# Patient Record
Sex: Female | Born: 1935 | Race: White | Hispanic: No | Smoking: Former smoker
Health system: Southern US, Community
[De-identification: ages and names within clinical notes are randomized; demographics above are authoritative.]

## PROBLEM LIST (undated history)

## (undated) DIAGNOSIS — D649 Anemia, unspecified: Secondary | ICD-10-CM

## (undated) DIAGNOSIS — K219 Gastro-esophageal reflux disease without esophagitis: Secondary | ICD-10-CM

## (undated) DIAGNOSIS — I10 Essential (primary) hypertension: Secondary | ICD-10-CM

## (undated) DIAGNOSIS — K529 Noninfective gastroenteritis and colitis, unspecified: Secondary | ICD-10-CM

## (undated) DIAGNOSIS — E785 Hyperlipidemia, unspecified: Secondary | ICD-10-CM

## (undated) DIAGNOSIS — IMO0001 Reserved for inherently not codable concepts without codable children: Secondary | ICD-10-CM

## (undated) DIAGNOSIS — K5792 Diverticulitis of intestine, part unspecified, without perforation or abscess without bleeding: Secondary | ICD-10-CM

## (undated) DIAGNOSIS — J189 Pneumonia, unspecified organism: Secondary | ICD-10-CM

## (undated) DIAGNOSIS — G459 Transient cerebral ischemic attack, unspecified: Secondary | ICD-10-CM

## (undated) DIAGNOSIS — J45909 Unspecified asthma, uncomplicated: Secondary | ICD-10-CM

## (undated) DIAGNOSIS — J302 Other seasonal allergic rhinitis: Secondary | ICD-10-CM

## (undated) DIAGNOSIS — C189 Malignant neoplasm of colon, unspecified: Secondary | ICD-10-CM

## (undated) DIAGNOSIS — Z9889 Other specified postprocedural states: Secondary | ICD-10-CM

## (undated) DIAGNOSIS — K449 Diaphragmatic hernia without obstruction or gangrene: Secondary | ICD-10-CM

## (undated) DIAGNOSIS — J9611 Chronic respiratory failure with hypoxia: Secondary | ICD-10-CM

## (undated) DIAGNOSIS — M1990 Unspecified osteoarthritis, unspecified site: Secondary | ICD-10-CM

## (undated) DIAGNOSIS — H9190 Unspecified hearing loss, unspecified ear: Secondary | ICD-10-CM

## (undated) DIAGNOSIS — R112 Nausea with vomiting, unspecified: Secondary | ICD-10-CM

## (undated) DIAGNOSIS — H269 Unspecified cataract: Secondary | ICD-10-CM

## (undated) DIAGNOSIS — Z8489 Family history of other specified conditions: Secondary | ICD-10-CM

## (undated) DIAGNOSIS — I82409 Acute embolism and thrombosis of unspecified deep veins of unspecified lower extremity: Secondary | ICD-10-CM

## (undated) DIAGNOSIS — Z87442 Personal history of urinary calculi: Secondary | ICD-10-CM

## (undated) DIAGNOSIS — E119 Type 2 diabetes mellitus without complications: Secondary | ICD-10-CM

## (undated) HISTORY — DX: Other seasonal allergic rhinitis: J30.2

## (undated) HISTORY — DX: Hyperlipidemia, unspecified: E78.5

## (undated) HISTORY — PX: TONSILLECTOMY: SUR1361

## (undated) HISTORY — DX: Unspecified osteoarthritis, unspecified site: M19.90

## (undated) HISTORY — PX: EYE SURGERY: SHX253

## (undated) HISTORY — PX: TOTAL ABDOMINAL HYSTERECTOMY: SHX209

## (undated) HISTORY — PX: CHOLECYSTECTOMY: SHX55

## (undated) HISTORY — PX: JOINT REPLACEMENT: SHX530

## (undated) HISTORY — PX: APPENDECTOMY: SHX54

## (undated) HISTORY — DX: Diaphragmatic hernia without obstruction or gangrene: K44.9

## (undated) HISTORY — DX: Transient cerebral ischemic attack, unspecified: G45.9

## (undated) HISTORY — DX: Malignant neoplasm of colon, unspecified: C18.9

## (undated) HISTORY — DX: Unspecified asthma, uncomplicated: J45.909

## (undated) HISTORY — DX: Diverticulitis of intestine, part unspecified, without perforation or abscess without bleeding: K57.92

## (undated) HISTORY — DX: Essential (primary) hypertension: I10

## (undated) HISTORY — DX: Gastro-esophageal reflux disease without esophagitis: K21.9

---

## 1964-08-12 HISTORY — PX: KIDNEY SURGERY: SHX687

## 1965-08-12 HISTORY — PX: COLON RESECTION: SHX5231

## 1998-06-26 ENCOUNTER — Ambulatory Visit (HOSPITAL_COMMUNITY): Admission: RE | Admit: 1998-06-26 | Discharge: 1998-06-26 | Payer: Self-pay | Admitting: Obstetrics & Gynecology

## 2003-09-28 ENCOUNTER — Ambulatory Visit (HOSPITAL_COMMUNITY): Admission: RE | Admit: 2003-09-28 | Discharge: 2003-09-28 | Payer: Self-pay | Admitting: Urology

## 2012-11-23 ENCOUNTER — Other Ambulatory Visit: Payer: Self-pay | Admitting: *Deleted

## 2014-12-24 ENCOUNTER — Emergency Department (HOSPITAL_COMMUNITY): Payer: Medicare Other

## 2014-12-24 ENCOUNTER — Emergency Department (HOSPITAL_COMMUNITY)
Admission: EM | Admit: 2014-12-24 | Discharge: 2014-12-24 | Disposition: A | Discharge status: Home / Self Care | Payer: Medicare Other | Source: Intra-hospital | Attending: Emergency Medicine | Admitting: Emergency Medicine

## 2014-12-24 ENCOUNTER — Encounter (HOSPITAL_COMMUNITY): Payer: Self-pay | Admitting: *Deleted

## 2014-12-24 DIAGNOSIS — F1110 Opioid abuse, uncomplicated: Secondary | ICD-10-CM | POA: Diagnosis not present

## 2014-12-24 DIAGNOSIS — E119 Type 2 diabetes mellitus without complications: Secondary | ICD-10-CM | POA: Insufficient documentation

## 2014-12-24 DIAGNOSIS — I10 Essential (primary) hypertension: Secondary | ICD-10-CM | POA: Insufficient documentation

## 2014-12-24 DIAGNOSIS — R51 Headache: Secondary | ICD-10-CM

## 2014-12-24 DIAGNOSIS — Z8669 Personal history of other diseases of the nervous system and sense organs: Secondary | ICD-10-CM | POA: Insufficient documentation

## 2014-12-24 DIAGNOSIS — R519 Headache, unspecified: Secondary | ICD-10-CM

## 2014-12-24 HISTORY — DX: Type 2 diabetes mellitus without complications: E11.9

## 2014-12-24 HISTORY — DX: Reserved for inherently not codable concepts without codable children: IMO0001

## 2014-12-24 HISTORY — DX: Unspecified cataract: H26.9

## 2014-12-24 HISTORY — DX: Essential (primary) hypertension: I10

## 2014-12-24 HISTORY — DX: Unspecified hearing loss, unspecified ear: H91.90

## 2014-12-24 LAB — COMPREHENSIVE METABOLIC PANEL
ALK PHOS: 71 U/L (ref 38–126)
ALT: 11 U/L — AB (ref 14–54)
ANION GAP: 8 (ref 5–15)
AST: 20 U/L (ref 15–41)
Albumin: 3.8 g/dL (ref 3.5–5.0)
BUN: 13 mg/dL (ref 6–20)
CO2: 32 mmol/L (ref 22–32)
Calcium: 9 mg/dL (ref 8.9–10.3)
Chloride: 94 mmol/L — ABNORMAL LOW (ref 101–111)
Creatinine, Ser: 0.72 mg/dL (ref 0.44–1.00)
GLUCOSE: 104 mg/dL — AB (ref 65–99)
Potassium: 4.4 mmol/L (ref 3.5–5.1)
Sodium: 134 mmol/L — ABNORMAL LOW (ref 135–145)
TOTAL PROTEIN: 6.7 g/dL (ref 6.5–8.1)
Total Bilirubin: 0.7 mg/dL (ref 0.3–1.2)

## 2014-12-24 LAB — URINALYSIS, ROUTINE W REFLEX MICROSCOPIC
Bilirubin Urine: NEGATIVE
Glucose, UA: NEGATIVE mg/dL
HGB URINE DIPSTICK: NEGATIVE
Ketones, ur: NEGATIVE mg/dL
Nitrite: NEGATIVE
PH: 7 (ref 5.0–8.0)
Protein, ur: NEGATIVE mg/dL
SPECIFIC GRAVITY, URINE: 1.014 (ref 1.005–1.030)
Urobilinogen, UA: 0.2 mg/dL (ref 0.0–1.0)

## 2014-12-24 LAB — I-STAT CHEM 8, ED
BUN: 16 mg/dL (ref 6–20)
CHLORIDE: 91 mmol/L — AB (ref 101–111)
Calcium, Ion: 1.15 mmol/L (ref 1.13–1.30)
Creatinine, Ser: 0.8 mg/dL (ref 0.44–1.00)
Glucose, Bld: 106 mg/dL — ABNORMAL HIGH (ref 65–99)
HEMATOCRIT: 42 % (ref 36.0–46.0)
Hemoglobin: 14.3 g/dL (ref 12.0–15.0)
POTASSIUM: 4.4 mmol/L (ref 3.5–5.1)
SODIUM: 134 mmol/L — AB (ref 135–145)
TCO2: 30 mmol/L (ref 0–100)

## 2014-12-24 LAB — URINE MICROSCOPIC-ADD ON

## 2014-12-24 LAB — CBC
HEMATOCRIT: 39 % (ref 36.0–46.0)
Hemoglobin: 13 g/dL (ref 12.0–15.0)
MCH: 30.2 pg (ref 26.0–34.0)
MCHC: 33.3 g/dL (ref 30.0–36.0)
MCV: 90.5 fL (ref 78.0–100.0)
PLATELETS: 171 10*3/uL (ref 150–400)
RBC: 4.31 MIL/uL (ref 3.87–5.11)
RDW: 14.5 % (ref 11.5–15.5)
WBC: 7.6 10*3/uL (ref 4.0–10.5)

## 2014-12-24 LAB — RAPID URINE DRUG SCREEN, HOSP PERFORMED
Amphetamines: NOT DETECTED
BARBITURATES: NOT DETECTED
Benzodiazepines: NOT DETECTED
Cocaine: NOT DETECTED
Opiates: POSITIVE — AB
Tetrahydrocannabinol: NOT DETECTED

## 2014-12-24 LAB — DIFFERENTIAL
BASOS ABS: 0 10*3/uL (ref 0.0–0.1)
Basophils Relative: 0 % (ref 0–1)
Eosinophils Absolute: 0.1 10*3/uL (ref 0.0–0.7)
Eosinophils Relative: 1 % (ref 0–5)
Lymphocytes Relative: 23 % (ref 12–46)
Lymphs Abs: 1.8 10*3/uL (ref 0.7–4.0)
MONO ABS: 0.6 10*3/uL (ref 0.1–1.0)
MONOS PCT: 8 % (ref 3–12)
Neutro Abs: 5.1 10*3/uL (ref 1.7–7.7)
Neutrophils Relative %: 68 % (ref 43–77)

## 2014-12-24 LAB — PROTIME-INR
INR: 0.99 (ref 0.00–1.49)
Prothrombin Time: 13.2 seconds (ref 11.6–15.2)

## 2014-12-24 LAB — APTT: aPTT: 24 seconds (ref 24–37)

## 2014-12-24 LAB — SEDIMENTATION RATE: SED RATE: 22 mm/h (ref 0–22)

## 2014-12-24 LAB — I-STAT TROPONIN, ED: TROPONIN I, POC: 0.01 ng/mL (ref 0.00–0.08)

## 2014-12-24 LAB — ETHANOL

## 2014-12-24 MED ORDER — DIPHENHYDRAMINE HCL 50 MG/ML IJ SOLN
12.5000 mg | Freq: Once | INTRAMUSCULAR | Status: AC
  Administered 2014-12-24: 12.5 mg via INTRAVENOUS
  Filled 2014-12-24: qty 1

## 2014-12-24 MED ORDER — DEXAMETHASONE SODIUM PHOSPHATE 10 MG/ML IJ SOLN
10.0000 mg | Freq: Once | INTRAMUSCULAR | Status: AC
  Administered 2014-12-24: 10 mg via INTRAVENOUS
  Filled 2014-12-24: qty 1

## 2014-12-24 MED ORDER — SODIUM CHLORIDE 0.9 % IV BOLUS (SEPSIS)
500.0000 mL | Freq: Once | INTRAVENOUS | Status: AC
  Administered 2014-12-24: 500 mL via INTRAVENOUS

## 2014-12-24 MED ORDER — METOCLOPRAMIDE HCL 5 MG/ML IJ SOLN
5.0000 mg | Freq: Once | INTRAMUSCULAR | Status: AC
  Administered 2014-12-24: 5 mg via INTRAVENOUS
  Filled 2014-12-24: qty 2

## 2014-12-24 NOTE — ED Notes (Signed)
Consent signed by pt to retrieve [facility] patient information.

## 2014-12-24 NOTE — ED Notes (Signed)
Received pt from [facility] via EMS with change in speech around 0930 and headache. Pt had nausea and vomiting and given 4 mg of Zofran by EMS.

## 2014-12-24 NOTE — ED Provider Notes (Signed)
CSN: [id number]     Arrival date & time 12/24/14  1339 History   First MD Initiated Contact with Patient 12/24/14 1350     Chief Complaint  Patient presents with  . Code Stroke    An emergency department physician performed an initial assessment on this suspected stroke patient at 1339. (Consider location/radiation/quality/duration/timing/severity/associated sxs/prior Treatment) Patient is a 81 y.o. female presenting with neurologic complaint and headaches. The history is provided by the patient.  Neurologic Problem This is a new problem. The current episode started less than 1 hour ago. The problem occurs constantly. The problem has been resolved. Associated symptoms include headaches. Pertinent negatives include no chest pain, no abdominal pain and no shortness of breath. Nothing aggravates the symptoms. Nothing relieves the symptoms. She has tried nothing for the symptoms. The treatment provided no relief.  Headache Pain location:  Frontal Quality:  Dull Radiates to:  Does not radiate Severity currently:  6/10 Severity at highest:  9/10 Onset quality:  Gradual Duration:  1 week Timing:  Intermittent Progression:  Waxing and waning Chronicity:  New Context comment:  At rest Relieved by:  Nothing Worsened by:  Nothing Ineffective treatments:  NSAIDs Associated symptoms: no abdominal pain, no back pain, no congestion, no cough, no diarrhea, no dizziness, no eye pain, no fatigue, no fever, no nausea, no neck pain and no vomiting     Past Medical History  Diagnosis Date  . Diabetes mellitus without complication   . Hypertension   . Cataract   . Hearing impaired    No past surgical history on file. No family history on file. History  Substance Use Topics  . Smoking status: Not on file  . Smokeless tobacco: Not on file  . Alcohol Use: Not on file   OB History    No data available     Review of Systems  Constitutional: Negative for fever and fatigue.  HENT: Negative for  congestion and drooling.   Eyes: Negative for pain.  Respiratory: Negative for cough and shortness of breath.   Cardiovascular: Negative for chest pain.  Gastrointestinal: Negative for nausea, vomiting, abdominal pain and diarrhea.  Genitourinary: Negative for dysuria and hematuria.  Musculoskeletal: Negative for back pain, gait problem and neck pain.  Skin: Negative for color change.  Neurological: Positive for speech difficulty and headaches. Negative for dizziness.  Hematological: Negative for adenopathy.  Psychiatric/Behavioral: Negative for behavioral problems.  All other systems reviewed and are negative.     Allergies  Codeine; Morphine and related; and Other  Home Medications   Prior to Admission medications   Not on File   Wt 138 lb 7.2 oz (62.8 kg)  SpO2 98% Physical Exam  Constitutional: She is oriented to person, place, and time. She appears well-developed and well-nourished.  HENT:  Head: Normocephalic and atraumatic.  Mouth/Throat: Oropharynx is clear and moist. No oropharyngeal exudate.  Eyes: Conjunctivae and EOM are normal. Pupils are equal, round, and reactive to light.  Neck: Normal range of motion. Neck supple.  Cardiovascular: Normal rate, regular rhythm, normal heart sounds and intact distal pulses.  Exam reveals no gallop and no friction rub.   No murmur heard. Pulmonary/Chest: Effort normal and breath sounds normal. No respiratory distress. She has no wheezes.  Abdominal: Soft. Bowel sounds are normal. There is no tenderness. There is no rebound and no guarding.  Musculoskeletal: Normal range of motion. She exhibits no edema or tenderness.  Neurological: She is alert and oriented to person, place, and time.  Equal strength and sensation in the upper extremity.  Normal sensation in bilateral lower extremity. 5 out of 5 strength in the left lower extremity. 3 out of 5 strength in the right lower extremity which the patient attributes to chronic hip  pain.  Skin: Skin is warm and dry.  Psychiatric: She has a normal mood and affect. Her behavior is normal.  Nursing note and vitals reviewed.   ED Course  Procedures (including critical care time) Labs Review Labs Reviewed  COMPREHENSIVE METABOLIC PANEL - Abnormal; Notable for the following:    Sodium 134 (*)    Chloride 94 (*)    Glucose, Bld 104 (*)    ALT 11 (*)    All other components within normal limits  I-STAT CHEM 8, ED - Abnormal; Notable for the following:    Sodium 134 (*)    Chloride 91 (*)    Glucose, Bld 106 (*)    All other components within normal limits  ETHANOL  PROTIME-INR  APTT  CBC  DIFFERENTIAL  URINE RAPID DRUG SCREEN (HOSP PERFORMED)  URINALYSIS, ROUTINE W REFLEX MICROSCOPIC  SEDIMENTATION RATE  I-STAT TROPOININ, ED    Imaging Review Ct Head Wo Contrast  12/24/2014   CLINICAL DATA:  Severe headache, slurred speech.  EXAM: CT HEAD WITHOUT CONTRAST  TECHNIQUE: Contiguous axial images were obtained from the base of the skull through the vertex without intravenous contrast.  COMPARISON:  CT scan of Dec 21, 2014.  FINDINGS: Bony calvarium appears intact. Minimal diffuse cortical atrophy is noted. Mild chronic ischemic white matter disease is noted. No mass effect or midline shift is noted. Ventricular size is within normal limits. There is no evidence of mass lesion, hemorrhage or acute infarction.  IMPRESSION: Minimal diffuse cortical atrophy. Mild chronic ischemic white matter disease. No acute intracranial abnormality seen. These results were called by telephone at the time of interpretation on 12/24/2014 at 2:04 pm to Dr. Leonel Ramsay, who verbally acknowledged these results.   Electronically Signed   By: Marijo Conception, M.D.   On: 12/24/2014 14:04     EKG Interpretation   Date/Time:  Saturday Dec 24 2014 14:08:22 EDT Ventricular Rate:  67 PR Interval:  158 QRS Duration: 90 QT Interval:  412 QTC Calculation: 435 R Axis:   54 Text Interpretation:   Sinus rhythm Anterior infarct, old No significant  change since last tracing Confirmed by Fergie Sherbert  MD, Tong Pieczynski ([id number]) on  12/24/2014 2:34:06 PM      MDM   Final diagnoses:  Headache, unspecified headache type    2:38 PM 81 y.o. female w hx of DM, HTN, hx of HA's, on 2L [place] at baseline, TIA on aggrenox who presents with waxing waning frontal headaches for the last week and expressive aphasia. She was recently admitted to [facility] several days ago due to some expressive aphasia which developed while sitting at dinner. Her symptoms were brief and seemed to resolve by the time she arrived to the hospital. She did not get an MRI due to a metal pessary that she did not want removed. She states that she awoke last night with this recurring frontal headache that continued throughout this morning. EMS was called due to worsening headache but noted the patient had some mild expressive aphasia. There is no evidence of this on my current exam. She has been evaluated by neurology. We'll get screening labs and imaging. She denies fevers or head injuries. Does have a hx of HA's.   ESR neg.  Doubt temporal arteritis. I discussed the case with Dr. Leonel Ramsay. The patient's headache is resolving with a migraine cocktail. He recommended discontinuing the Aggrenox as it may be provoking the headaches. We'll start her on full dose aspirin daily. Will recommend close follow-up with her neurologist for further evaluation of her headaches. Do not think admission needed given recent stroke workup at [facility].  4:16 PM:  I have discussed the diagnosis/risks/treatment options with the patient and family and believe the pt to be eligible for discharge home to follow-up with her neurologist. We also discussed returning to the ED immediately if new or worsening sx occur. We discussed the sx which are most concerning (e.g., weakness, numbness, fever, worsening HA's) that necessitate immediate return.  Medications administered to the patient during their visit and any new prescriptions provided to the patient are listed below.  Medications given during this visit Medications  sodium chloride 0.9 % bolus 500 mL (500 mLs Intravenous New Bag/Given 12/24/14 1444)  metoCLOPramide (REGLAN) injection 5 mg (5 mg Intravenous Given 12/24/14 1446)  diphenhydrAMINE (BENADRYL) injection 12.5 mg (12.5 mg Intravenous Given 12/24/14 1444)  dexamethasone (DECADRON) injection 10 mg (10 mg Intravenous Given 12/24/14 1555)    New Prescriptions   No medications on file     Pamella Pert, MD 12/24/14 1621

## 2014-12-24 NOTE — ED Notes (Signed)
Pt placed on 3L of o2

## 2014-12-24 NOTE — Significant Event (Signed)
Rapid Response Event Note  Overview:  Called to see patient for Code Stroke.     Initial Focused Assessment: Upon arrival patient is alert and oriented in no apparent distress.  Interventions: Assisted with CT scan; NIHSS 3, but right leg with 'hip problems' and neuropathy, and patient unable to sustain elevation off bed. Mild sensory alteration with left face. LSN 0930, out of treatement window. Initial reported symptom of speech/word finding ability appears resolved. Still complains of headache, varying intensity.  Event Summary: Will assist as needed.   at      at          Baron Hamper

## 2014-12-24 NOTE — Consult Note (Addendum)
Neurology Consultation Reason for Consult: Speech difficulty Referring Physician:   CC: Speech  History is obtained from:Patient  HPI: [name] is a 81 y.o. female with transient speech difficulty two days ago treated as TIA at [facility] who presents with headache this morning. She states that there was a possibility that she might of had some difficulty with speech, but she feels it is more that she just had a severe headache and did not want to talk. She complains that she has been having headaches more over the past few months, but of note she was changed to Aggrenox and just started this medication.   LKW: 9:30 am tpa given?: no, outside 3 hours window.     ROS: A 14 point ROS was performed and is negative except as noted in the HPI.   PMH: TIA Diabetes Hypertension Cataracts Essential tremor  Family History: No history of stroke  Social History: Tob: Denies  Exam: Current vital signs: Filed Vitals:   12/24/14 1530  BP: 159/58  Pulse: 63  Resp: 15    Vital signs in last 24 hours:    Physical Exam  Constitutional: Appears well-developed and well-nourished.  Psych: Affect appropriate to situation Eyes: No scleral injection HENT: No OP obstrucion Head: Normocephalic.  Cardiovascular: Normal rate and regular rhythm.  Respiratory: Effort normal and breath sounds normal to anterior ascultation GI: Soft.  No distension. There is no tenderness.  Skin: WDI  Neuro: Mental Status: Patient is awake, alert, oriented to person, place, month, year, and situation. Patient is able to give a clear and coherent history. No signs of aphasia or neglect Cranial Nerves: II: Visual Fields are full. Pupils are equal, round, and reactive to light.   III,IV, VI: EOMI without ptosis or diploplia.  V: Facial sensation is symmetric to temperature VII: Facial movement is symmetric.  VIII: hearing is intact to voice X: Uvula elevates symmetrically XI: Shoulder shrug is  symmetric. XII: tongue is midline without atrophy or fasciculations.  Motor: Tone is normal. Bulk is normal. 5/5 strength was present in all four extremities.  Sensory: Sensation is symmetric to light touch and temperature in the arms and legs. Deep Tendon Reflexes: 2+ and symmetric in the biceps and patellae.  Plantars: Toes are downgoing bilaterally.  Cerebellar: She has essential tremor bilaterally, but no dysmetria    I have reviewed labs in epic and the results pertinent to this consultation are: cmp - unremarkable  I have reviewed the images obtained:CT head - unremarkable.   Impression: 81 yo F with severe headache following aggrenox initiation. She is not sure if her speech was just caused by pain or was something else. With just having had a TIA workup and admission several days ago, I'm not sure if there be much benefit of performing another admission for that. I do think that the severe headache is likely Aggrenox, and she may need a different antiplatelet therapy in the interim. She has had problems with Plavix in the past, and therefore I would choose aspirin monotherapy  One concern that does need to be investigated this with these headaches, and a 81 year old she needs to be evaluated for temporal arteritis I would recommend a sedimentation rate.  Recommendations: 1) sed rate   Roland Rack, MD [facility] [phone]  If 7pm- 7am, please page neurology on call as listed in [place].  She does not have any elevation in her sedimentation rate, I suspect that this is all due to Aggrenox. I would have  her stop Aggrenox and start aspirin 325 mg daily. She can follow up with her primary neurologist.   Roland Rack, MD [facility] [phone]  If 7pm- 7am, please page neurology on call as listed in [place].

## 2014-12-25 LAB — URINE CULTURE: Colony Count: >=100000

## 2014-12-26 ENCOUNTER — Telehealth (HOSPITAL_COMMUNITY): Payer: Self-pay

## 2014-12-26 NOTE — ED Notes (Signed)
Post ED Visit - Positive Culture Follow-up  Culture report reviewed by antimicrobial stewardship pharmacist: []  Wes Dulaney, Pharm.D., BCPS [x]  Heide Guile, Pharm.D., BCPS []  Alycia Rossetti, Pharm.D., BCPS []  [place], Pharm.D., BCPS, AAHIVP []  Legrand Como, Pharm.D., BCPS, AAHIVP []  Isac Sarna, Pharm.D., BCPS  Positive urine culture Treated with aztihromycin, organism sensitive to the same and no further patient follow-up is required at this time.  Ileene Musa 12/26/2014, 11:38 AM

## 2015-10-25 LAB — HM DIABETES EYE EXAM

## 2016-02-23 LAB — CBC AND DIFFERENTIAL
HCT: 41 (ref 36–46)
Hemoglobin: 13.6 (ref 12.0–16.0)
NEUTROS ABS: 5
PLATELETS: 214 (ref 150–399)
WBC: 7

## 2016-02-23 LAB — LIPID PANEL
Cholesterol: 204 — AB (ref 0–200)
HDL: 72 — AB (ref 35–70)
LDL Cholesterol: 111
Triglycerides: 103 (ref 40–160)

## 2016-02-23 LAB — TSH: TSH: 1.79 (ref 0.41–5.90)

## 2016-02-23 LAB — BASIC METABOLIC PANEL
BUN: 11 (ref 4–21)
CREATININE: 0.7 (ref 0.5–1.1)
Potassium: 4.6 (ref 3.4–5.3)
Sodium: 138 (ref 137–147)

## 2016-02-23 LAB — HEPATIC FUNCTION PANEL
ALK PHOS: 72 (ref 25–125)
ALT: 11 (ref 7–35)
AST: 21 (ref 13–35)
BILIRUBIN, TOTAL: 0.4

## 2016-02-23 LAB — VITAMIN D 25 HYDROXY (VIT D DEFICIENCY, FRACTURES): VIT D 25 HYDROXY: 96.2

## 2016-02-23 LAB — HEMOGLOBIN A1C: HEMOGLOBIN A1C: 5.3

## 2017-01-23 ENCOUNTER — Encounter: Payer: Self-pay | Admitting: Adult Health

## 2017-01-23 ENCOUNTER — Telehealth: Payer: Self-pay | Admitting: Adult Health

## 2017-01-23 ENCOUNTER — Ambulatory Visit (INDEPENDENT_AMBULATORY_CARE_PROVIDER_SITE_OTHER): Payer: Medicare Other | Admitting: Adult Health

## 2017-01-23 VITALS — BP 156/70 | Temp 98.1°F | Wt 125.0 lb

## 2017-01-23 DIAGNOSIS — Z760 Encounter for issue of repeat prescription: Secondary | ICD-10-CM

## 2017-01-23 DIAGNOSIS — O2230 Deep phlebothrombosis in pregnancy, unspecified trimester: Secondary | ICD-10-CM

## 2017-01-23 DIAGNOSIS — M25551 Pain in right hip: Secondary | ICD-10-CM

## 2017-01-23 DIAGNOSIS — J45909 Unspecified asthma, uncomplicated: Secondary | ICD-10-CM

## 2017-01-23 DIAGNOSIS — E782 Mixed hyperlipidemia: Secondary | ICD-10-CM

## 2017-01-23 DIAGNOSIS — I10 Essential (primary) hypertension: Secondary | ICD-10-CM | POA: Diagnosis not present

## 2017-01-23 DIAGNOSIS — C189 Malignant neoplasm of colon, unspecified: Secondary | ICD-10-CM

## 2017-01-23 DIAGNOSIS — H9193 Unspecified hearing loss, bilateral: Secondary | ICD-10-CM

## 2017-01-23 DIAGNOSIS — K219 Gastro-esophageal reflux disease without esophagitis: Secondary | ICD-10-CM | POA: Diagnosis not present

## 2017-01-23 DIAGNOSIS — M1990 Unspecified osteoarthritis, unspecified site: Secondary | ICD-10-CM | POA: Diagnosis not present

## 2017-01-23 DIAGNOSIS — H269 Unspecified cataract: Secondary | ICD-10-CM | POA: Diagnosis not present

## 2017-01-23 DIAGNOSIS — J301 Allergic rhinitis due to pollen: Secondary | ICD-10-CM

## 2017-01-23 DIAGNOSIS — I82409 Acute embolism and thrombosis of unspecified deep veins of unspecified lower extremity: Secondary | ICD-10-CM

## 2017-01-23 DIAGNOSIS — E119 Type 2 diabetes mellitus without complications: Secondary | ICD-10-CM | POA: Diagnosis not present

## 2017-01-23 DIAGNOSIS — J449 Chronic obstructive pulmonary disease, unspecified: Secondary | ICD-10-CM | POA: Diagnosis not present

## 2017-01-23 DIAGNOSIS — G459 Transient cerebral ischemic attack, unspecified: Secondary | ICD-10-CM

## 2017-01-23 DIAGNOSIS — K5792 Diverticulitis of intestine, part unspecified, without perforation or abscess without bleeding: Secondary | ICD-10-CM

## 2017-01-23 DIAGNOSIS — Z7689 Persons encountering health services in other specified circumstances: Secondary | ICD-10-CM

## 2017-01-23 MED ORDER — LOSARTAN POTASSIUM 25 MG PO TABS: 12.5000 mg | ORAL_TABLET | Freq: Every day | ORAL | 0 refills | Status: DC

## 2017-01-23 MED ORDER — HYDROCODONE-ACETAMINOPHEN 5-325 MG PO TABS: 1.0000 | ORAL_TABLET | Freq: Every day | ORAL | 0 refills | Status: DC

## 2017-01-23 NOTE — Telephone Encounter (Signed)
Medication list has been corrected and Rx has been sent to correct pharmacy. Thank you!

## 2017-01-23 NOTE — Patient Instructions (Addendum)
It was great meeting you today   Please follow up with me in one month   I have sent in a new prescription for Cozaar, take 1/2 pill daily

## 2017-01-23 NOTE — Telephone Encounter (Signed)
°  Daughter said Mom no loner takes [facility] and that need to be removed and that plavix 75mg  need to be added   Daughter said Mom medicine was called into the wrong pharmacy.   Correct pharmacy [facility] [address]

## 2017-01-23 NOTE — Progress Notes (Signed)
Patient presents to clinic today to establish care. She is a pleasant 83 year old female who  has a past medical history of Cataract; Diabetes mellitus without complication ([place]); Hearing impaired; and Hypertension.   Her last physical was in April 2016    Acute Concerns: Establish Care   Chronic Issues:   Essential Hypertension - Inderal 20 mg BID and lisinopril 40mg . She has been on Losartan in the past but was taken off it due to hypotension. Today while she was at neurology her BP was 190/80. Family reports that she has had recent blood pressure readings of 170/60, 160/70,169/80,170/70   Colon Cancer - Had a colon resection in 1997. Last colonoscopy was in 2017 while in the hospital for " colon inflammation".   Diabetes - Unknown when she was diagnosed. Has never taken any medication for diabetes. She does not follow a diabetic diet.  TIA's - had two in May of 2016. She was followed by Neurology in [place] but was released today    Health Maintenance: Dental -- Does not do routine care  Vision -- [place]  Immunizations -- UTD Colonoscopy -- 2017  Mammogram -- No longer needed PAP -- No longer needed Bone Density -- Unknown   Is followed   Pulmonary - COPD for pseudomonas. She is being seen by a pulmonologist in [facility]. She would like to switch to someone within [facility]   Neurologist - For TIA's, tremors, and neuropathy - she was released today by her neurologist  Past Medical History:  Diagnosis Date  . Cataract   . Diabetes mellitus without complication ([place])   . Hearing impaired   . Hypertension     No past surgical history on file.  Current Outpatient Prescriptions on File Prior to Visit  Medication Sig Dispense Refill  . acetaminophen (TYLENOL) 500 MG tablet Take 500 mg by mouth daily.    Marland Kitchen aspirin EC 81 MG tablet Take 81 mg by mouth daily. Take 1 tablet by mouth every Sunday and Thursday only.    . Calcium Carbonate-Vit D-Min (CALCIUM 600+D PLUS  MINERALS) 600-400 MG-UNIT CHEW Chew 2 tablets by mouth daily.    . Cholecalciferol (VITAMIN D3) 10000 UNITS capsule Take 10,000 Units by mouth See admin instructions. Take 1 capsule (10,000 units) Monday thru Thursday    . ferrous sulfate 325 (65 FE) MG tablet Take 325 mg by mouth daily with breakfast. Take Monday, Wednesday, and Friday  3  . fluticasone (FLONASE) 50 MCG/ACT nasal spray Place 1 spray into both nostrils 2 (two) times daily.   6  . Fluticasone-Salmeterol (ADVAIR) 250-50 MCG/DOSE AEPB Inhale 1 puff into the lungs 2 (two) times daily.    Marland Kitchen gabapentin (NEURONTIN) 100 MG capsule Take 100 mg by mouth at bedtime.   4  . lisinopril (PRINIVIL,ZESTRIL) 40 MG tablet Take 40 mg by mouth daily.    Marland Kitchen loratadine (CLARITIN) 10 MG tablet Take 10 mg by mouth daily.     . ondansetron (ZOFRAN) 4 MG tablet Take 4 mg by mouth every 8 (eight) hours as needed for nausea or vomiting.     . polyethylene glycol powder (GLYCOLAX/MIRALAX) powder Take 17 g by mouth daily as needed (constipation). Mix in 8 oz liquid and drink    . primidone (MYSOLINE) 50 MG tablet Take 25 mg by mouth 2 (two) times daily.   2  . Probiotic Product (PROBIOTIC PO) Take 1 capsule by mouth daily.    . propranolol (INDERAL) 20  MG tablet Take 20 mg by mouth 2 (two) times daily.   1  . albuterol (PROVENTIL HFA;VENTOLIN HFA) 108 (90 BASE) MCG/ACT inhaler Inhale 1 puff into the lungs every 4 (four) hours as needed for wheezing or shortness of breath.     . dexlansoprazole (DEXILANT) 60 MG capsule Take 60 mg by mouth daily.      No current facility-administered medications on file prior to visit.     Allergies  Allergen Reactions  . Celecoxib Nausea And Vomiting and Other (See Comments)    Body aches  . Codeine Nausea And Vomiting    Tolerates hydrocodone  . Fish Oil Nausea And Vomiting  . Fosamax [Alendronate Sodium] Nausea And Vomiting  . Morphine And Related Nausea And Vomiting  . Moxifloxacin Other (See Comments)    fatigue    . Oxycodone Nausea And Vomiting    Tolerates hydrocodone  . Pregabalin Swelling  . Propoxyphene Nausea And Vomiting  . Statins Other (See Comments)    Muscle pain    No family history on file.  Social History   Social History  . Marital status: Widowed    Spouse name: N/A  . Number of children: N/A  . Years of education: N/A   Occupational History  . Not on file.   Social History Main Topics  . Smoking status: Never Smoker  . Smokeless tobacco: Never Used  . Alcohol use No  . Drug use: No  . Sexual activity: Not on file   Other Topics Concern  . Not on file   Social History Narrative  . No narrative on file    Review of Systems  Constitutional: Negative.   HENT: Negative.   Eyes: Negative.   Respiratory: Negative.   Cardiovascular: Negative.   Genitourinary: Negative.   Musculoskeletal: Positive for joint pain. Negative for myalgias.  Skin: Negative.   Neurological: Negative.   All other systems reviewed and are negative.   BP (!) 156/70 (BP Location: Left Arm, Patient Position: Sitting, Cuff Size: Normal)   Temp 98.1 F (36.7 C) (Oral)   Wt 125 lb (56.7 kg)   Physical Exam  Constitutional: She is oriented to person, place, and time and well-developed, well-nourished, and in no distress. No distress.  Neck: Normal range of motion. Neck supple. No thyromegaly present.  Cardiovascular: Normal rate, regular rhythm, normal heart sounds and intact distal pulses.  Exam reveals no gallop and no friction rub.   No murmur heard. Pulmonary/Chest: Effort normal and breath sounds normal. No respiratory distress. She has no wheezes. She has no rales. She exhibits no tenderness.  Abdominal: Soft. Bowel sounds are normal. She exhibits no distension and no mass. There is no tenderness. There is no rebound and no guarding. A hernia is present. Hernia confirmed positive in the ventral area.  Musculoskeletal:  Right hip pain with ROM   Lymphadenopathy:    She has no  cervical adenopathy.  Neurological: She is alert and oriented to person, place, and time. Gait normal. GCS score is 15.  Skin: Skin is warm and dry. No rash noted. She is not diaphoretic. No erythema. No pallor.  Psychiatric: Mood, memory, affect and judgment normal.  Nursing note and vitals reviewed.   Assessment/Plan: 1. Encounter to establish care - Follow up in April for yearly follow up exam and MWE   2. Chronic obstructive pulmonary disease, unspecified COPD type ([place])  - Ambulatory referral to Pulmonology  3. Essential hypertension  - losartan (COZAAR) 25 MG tablet; Take  0.5 tablets (12.5 mg total) by mouth daily.  Dispense: 45 tablet; Refill: 0  4. Right hip pain  - Ambulatory referral to Sports Medicine  5. Medication refill  - gabapentin (NEURONTIN) 100 MG capsule; Take 1 capsule (100 mg total) by mouth at bedtime.  Dispense: 90 capsule; Refill: 1 - clopidogrel (PLAVIX) 75 MG tablet; Take 1 tablet (75 mg total) by mouth daily.  Dispense: 90 tablet; Refill: 3   [name], NP

## 2017-01-24 ENCOUNTER — Encounter: Payer: Self-pay | Admitting: Adult Health

## 2017-01-27 ENCOUNTER — Telehealth: Payer: Self-pay | Admitting: Adult Health

## 2017-01-27 NOTE — Telephone Encounter (Signed)
Pt Bp was elevated  214/94 01/27/17 4:27 not symptoms or blurred vision. You can call the daughter which is power of attorney if you would like at [name] at  [phone].

## 2017-01-27 NOTE — Telephone Encounter (Signed)
Spoke with pt's daughter, Juliann Pulse, and she reports that OT therapist had visit with pt today and noted her BP was elevated at 214/94 and then 208/88. She states that pt denied headache, vision changes or any other cardiac s/s. Advised daughter to have pt check BP tonight and again in the morning. She is wondering if any medications need to be adjusted. Pt has been helping to clean out and pack her house/belongings so daughter wonders if this stress may be contributing.   Pt has apppt with Dr. Paulla Fore @ 10:30, so held your 9:30 in case you would like to see her.  Cory - Please advise. Thanks!

## 2017-01-28 ENCOUNTER — Encounter: Payer: Self-pay | Admitting: Family Medicine

## 2017-01-28 ENCOUNTER — Ambulatory Visit (INDEPENDENT_AMBULATORY_CARE_PROVIDER_SITE_OTHER): Payer: Medicare Other | Admitting: Sports Medicine

## 2017-01-28 ENCOUNTER — Ambulatory Visit: Payer: Self-pay

## 2017-01-28 ENCOUNTER — Encounter: Payer: Self-pay | Admitting: Adult Health

## 2017-01-28 ENCOUNTER — Ambulatory Visit (INDEPENDENT_AMBULATORY_CARE_PROVIDER_SITE_OTHER): Payer: Medicare Other

## 2017-01-28 ENCOUNTER — Encounter: Payer: Self-pay | Admitting: Sports Medicine

## 2017-01-28 ENCOUNTER — Other Ambulatory Visit: Payer: Self-pay | Admitting: Adult Health

## 2017-01-28 VITALS — BP 164/78 | HR 65 | Ht 64.5 in | Wt 125.2 lb

## 2017-01-28 DIAGNOSIS — I10 Essential (primary) hypertension: Secondary | ICD-10-CM | POA: Insufficient documentation

## 2017-01-28 DIAGNOSIS — H269 Unspecified cataract: Secondary | ICD-10-CM | POA: Insufficient documentation

## 2017-01-28 DIAGNOSIS — M545 Low back pain: Secondary | ICD-10-CM

## 2017-01-28 DIAGNOSIS — K219 Gastro-esophageal reflux disease without esophagitis: Secondary | ICD-10-CM | POA: Insufficient documentation

## 2017-01-28 DIAGNOSIS — O2230 Deep phlebothrombosis in pregnancy, unspecified trimester: Secondary | ICD-10-CM | POA: Insufficient documentation

## 2017-01-28 DIAGNOSIS — G459 Transient cerebral ischemic attack, unspecified: Secondary | ICD-10-CM | POA: Insufficient documentation

## 2017-01-28 DIAGNOSIS — M47816 Spondylosis without myelopathy or radiculopathy, lumbar region: Secondary | ICD-10-CM | POA: Diagnosis not present

## 2017-01-28 DIAGNOSIS — M25551 Pain in right hip: Secondary | ICD-10-CM

## 2017-01-28 DIAGNOSIS — M1611 Unilateral primary osteoarthritis, right hip: Secondary | ICD-10-CM | POA: Diagnosis not present

## 2017-01-28 DIAGNOSIS — E119 Type 2 diabetes mellitus without complications: Secondary | ICD-10-CM | POA: Insufficient documentation

## 2017-01-28 DIAGNOSIS — C189 Malignant neoplasm of colon, unspecified: Secondary | ICD-10-CM | POA: Insufficient documentation

## 2017-01-28 DIAGNOSIS — M1990 Unspecified osteoarthritis, unspecified site: Secondary | ICD-10-CM | POA: Insufficient documentation

## 2017-01-28 DIAGNOSIS — K5792 Diverticulitis of intestine, part unspecified, without perforation or abscess without bleeding: Secondary | ICD-10-CM | POA: Insufficient documentation

## 2017-01-28 DIAGNOSIS — J45909 Unspecified asthma, uncomplicated: Secondary | ICD-10-CM | POA: Insufficient documentation

## 2017-01-28 DIAGNOSIS — J449 Chronic obstructive pulmonary disease, unspecified: Secondary | ICD-10-CM | POA: Insufficient documentation

## 2017-01-28 DIAGNOSIS — E785 Hyperlipidemia, unspecified: Secondary | ICD-10-CM | POA: Insufficient documentation

## 2017-01-28 DIAGNOSIS — J302 Other seasonal allergic rhinitis: Secondary | ICD-10-CM | POA: Insufficient documentation

## 2017-01-28 DIAGNOSIS — H9190 Unspecified hearing loss, unspecified ear: Secondary | ICD-10-CM | POA: Insufficient documentation

## 2017-01-28 MED ORDER — GABAPENTIN 100 MG PO CAPS: 100.0000 mg | ORAL_CAPSULE | Freq: Every day | ORAL | 1 refills | Status: DC

## 2017-01-28 MED ORDER — CLOPIDOGREL BISULFATE 75 MG PO TABS: 75.0000 mg | ORAL_TABLET | Freq: Every day | ORAL | 3 refills | Status: AC

## 2017-01-28 NOTE — Assessment & Plan Note (Signed)
Severe Degenerative Changes -she is not a great candidate for total hip arthroplasty however she does appear to be beginning to ankylosed the hip and if persistent or worsening range of motion would need to consider seeing Dr. Ninfa Linden.    PROCEDURE NOTE -  ULTRASOUND GUIDEDINJECTION: Right Hip Images were obtained and interpreted by myself, Teresa Coombs, DO  Images have been saved and stored to PACS system. Images obtained on: GE S7 Ultrasound machine  ULTRASOUND FINDINGS: Severe Degenerative changes with effusion  DESCRIPTION OF PROCEDURE:  The patient's clinical condition is marked by substantial pain and/or significant functional disability. Other conservative therapy has not provided relief, is contraindicated, or not appropriate. There is a reasonable likelihood that injection will significantly improve the patient's pain and/or functional impairment. After discussing the risks, benefits and expected outcomes of the injection and all questions were reviewed and answered, the patient wished to undergo the above named procedure. Verbal consent was obtained. The ultrasound was used to identify the target structure and adjacent neurovascular structures. The skin was then prepped in sterile fashion and the target structure was injected under direct visualization using sterile technique as below: PREP: Alcohol, Ethel Chloride,  APPROACH: Anterior, sterile stopcock Technique, 21 g 2" needle INJECTATE: 3cc 1% Lidocaine, 2 cc 0.5% marcaine, 2cc 40mg  DepoMedrol ASPIRATE: N/A DRESSING: Band-Aid  Post procedural instructions including recommending icing and warning signs for infection were reviewed. This procedure was well tolerated and there were no complications.   IMPRESSION: Succesful US Guided Injection

## 2017-01-28 NOTE — Telephone Encounter (Signed)
She can go up to a whole pill of Coreg and she if she stabilizes. Follow up if no improvement in the next 24 hours

## 2017-01-28 NOTE — Telephone Encounter (Signed)
Pt's daughter called to advise that BP readings today have been 164/78 and 178/84. She did give pt a whole tablet of Losartan this morning. She would like to know what your recommendations are.   Cory - Please advise. Thanks!

## 2017-01-28 NOTE — Telephone Encounter (Signed)
LMTCB

## 2017-01-28 NOTE — Patient Instructions (Signed)

## 2017-01-28 NOTE — Telephone Encounter (Signed)
Check tomorrow morning and let us know what her BP is

## 2017-01-28 NOTE — Telephone Encounter (Signed)
Spoke with pt's daughter and advised to increase Losartan. She will give this to pt this morning and monitor BP over next 24 hours. She will call with report of readings. She does state that pt's BP has come down to 175/90 this morning. Nothing further needed at this time.

## 2017-01-28 NOTE — Progress Notes (Signed)
OFFICE VISIT NOTE [name]. [name], [place] [facility]  [name] - 83 y.o. female MRN [id number]  Date of birth: Jul 05, 1921  Visit Date: 01/28/2017  PCP: [name], NP   Referred by: [name], NP  [name], CMA acting as scribe for Dr. Paulla Fore.  SUBJECTIVE:   Chief Complaint  Patient presents with  . right hip pain   HPI: As below and per problem based documentation when appropriate.  Pt presents today with complaint of right hip pain.  Pain has been present for several years but has recently gotten worse.  She last had xray of the hip and lower about about 6-7 years ago. She was told that she has osteoarthritis and its bone on bone. She has degenerative disc disease of the lumbar spine. She reports that the hip pain is lateral but extends to her lower back and groin. She also notes that she has a hernia in her groin and groin pain may be associated with that. She has been doing PT to strengthen her quads and other muscles around the knee. She says that she is pretty sore the next day. She has been doing PT x 1-2 weeks with [facility] at [facility].   The pain is described as "crunching" and "grabbing" pain and is rated as 0/10 when sitting but 8/10 with movement.  Worsened with walking, bearing weight, lifting the right leg. She also has a lot of pain when turning over in bed. She feels that when she walks she has to turn her foot a certain way to decrease the pain with movement. She takes Hydrocodone at bedtime to help with the pain. She gets some relief with this but still wakes up in pain when she rolls over in her sleep.  Therapies tried include : Biofreeze, ice, heat; she has gotten some relief from these therapies.   Other associated symptoms include: low back pain, knee pain. Pt also reports that she has neuropathy which makes doing PT difficult.     Review of Systems  Constitutional: Negative  for chills and fever.  Respiratory: Positive for shortness of breath (COPD). Negative for wheezing.   Cardiovascular: Positive for leg swelling. Negative for chest pain and palpitations.  Neurological: Positive for dizziness and tingling. Negative for headaches.  Endo/Heme/Allergies: Bruises/bleeds easily (on Plavix and Aspirin).    Otherwise per HPI.  HISTORY & PERTINENT PRIOR DATA:  No specialty comments available. She reports that she has never smoked. She has never used smokeless tobacco. No results for input(s): HGBA1C, LABURIC in the last 8760 hours. Medications & Allergies reviewed per EMR Patient Active Problem List   Diagnosis Date Noted  . Lumbar spondylosis 01/28/2017  . Primary osteoarthritis of right hip 01/28/2017  . Arthritis   . Asthma   . Cataract   . Colon cancer ([place])   . COPD (chronic obstructive pulmonary disease) ([place])   . Diabetes mellitus without complication ([place])   . Diverticulitis   . DVT (deep vein thrombosis) in pregnancy ([place])   . Essential hypertension   . GERD (gastroesophageal reflux disease)   . Hearing impaired   . Hyperlipidemia   . Hypertension   . TIA (transient ischemic attack)   . Seasonal allergies    Past Medical History:  Diagnosis Date  . Arthritis   . Asthma   . Cataract   . Colon cancer ([place])   . COPD (chronic obstructive pulmonary disease) ([place])   .  Diabetes mellitus without complication ([place])   . Diverticulitis   . DVT (deep vein thrombosis) in pregnancy ([place])   . Essential hypertension   . GERD (gastroesophageal reflux disease)   . Hearing impaired   . Hiatal hernia   . Hyperlipidemia   . Hypertension   . Seasonal allergies   . TIA (transient ischemic attack)    No family history on file. Past Surgical History:  Procedure Laterality Date  . APPENDECTOMY    . CHOLECYSTECTOMY    . COLON RESECTION  1967  . KIDNEY SURGERY  1966   tumor   . TONSILLECTOMY    . TOTAL ABDOMINAL HYSTERECTOMY     Social History    Occupational History  . Not on file.   Social History Main Topics  . Smoking status: Never Smoker  . Smokeless tobacco: Never Used  . Alcohol use No  . Drug use: No  . Sexual activity: Not on file    OBJECTIVE:  VS:  HT:5' 4.5" (163.8 cm)   WT:125 lb 3.2 oz (56.8 kg)  BMI:21.2    BP:(!) 164/78  HR:65bpm  TEMP: ( )  RESP:93 % EXAM: Findings:  WDWN, NAD, Non-toxic appearing Alert & appropriately interactive Not depressed or anxious appearing No increased work of breathing. Pupils are equal. EOM intact without nystagmus No clubbing or cyanosis of the extremities appreciated No significant rashes/lesions/ulcerations overlying the examined area. DP & PT pulses 2+/4.  No significant pretibial edema. Sensation intact to light touch in lower extremities.  Back and hip: Pain with straight leg raise which is minimal.  She has marked limitations in her hip range of motion with 30-40 of hip flexion and extension with essentially 0 internal and external rotation.  She has marked crepitation with any type of hip motion.  Back is only mildly tender.  No significant neural tension signs.      Dg Lumbar Spine 2-3 Views  Result Date: 01/28/2017 CLINICAL DATA:  Chronic low back pain, initial encounter EXAM: LUMBAR SPINE - 2-3 VIEW COMPARISON:  None. FINDINGS: Five lumbar type vertebral bodies are well visualized. Vertebral body height is well maintained. Mild osteophytic changes are seen. Facet hypertrophic changes are noted. Diffuse aortic calcifications are seen. No acute bony abnormality is noted. Diffuse fecal material is noted throughout colon consistent with a degree of constipation. IMPRESSION: Degenerative change of the lumbar spine. Constipation. Electronically Signed   By: Inez Catalina M.D.   On: 01/28/2017 11:40   Dg Hip Unilat W Or W/o Pelvis 2-3 Views Right  Result Date: 01/28/2017 CLINICAL DATA:  Chronic low back pain and right hip pain common no known injury, initial  encounter EXAM: DG HIP (WITH OR WITHOUT PELVIS) 2-3V RIGHT COMPARISON:  None. FINDINGS: Significant degenerative changes of the right hip joint are noted with remodeling of the femoral head as well as the acetabulum. Subchondral sclerosis and cyst formation is noted. The pelvic ring is intact. Mild degenerative change of the left hip joint is seen. IMPRESSION: Severe osteoarthritic changes of the right hip joint. No acute abnormality noted. Electronically Signed   By: Inez Catalina M.D.   On: 01/28/2017 11:39   ASSESSMENT & PLAN:   Problem List Items Addressed This Visit    Lumbar spondylosis   Primary osteoarthritis of right hip    Severe Degenerative Changes -she is not a great candidate for total hip arthroplasty however she does appear to be beginning to ankylosed the hip and if persistent or worsening range of motion would  need to consider seeing Dr. Ninfa Linden.    PROCEDURE NOTE -  ULTRASOUND GUIDEDINJECTION: Right Hip Images were obtained and interpreted by myself, Teresa Coombs, DO  Images have been saved and stored to PACS system. Images obtained on: GE S7 Ultrasound machine  ULTRASOUND FINDINGS: Severe Degenerative changes with effusion  DESCRIPTION OF PROCEDURE:  The patient's clinical condition is marked by substantial pain and/or significant functional disability. Other conservative therapy has not provided relief, is contraindicated, or not appropriate. There is a reasonable likelihood that injection will significantly improve the patient's pain and/or functional impairment. After discussing the risks, benefits and expected outcomes of the injection and all questions were reviewed and answered, the patient wished to undergo the above named procedure. Verbal consent was obtained. The ultrasound was used to identify the target structure and adjacent neurovascular structures. The skin was then prepped in sterile fashion and the target structure was injected under direct visualization  using sterile technique as below: PREP: Alcohol, Ethel Chloride,  APPROACH: Anterior, sterile stopcock Technique, 21 g 2" needle INJECTATE: 3cc 1% Lidocaine, 2 cc 0.5% marcaine, 2cc 40mg  DepoMedrol ASPIRATE: N/A DRESSING: Band-Aid  Post procedural instructions including recommending icing and warning signs for infection were reviewed. This procedure was well tolerated and there were no complications.   IMPRESSION: Succesful US Guided Injection           Other Visit Diagnoses    Right hip pain    -  Primary   Relevant Orders   DG HIP UNILAT W OR W/O PELVIS 2-3 VIEWS RIGHT (Completed)   US GUIDED NEEDLE PLACEMENT(NO LINKED CHARGES)   Low back pain, unspecified back pain laterality, unspecified chronicity, with sciatica presence unspecified       Relevant Orders   DG Lumbar Spine 2-3 Views (Completed)      Follow-up: No Follow-up on file.   CMA/ATC served as Education administrator during this visit. History, Physical, and Plan performed by medical provider. Documentation and orders reviewed and attested to.      Teresa Coombs, [facility] Physician

## 2017-01-28 NOTE — Telephone Encounter (Signed)
Spoke with pt's daughter and advised. She will call tomorrow with readings.

## 2017-01-29 NOTE — Telephone Encounter (Signed)
Spoke with pt's daughter and advised. She will increase dose and call with readings on Friday.

## 2017-01-29 NOTE — Telephone Encounter (Signed)
We are making improvement.   Go to 1.5 pills and let us know on Friday   Thanks

## 2017-01-29 NOTE — Telephone Encounter (Signed)
Ok to refill 

## 2017-01-29 NOTE — Telephone Encounter (Signed)
Ok to refill for 6 months 

## 2017-01-29 NOTE — Telephone Encounter (Signed)
Spoke to pt's daughter and she reports the following BP readings: Last night 150/83 10am 162/105 10:45am 161/79  Cory - Please advise. Thanks!

## 2017-01-30 ENCOUNTER — Emergency Department (HOSPITAL_COMMUNITY): Payer: Medicare Other

## 2017-01-30 ENCOUNTER — Observation Stay (HOSPITAL_COMMUNITY): Payer: Medicare Other

## 2017-01-30 ENCOUNTER — Observation Stay (HOSPITAL_COMMUNITY)
Admission: EM | Admit: 2017-01-30 | Discharge: 2017-01-31 | Disposition: A | Discharge status: Home / Self Care | Payer: Medicare Other | Attending: Internal Medicine | Admitting: Internal Medicine

## 2017-01-30 ENCOUNTER — Encounter (HOSPITAL_COMMUNITY): Payer: Self-pay | Admitting: Family Medicine

## 2017-01-30 DIAGNOSIS — J449 Chronic obstructive pulmonary disease, unspecified: Secondary | ICD-10-CM | POA: Diagnosis not present

## 2017-01-30 DIAGNOSIS — H538 Other visual disturbances: Secondary | ICD-10-CM | POA: Diagnosis not present

## 2017-01-30 DIAGNOSIS — Z881 Allergy status to other antibiotic agents status: Secondary | ICD-10-CM | POA: Insufficient documentation

## 2017-01-30 DIAGNOSIS — I160 Hypertensive urgency: Secondary | ICD-10-CM | POA: Diagnosis present

## 2017-01-30 DIAGNOSIS — E869 Volume depletion, unspecified: Secondary | ICD-10-CM | POA: Diagnosis not present

## 2017-01-30 DIAGNOSIS — Z7902 Long term (current) use of antithrombotics/antiplatelets: Secondary | ICD-10-CM | POA: Diagnosis not present

## 2017-01-30 DIAGNOSIS — E119 Type 2 diabetes mellitus without complications: Secondary | ICD-10-CM | POA: Diagnosis not present

## 2017-01-30 DIAGNOSIS — Z888 Allergy status to other drugs, medicaments and biological substances status: Secondary | ICD-10-CM | POA: Insufficient documentation

## 2017-01-30 DIAGNOSIS — Z90710 Acquired absence of both cervix and uterus: Secondary | ICD-10-CM | POA: Insufficient documentation

## 2017-01-30 DIAGNOSIS — H9190 Unspecified hearing loss, unspecified ear: Secondary | ICD-10-CM | POA: Insufficient documentation

## 2017-01-30 DIAGNOSIS — Z8673 Personal history of transient ischemic attack (TIA), and cerebral infarction without residual deficits: Secondary | ICD-10-CM | POA: Diagnosis not present

## 2017-01-30 DIAGNOSIS — R51 Headache: Secondary | ICD-10-CM | POA: Diagnosis not present

## 2017-01-30 DIAGNOSIS — Z885 Allergy status to narcotic agent status: Secondary | ICD-10-CM | POA: Insufficient documentation

## 2017-01-30 DIAGNOSIS — Z886 Allergy status to analgesic agent status: Secondary | ICD-10-CM | POA: Insufficient documentation

## 2017-01-30 DIAGNOSIS — Z7982 Long term (current) use of aspirin: Secondary | ICD-10-CM | POA: Insufficient documentation

## 2017-01-30 DIAGNOSIS — Z91048 Other nonmedicinal substance allergy status: Secondary | ICD-10-CM | POA: Insufficient documentation

## 2017-01-30 DIAGNOSIS — R0602 Shortness of breath: Secondary | ICD-10-CM | POA: Diagnosis present

## 2017-01-30 DIAGNOSIS — R42 Dizziness and giddiness: Secondary | ICD-10-CM | POA: Diagnosis not present

## 2017-01-30 DIAGNOSIS — R110 Nausea: Secondary | ICD-10-CM | POA: Insufficient documentation

## 2017-01-30 DIAGNOSIS — Z9049 Acquired absence of other specified parts of digestive tract: Secondary | ICD-10-CM | POA: Insufficient documentation

## 2017-01-30 DIAGNOSIS — K219 Gastro-esophageal reflux disease without esophagitis: Secondary | ICD-10-CM | POA: Insufficient documentation

## 2017-01-30 DIAGNOSIS — E871 Hypo-osmolality and hyponatremia: Secondary | ICD-10-CM | POA: Diagnosis not present

## 2017-01-30 DIAGNOSIS — I10 Essential (primary) hypertension: Secondary | ICD-10-CM

## 2017-01-30 DIAGNOSIS — Z79899 Other long term (current) drug therapy: Secondary | ICD-10-CM | POA: Insufficient documentation

## 2017-01-30 DIAGNOSIS — Z85038 Personal history of other malignant neoplasm of large intestine: Secondary | ICD-10-CM | POA: Insufficient documentation

## 2017-01-30 DIAGNOSIS — J45909 Unspecified asthma, uncomplicated: Secondary | ICD-10-CM | POA: Diagnosis present

## 2017-01-30 HISTORY — DX: Chronic respiratory failure with hypoxia: J96.11

## 2017-01-30 LAB — BASIC METABOLIC PANEL
Anion gap: 9 (ref 5–15)
Anion gap: 8 (ref 5–15)
Anion gap: 8 (ref 5–15)
Anion gap: 5 (ref 5–15)
BUN: 12 mg/dL (ref 6–20)
BUN: 11 mg/dL (ref 6–20)
BUN: 10 mg/dL (ref 6–20)
BUN: 9 mg/dL (ref 6–20)
CALCIUM: 9.4 mg/dL (ref 8.9–10.3)
CHLORIDE: 89 mmol/L — AB (ref 101–111)
CHLORIDE: 92 mmol/L — AB (ref 101–111)
CHLORIDE: 92 mmol/L — AB (ref 101–111)
CHLORIDE: 92 mmol/L — AB (ref 101–111)
CO2: 34 mmol/L — AB (ref 22–32)
CO2: 34 mmol/L — ABNORMAL HIGH (ref 22–32)
CO2: 34 mmol/L — ABNORMAL HIGH (ref 22–32)
CO2: 35 mmol/L — ABNORMAL HIGH (ref 22–32)
CREATININE: 0.77 mg/dL (ref 0.44–1.00)
CREATININE: 0.69 mg/dL (ref 0.44–1.00)
Calcium: 9 mg/dL (ref 8.9–10.3)
Calcium: 8.9 mg/dL (ref 8.9–10.3)
Calcium: 8.7 mg/dL — ABNORMAL LOW (ref 8.9–10.3)
Creatinine, Ser: 0.69 mg/dL (ref 0.44–1.00)
Creatinine, Ser: 0.76 mg/dL (ref 0.44–1.00)
GFR calc Af Amer: >60 mL/min (ref >60)
GFR calc Af Amer: >60 mL/min (ref >60)
GFR calc Af Amer: >60 mL/min (ref >60)
GFR calc non Af Amer: >60 mL/min (ref >60)
GFR calc non Af Amer: >60 mL/min (ref >60)
GFR calc non Af Amer: >60 mL/min (ref >60)
GLUCOSE: 100 mg/dL — AB (ref 65–99)
GLUCOSE: 97 mg/dL (ref 65–99)
GLUCOSE: 104 mg/dL — AB (ref 65–99)
Glucose, Bld: 108 mg/dL — ABNORMAL HIGH (ref 65–99)
POTASSIUM: 3.9 mmol/L (ref 3.5–5.1)
POTASSIUM: 4 mmol/L (ref 3.5–5.1)
POTASSIUM: 4.1 mmol/L (ref 3.5–5.1)
Potassium: 4.3 mmol/L (ref 3.5–5.1)
Sodium: 132 mmol/L — ABNORMAL LOW (ref 135–145)
Sodium: 134 mmol/L — ABNORMAL LOW (ref 135–145)
Sodium: 134 mmol/L — ABNORMAL LOW (ref 135–145)
Sodium: 132 mmol/L — ABNORMAL LOW (ref 135–145)

## 2017-01-30 LAB — URINALYSIS, ROUTINE W REFLEX MICROSCOPIC
BILIRUBIN URINE: NEGATIVE
Glucose, UA: NEGATIVE mg/dL
Ketones, ur: NEGATIVE mg/dL
NITRITE: NEGATIVE
PROTEIN: NEGATIVE mg/dL
Specific Gravity, Urine: 1.005 (ref 1.005–1.030)
pH: 7 (ref 5.0–8.0)

## 2017-01-30 LAB — CBC
HCT: 41.3 % (ref 36.0–46.0)
HCT: 38.5 % (ref 36.0–46.0)
Hemoglobin: 13.4 g/dL (ref 12.0–15.0)
Hemoglobin: 12.6 g/dL (ref 12.0–15.0)
MCH: 30.5 pg (ref 26.0–34.0)
MCH: 30.7 pg (ref 26.0–34.0)
MCHC: 32.4 g/dL (ref 30.0–36.0)
MCHC: 32.7 g/dL (ref 30.0–36.0)
MCV: 93.9 fL (ref 78.0–100.0)
MCV: 93.9 fL (ref 78.0–100.0)
PLATELETS: 214 10*3/uL (ref 150–400)
PLATELETS: 198 10*3/uL (ref 150–400)
RBC: 4.4 MIL/uL (ref 3.87–5.11)
RBC: 4.1 MIL/uL (ref 3.87–5.11)
RDW: 12.7 % (ref 11.5–15.5)
RDW: 12.5 % (ref 11.5–15.5)
WBC: 6.8 10*3/uL (ref 4.0–10.5)
WBC: 6.6 10*3/uL (ref 4.0–10.5)

## 2017-01-30 LAB — I-STAT TROPONIN, ED: Troponin i, poc: 0.03 ng/mL (ref 0.00–0.08)

## 2017-01-30 MED ORDER — LOSARTAN POTASSIUM 50 MG PO TABS
25.0000 mg | ORAL_TABLET | Freq: Every day | ORAL | Status: DC
  Administered 2017-01-31: 25 mg via ORAL
  Filled 2017-01-30: qty 1

## 2017-01-30 MED ORDER — POLYETHYLENE GLYCOL 3350 17 G PO PACK: 17.0000 g | PACK | Freq: Every day | ORAL | Status: DC | PRN

## 2017-01-30 MED ORDER — HYDROCODONE-ACETAMINOPHEN 5-325 MG PO TABS
1.0000 | ORAL_TABLET | Freq: Every day | ORAL | Status: DC
  Administered 2017-01-30: 1 via ORAL
  Filled 2017-01-30: qty 1

## 2017-01-30 MED ORDER — PROPRANOLOL HCL 20 MG PO TABS
20.0000 mg | ORAL_TABLET | Freq: Two times a day (BID) | ORAL | Status: DC
  Administered 2017-01-30 – 2017-01-31 (×2): 20 mg via ORAL
  Filled 2017-01-30 (×3): qty 1

## 2017-01-30 MED ORDER — ACETAMINOPHEN 325 MG PO TABS
650.0000 mg | ORAL_TABLET | Freq: Four times a day (QID) | ORAL | Status: DC | PRN
  Administered 2017-01-31: 650 mg via ORAL
  Filled 2017-01-30: qty 2

## 2017-01-30 MED ORDER — HEPARIN SODIUM (PORCINE) 5000 UNIT/ML IJ SOLN
5000.0000 [IU] | Freq: Three times a day (TID) | INTRAMUSCULAR | Status: DC
  Administered 2017-01-30 – 2017-01-31 (×3): 5000 [IU] via SUBCUTANEOUS
  Filled 2017-01-30 (×3): qty 1

## 2017-01-30 MED ORDER — PANTOPRAZOLE SODIUM 40 MG PO TBEC
40.0000 mg | DELAYED_RELEASE_TABLET | Freq: Every day | ORAL | Status: DC
  Administered 2017-01-31: 40 mg via ORAL
  Filled 2017-01-30: qty 1

## 2017-01-30 MED ORDER — ALBUTEROL SULFATE (2.5 MG/3ML) 0.083% IN NEBU
2.5000 mg | INHALATION_SOLUTION | Freq: Once | RESPIRATORY_TRACT | Status: DC
  Filled 2017-01-30: qty 3

## 2017-01-30 MED ORDER — CLOPIDOGREL BISULFATE 75 MG PO TABS
75.0000 mg | ORAL_TABLET | Freq: Every day | ORAL | Status: DC
  Administered 2017-01-31: 75 mg via ORAL
  Filled 2017-01-30: qty 1

## 2017-01-30 MED ORDER — ALBUTEROL SULFATE (2.5 MG/3ML) 0.083% IN NEBU: 2.5000 mg | INHALATION_SOLUTION | RESPIRATORY_TRACT | Status: DC | PRN

## 2017-01-30 MED ORDER — PRIMIDONE 50 MG PO TABS
25.0000 mg | ORAL_TABLET | Freq: Two times a day (BID) | ORAL | Status: DC
  Administered 2017-01-30 – 2017-01-31 (×2): 25 mg via ORAL
  Filled 2017-01-30 (×3): qty 0.5

## 2017-01-30 MED ORDER — GABAPENTIN 100 MG PO CAPS
100.0000 mg | ORAL_CAPSULE | Freq: Every day | ORAL | Status: DC
  Administered 2017-01-30: 100 mg via ORAL
  Filled 2017-01-30: qty 1

## 2017-01-30 MED ORDER — SODIUM CHLORIDE 0.9 % IV SOLN
INTRAVENOUS | Status: DC
  Administered 2017-01-30 – 2017-01-31 (×2): via INTRAVENOUS

## 2017-01-30 MED ORDER — FLUTICASONE PROPIONATE 50 MCG/ACT NA SUSP
1.0000 | Freq: Two times a day (BID) | NASAL | Status: DC
  Administered 2017-01-31: 1 via NASAL
  Filled 2017-01-30: qty 16

## 2017-01-30 MED ORDER — PSYLLIUM 95 % PO PACK
PACK | Freq: Every day | ORAL | Status: DC
  Administered 2017-01-31: 1 via ORAL
  Filled 2017-01-30: qty 1

## 2017-01-30 MED ORDER — ONDANSETRON HCL 4 MG/2ML IJ SOLN: 4.0000 mg | Freq: Four times a day (QID) | INTRAMUSCULAR | Status: DC | PRN

## 2017-01-30 MED ORDER — ERYTHROMYCIN 5 MG/GM OP OINT: 1.0000 "application " | TOPICAL_OINTMENT | Freq: Every day | OPHTHALMIC | Status: DC

## 2017-01-30 MED ORDER — OLOPATADINE HCL 0.7 % OP SOLN: 1.0000 [drp] | Freq: Every day | OPHTHALMIC | Status: DC

## 2017-01-30 MED ORDER — ONDANSETRON HCL 4 MG PO TABS
4.0000 mg | ORAL_TABLET | Freq: Four times a day (QID) | ORAL | Status: DC | PRN
  Administered 2017-01-30 – 2017-01-31 (×2): 4 mg via ORAL
  Filled 2017-01-30 (×2): qty 1

## 2017-01-30 MED ORDER — ASPIRIN 81 MG PO CHEW
243.0000 mg | CHEWABLE_TABLET | Freq: Once | ORAL | Status: AC
Start: 2017-01-30 — End: 2017-01-30
  Administered 2017-01-30: 243 mg via ORAL
  Filled 2017-01-30: qty 3

## 2017-01-30 MED ORDER — ACETAMINOPHEN 650 MG RE SUPP: 650.0000 mg | Freq: Four times a day (QID) | RECTAL | Status: DC | PRN

## 2017-01-30 MED ORDER — MOMETASONE FURO-FORMOTEROL FUM 200-5 MCG/ACT IN AERO
2.0000 | INHALATION_SPRAY | Freq: Two times a day (BID) | RESPIRATORY_TRACT | Status: DC
  Filled 2017-01-30: qty 8.8

## 2017-01-30 MED ORDER — HYDRALAZINE HCL 20 MG/ML IJ SOLN: 10.0000 mg | Freq: Three times a day (TID) | INTRAMUSCULAR | Status: DC | PRN

## 2017-01-30 NOTE — ED Triage Notes (Signed)
Pt has ongoing HTN since Monday. Pt takes losartan and her MD told her to increase dosage. This has not made any significant difference. Pt beginning to feel SOB, dizzy, nauseous today and was told to come to ED. Neuro intact in triage.

## 2017-01-30 NOTE — Telephone Encounter (Signed)
FYI [name] is recommended for admission to r/o stroke

## 2017-01-30 NOTE — Telephone Encounter (Signed)
If she is symptomatic with that high of blood pressure she should go to the ER

## 2017-01-30 NOTE — Telephone Encounter (Signed)
Spoke with pt's daughter and advised. She will take pt to [facility] ED for evaluation.

## 2017-01-30 NOTE — H&P (Signed)
[facility] History and Physical  KORTNY LIRETTE MWN:[id number] DOB: November 05, 1920 DOA: 01/30/2017  Referring physician:  PCP: [name], NP   Chief Complaint: "I was dizzy."  HPI: [name] is a 83 y.o. female  with past medical history of asthma, COPD, chronic respiratory failure, diabetes, high blood pressure, mini strokes presents emergency room with chief complaint of dizziness and elevated blood pressure. Patient complaining acutely of onset of decreased visual acuity, dizziness and headache. Headache is on the right side of the head were patient has an eye infection. This is being treated with erythromycin ointment and is getting better. After, to the hospital the patient's decreased vision and headache resolved.  Patient was initially urged to come to the hospital at the request of her primary care doctor due to elevated blood pressures over the last few days. As an outpatient the doctor was trying Shanon Brow today switches and doses of the patient's Cozaar to lower blood pressure. Patient has not had any focal neurological deficits.  ED course Negative head CT. EDP exam showed no focal neurological deficits. Sodium was low. Hospitalist consulted for admission.   Review of Systems:  As per HPI otherwise 10 point review of systems negative.    Past Medical History:  Diagnosis Date  . Arthritis   . Asthma   . Cataract   . Colon cancer ([place])   . COPD (chronic obstructive pulmonary disease) ([place])   . Diabetes mellitus without complication ([place])   . Diverticulitis   . DVT (deep vein thrombosis) in pregnancy ([place])   . Essential hypertension   . GERD (gastroesophageal reflux disease)   . Hearing impaired   . Hiatal hernia   . Hyperlipidemia   . Hypertension   . Seasonal allergies   . TIA (transient ischemic attack)    Past Surgical History:  Procedure Laterality Date  . APPENDECTOMY    . CHOLECYSTECTOMY    . COLON RESECTION  1967  . KIDNEY SURGERY  1966   tumor     . TONSILLECTOMY    . TOTAL ABDOMINAL HYSTERECTOMY     Social History:  reports that she has never smoked. She has never used smokeless tobacco. She reports that she does not drink alcohol or use drugs.  Allergies  Allergen Reactions  . Prochlorperazine Other (See Comments)    tremor  . Celecoxib Nausea And Vomiting and Other (See Comments)    Body aches  . Codeine Nausea And Vomiting    Tolerates hydrocodone  . Fish Oil Nausea And Vomiting  . Fosamax [Alendronate Sodium] Nausea And Vomiting  . Morphine And Related Nausea And Vomiting  . Moxifloxacin Other (See Comments)    fatigue  . Oxycodone Nausea And Vomiting    Tolerates hydrocodone  . Pregabalin Swelling  . Propoxyphene Nausea And Vomiting  . Statins Other (See Comments)    Muscle pain  . Aspirin-Dipyridamole Er Other (See Comments)    Headcache  . Buprenorphine Hcl Nausea And Vomiting    No family history on file.   Prior to Admission medications   Medication Sig Start Date End Date Taking? Authorizing Provider  acetaminophen (TYLENOL) 500 MG tablet Take 500 mg by mouth daily.    [provider]  albuterol (PROVENTIL HFA;VENTOLIN HFA) 108 (90 BASE) MCG/ACT inhaler Inhale 1 puff into the lungs every 4 (four) hours as needed for wheezing or shortness of breath.  01/17/14 01/28/17  [provider]  aspirin EC 81 MG tablet Take 81 mg by mouth daily.  Take 1 tablet by mouth every Sunday and Thursday only. 09/01/12   [provider]  Calcium Carbonate-Vit D-Min (CALCIUM 600+D PLUS MINERALS) 600-400 MG-UNIT CHEW Chew 2 tablets by mouth daily.    [provider]  Cholecalciferol (VITAMIN D3) 10000 UNITS capsule Take 10,000 Units by mouth See admin instructions. Take 1 capsule (10,000 units) Monday thru Thursday    [provider]  clopidogrel (PLAVIX) 75 MG tablet Take 1 tablet (75 mg total) by mouth daily. 01/28/17   Nafziger, Tommi Rumps, NP  dexlansoprazole (DEXILANT) 60 MG capsule Take 60 mg  by mouth daily.  10/12/14 01/28/17  [provider]  erythromycin ophthalmic ointment Place 1 application into both eyes at bedtime.    [provider]  ferrous sulfate 325 (65 FE) MG tablet Take 325 mg by mouth daily with breakfast. Take Monday, Wednesday, and Friday 10/12/14   [provider]  fluticasone (FLONASE) 50 MCG/ACT nasal spray Place 1 spray into both nostrils 2 (two) times daily.  11/01/14   [provider]  Fluticasone-Salmeterol (ADVAIR) 250-50 MCG/DOSE AEPB Inhale 1 puff into the lungs 2 (two) times daily.    [provider]  gabapentin (NEURONTIN) 100 MG capsule Take 1 capsule (100 mg total) by mouth at bedtime. 01/28/17   Nafziger, Tommi Rumps, NP  HYDROcodone-acetaminophen (NORCO/VICODIN) 5-325 MG tablet Take 1 tablet by mouth at bedtime. for pain 01/23/17   [name], NP  ipratropium (ATROVENT) 0.03 % nasal spray Place 1 spray into both nostrils daily as needed for rhinitis.    [provider]  lisinopril (PRINIVIL,ZESTRIL) 40 MG tablet Take 40 mg by mouth daily.    [provider]  loratadine (CLARITIN) 10 MG tablet Take 10 mg by mouth daily.  09/01/12   [provider]  losartan (COZAAR) 25 MG tablet Take 25 mg by mouth daily.    [provider]  NASAL SPRAY SALINE NA Place into the nose.    [provider]  Olopatadine HCl (PAZEO) 0.7 % SOLN Apply to eye.    [provider]  ondansetron (ZOFRAN) 4 MG tablet Take 4 mg by mouth every 8 (eight) hours as needed for nausea or vomiting.  07/26/14   [provider]  Polyethyl Glycol-Propyl Glycol (SYSTANE) 0.4-0.3 % SOLN Apply 1 drop to eye 4 (four) times daily.    [provider]  polyethylene glycol powder (GLYCOLAX/MIRALAX) powder Take 17 g by mouth daily as needed (constipation). Mix in 8 oz liquid and drink 12/30/13   [provider]  primidone (MYSOLINE) 50 MG tablet TAKE 1/2 TABLET BY MOUTH TWICE A DAY 01/29/17    Nafziger, Tommi Rumps, NP  Probiotic Product (PROBIOTIC PO) Take 1 capsule by mouth daily.    [provider]  propranolol (INDERAL) 20 MG tablet Take 20 mg by mouth 2 (two) times daily.  10/12/14   [provider]  Psyllium (METAMUCIL FIBER PO) Take by mouth daily.    [provider]   Physical Exam: Vitals:   01/30/17 1036 01/30/17 1100 01/30/17 1145 01/30/17 1159  BP:  (!) 186/83 (!) 185/78   Pulse:  (!) 57 (!) 54   Resp:  12 15   Temp:    98 F (36.7 C)  TempSrc:      SpO2:  95% 92%   Weight: 56.7 kg (125 lb)     Height: 5\' 4"  (1.626 m)       Wt Readings from Last 3 Encounters:  01/30/17 56.7 kg (125 lb)  01/28/17  56.8 kg (125 lb 3.2 oz)  01/23/17 56.7 kg (125 lb)    General:  Appears calm and comfortable; alert and oriented 3, very thin build Eyes:  PERRL, EOMI, normal lids, iris ENT:  grossly normal hearing, lips & tongue Neck:  no LAD, masses or thyromegaly Cardiovascular:  RRR, no m/r/g. No LE edema.  Respiratory:  Left upper wheezing, otherwise no w/r/r. Normal respiratory effort. Abdomen:  soft, ntnd Skin:  no rash or induration seen on limited exam Musculoskeletal:  grossly normal tone BUE/BLE Psychiatric:  grossly normal mood and affect, speech fluent and appropriate Neurologic:  CN 2-12 grossly intact, moves all extremities in coordinated fashion.          Labs on Admission:  Basic Metabolic Panel:  Recent Labs Lab 01/30/17 1038  NA 132*  K 4.3  CL 89*  CO2 34*  GLUCOSE 108*  BUN 12  CREATININE 0.77  CALCIUM 9.4   Liver Function Tests: No results for input(s): AST, ALT, ALKPHOS, BILITOT, PROT, ALBUMIN in the last 168 hours. No results for input(s): LIPASE, AMYLASE in the last 168 hours. No results for input(s): AMMONIA in the last 168 hours. CBC:  Recent Labs Lab 01/30/17 1038  WBC 6.8  HGB 13.4  HCT 41.3  MCV 93.9  PLT 214   Cardiac Enzymes: No results for input(s): CKTOTAL, CKMB, CKMBINDEX, TROPONINI in the last  168 hours.  BNP (last 3 results) No results for input(s): BNP in the last 8760 hours.  ProBNP (last 3 results) No results for input(s): PROBNP in the last 8760 hours.   Serum creatinine: 0.77 mg/dL 01/30/17 1038 Estimated creatinine clearance: 37.9 mL/min  CBG: No results for input(s): GLUCAP in the last 168 hours.  Radiological Exams on Admission: Dg Chest 2 View  Result Date: 01/30/2017 CLINICAL DATA:  83 year old female with chest pain and shortness of Breath today. EXAM: CHEST  2 VIEW COMPARISON:  11/07/2015 and earlier. FINDINGS: Chronic large lung volumes including in increased AP dimension to the chest. No pneumothorax, pulmonary edema, pleural effusion or acute pulmonary opacity. Stable cardiomegaly and mediastinal contours. Chronic gastric hiatal hernia is less apparent today. Calcified aortic atherosclerosis. Osteopenia. No acute osseous abnormality identified. IMPRESSION: 1.  No acute cardiopulmonary abnormality. 2. Chronic pulmonary hyperinflation, Calcified aortic atherosclerosis, cardiomegaly, hiatal hernia. Electronically Signed   By: Genevie Ann M.D.   On: 01/30/2017 11:48   Ct Head Wo Contrast  Result Date: 01/30/2017 CLINICAL DATA:  Uncontrolled hypertension for 1 week. EXAM: CT HEAD WITHOUT CONTRAST TECHNIQUE: Contiguous axial images were obtained from the base of the skull through the vertex without intravenous contrast. COMPARISON:  Head CT scan 02/07/2016. FINDINGS: Brain: Hypoattenuation in the periventricular and subcortical deep white matter consistent chronic microvascular ischemic change is again seen. There is no evidence of acute intracranial abnormality including hemorrhage, infarct, mass lesion, mass effect, midline shift or abnormal extra-axial fluid collection. No hydrocephalus or pneumocephalus. Vascular: Atherosclerosis noted. Skull: Intact. Sinuses/Orbits: Negative. Other: None. IMPRESSION: No acute abnormality. Chronic microvascular ischemic change.  Atherosclerosis. Electronically Signed   By: Inge Rise M.D.   On: 01/30/2017 12:51    EKG: Independently reviewed. No STEMI  Assessment/Plan Principal Problem:   Hyponatremia with extracellular fluid depletion Active Problems:   Asthma   COPD (chronic obstructive pulmonary disease) (HCC)   Diabetes mellitus without complication (HCC)   Hypertensive urgency   Dizziness  Dizziness/Hyponatremia Neuro checks Saline infusion Serial sodium checks MRI of brain ordered if possible consult neurology, permissive HTN for now Likely  due to dehydration which IV fluids also help UA pending Taking visual acuity test for patients now resolved revision, could this be due to patient's application of erythromycin ointment? Decreased oral intake Consult to nutrition  Acute on chronic respiratory failure Chest x-ray normal Recheck in the morning after patient hydrated Albuterol when necessary Did not suspect COPD exacerbation at this time Patient not any respiratory distress on exam just mild wheeze in upper left lobe  Hypertension When necessary hydralazine 10 mg IV as needed for severe blood pressure\ Cont cozaar at dose level prior to last change Cont inderal  Asthma Dulera bid (sub) Prn albuterol  TIA hx Cont plavix  Allergies Opatadine and Flonase cont Hold claritin  Chronic pain Continue gabapentin  Eye infxn Cont abx  Early satiety  Check KUB  Weakness PT eval Check UA  GERD PPI   Code Status: FULL DVT Prophylaxis: lovenox Family Communication: dgtr at bedside Disposition Plan: Pending Improvement  Status: obs tele  Elwin Mocha, MD Family Medicine [facility] [email or web address] Password [email or web address]

## 2017-01-30 NOTE — Telephone Encounter (Signed)
Pt's daughter called to report that pt's BP has risen again, she just took it and it is now 214/99 and pt c/o dizziness and nausea. Pt denies headache/cp. She has increased medication as directed.   Cory - Please advise. Thanks!

## 2017-01-30 NOTE — ED Provider Notes (Signed)
[facility] DEPT Provider Note   CSN: [id number] Arrival date & time: 01/30/17  1023     History   Chief Complaint Chief Complaint  Patient presents with  . Hypertension  . Shortness of Breath    HPI [name] is a 83 y.o. female.  HPI Complains of dizziness meaning vertigo and nausea onset approximately a week ago typical of vertigo she's had for several years. Symptoms worse with rotating her head to the right. She also complains of mild headache behind her right eye and mild blurred vision in her right eye. Other associated symptoms include shortness of breath worse with exertion and improved with rest for approximately the past 3-4 weeks. Her blood pressure has been elevated and losartan has been adjusted from 12.5 mg daily to 25 mg daily on 01/27/2017, increased again to 37.5 mg yesterday as she was noted to have blood pressure of 937 systolic. She is presently asymptomatic except for mild nausea. No other associated symptoms. Past Medical History:  Diagnosis Date  . Arthritis   . Asthma   . Cataract   . Colon cancer ([place])   . COPD (chronic obstructive pulmonary disease) ([place])   . Diabetes mellitus without complication ([place])   . Diverticulitis   . DVT (deep vein thrombosis) in pregnancy ([place])   . Essential hypertension   . GERD (gastroesophageal reflux disease)   . Hearing impaired   . Hiatal hernia   . Hyperlipidemia   . Hypertension   . Seasonal allergies   . TIA (transient ischemic attack)     Patient Active Problem List   Diagnosis Date Noted  . Lumbar spondylosis 01/28/2017  . Primary osteoarthritis of right hip 01/28/2017  . Arthritis   . Asthma   . Cataract   . Colon cancer ([place])   . COPD (chronic obstructive pulmonary disease) ([place])   . Diabetes mellitus without complication ([place])   . Diverticulitis   . DVT (deep vein thrombosis) in pregnancy ([place])   . Essential hypertension   . GERD (gastroesophageal reflux disease)   . Hearing impaired   .  Hyperlipidemia   . Hypertension   . TIA (transient ischemic attack)   . Seasonal allergies     Past Surgical History:  Procedure Laterality Date  . APPENDECTOMY    . CHOLECYSTECTOMY    . COLON RESECTION  1967  . KIDNEY SURGERY  1966   tumor   . TONSILLECTOMY    . TOTAL ABDOMINAL HYSTERECTOMY      OB History    No data available       Home Medications    Prior to Admission medications   Medication Sig Start Date End Date Taking? Authorizing Provider  acetaminophen (TYLENOL) 500 MG tablet Take 500 mg by mouth daily.    [provider]  albuterol (PROVENTIL HFA;VENTOLIN HFA) 108 (90 BASE) MCG/ACT inhaler Inhale 1 puff into the lungs every 4 (four) hours as needed for wheezing or shortness of breath.  01/17/14 01/28/17  [provider]  aspirin EC 81 MG tablet Take 81 mg by mouth daily. Take 1 tablet by mouth every Sunday and Thursday only. 09/01/12   [provider]  Calcium Carbonate-Vit D-Min (CALCIUM 600+D PLUS MINERALS) 600-400 MG-UNIT CHEW Chew 2 tablets by mouth daily.    [provider]  Cholecalciferol (VITAMIN D3) 10000 UNITS capsule Take 10,000 Units by mouth See admin instructions. Take 1 capsule (10,000 units) Monday thru Thursday    [provider]  clopidogrel (PLAVIX) 75  MG tablet Take 1 tablet (75 mg total) by mouth daily. 01/28/17   Nafziger, Tommi Rumps, NP  dexlansoprazole (DEXILANT) 60 MG capsule Take 60 mg by mouth daily.  10/12/14 01/28/17  [provider]  erythromycin ophthalmic ointment Place 1 application into both eyes at bedtime.    [provider]  ferrous sulfate 325 (65 FE) MG tablet Take 325 mg by mouth daily with breakfast. Take Monday, Wednesday, and Friday 10/12/14   [provider]  fluticasone (FLONASE) 50 MCG/ACT nasal spray Place 1 spray into both nostrils 2 (two) times daily.  11/01/14   [provider]  Fluticasone-Salmeterol (ADVAIR) 250-50 MCG/DOSE AEPB Inhale 1 puff into the  lungs 2 (two) times daily.    [provider]  gabapentin (NEURONTIN) 100 MG capsule Take 1 capsule (100 mg total) by mouth at bedtime. 01/28/17   Nafziger, Tommi Rumps, NP  HYDROcodone-acetaminophen (NORCO/VICODIN) 5-325 MG tablet Take 1 tablet by mouth at bedtime. for pain 01/23/17   [name], NP  ipratropium (ATROVENT) 0.03 % nasal spray Place 1 spray into both nostrils daily as needed for rhinitis.    [provider]  lisinopril (PRINIVIL,ZESTRIL) 40 MG tablet Take 40 mg by mouth daily.    [provider]  loratadine (CLARITIN) 10 MG tablet Take 10 mg by mouth daily.  09/01/12   [provider]  losartan (COZAAR) 25 MG tablet Take 25 mg by mouth daily.    [provider]  NASAL SPRAY SALINE NA Place into the nose.    [provider]  Olopatadine HCl (PAZEO) 0.7 % SOLN Apply to eye.    [provider]  ondansetron (ZOFRAN) 4 MG tablet Take 4 mg by mouth every 8 (eight) hours as needed for nausea or vomiting.  07/26/14   [provider]  Polyethyl Glycol-Propyl Glycol (SYSTANE) 0.4-0.3 % SOLN Apply 1 drop to eye 4 (four) times daily.    [provider]  polyethylene glycol powder (GLYCOLAX/MIRALAX) powder Take 17 g by mouth daily as needed (constipation). Mix in 8 oz liquid and drink 12/30/13   [provider]  primidone (MYSOLINE) 50 MG tablet TAKE 1/2 TABLET BY MOUTH TWICE A DAY 01/29/17   Nafziger, Tommi Rumps, NP  Probiotic Product (PROBIOTIC PO) Take 1 capsule by mouth daily.    [provider]  propranolol (INDERAL) 20 MG tablet Take 20 mg by mouth 2 (two) times daily.  10/12/14   [provider]  Psyllium (METAMUCIL FIBER PO) Take by mouth daily.    [provider]    Family History No family history on file.  Social History Social History  Substance Use Topics  . Smoking status: Never Smoker  . Smokeless tobacco: Never Used  . Alcohol use No     Allergies   Prochlorperazine;  Celecoxib; Codeine; Fish oil; Fosamax [alendronate sodium]; Morphine and related; Moxifloxacin; Oxycodone; Pregabalin; Propoxyphene; Statins; Aspirin-dipyridamole er; and Buprenorphine hcl   Review of Systems Review of Systems  Constitutional: Negative.   HENT: Negative.   Eyes: Positive for visual disturbance.  Respiratory: Positive for shortness of breath.   Cardiovascular: Negative.   Gastrointestinal: Positive for nausea.  Musculoskeletal: Positive for gait problem.       Walks with walker  Skin: Negative.   Neurological: Positive for dizziness and headaches.  Psychiatric/Behavioral: Negative.   All other systems reviewed and are negative.    Physical Exam Updated Vital Signs BP (!) 202/78 (BP Location: Left Arm)   Pulse 60   Temp 98 F (  36.7 C) (Oral)   Resp 18   Ht 5\' 4"  (1.626 m)   Wt 56.7 kg (125 lb)   SpO2 96%   BMI 21.46 kg/m   Physical Exam  Constitutional: She is oriented to person, place, and time. She appears well-developed and well-nourished. No distress.  HENT:  Head: Normocephalic and atraumatic.  Bilateral tympanic membranes normal  Eyes: Conjunctivae are normal. Pupils are equal, round, and reactive to light.  No subconjunctival erythema. Visual acuity 20/30 left eye, 2030 right eye  Neck: Neck supple. No tracheal deviation present. No thyromegaly present.  No bruit  Cardiovascular: Normal rate and regular rhythm.   No murmur heard. Pulmonary/Chest: Effort normal and breath sounds normal.  Abdominal: Soft. Bowel sounds are normal. She exhibits no distension. There is no tenderness.  Musculoskeletal: Normal range of motion. She exhibits no edema or tenderness.  Neurological: She is alert and oriented to person, place, and time. Coordination normal.  Finger-nose normal DTR symmetric bilaterally at knee jerk ankle jerk and biceps toes downward going bilaterally. Moves all extremities well patient passed stroke swallow screen.  Skin: Skin is warm and  dry. No rash noted.  Psychiatric: She has a normal mood and affect.  Nursing note and vitals reviewed.    ED Treatments / Results  Labs (all labs ordered are listed, but only abnormal results are displayed) Labs Reviewed  CBC  BASIC METABOLIC PANEL  I-STAT [place], ED    EKG  EKG Interpretation  Date/Time:  Thursday January 30 2017 10:33:34 EDT Ventricular Rate:  60 PR Interval:  152 QRS Duration: 80 QT Interval:  404 QTC Calculation: 404 R Axis:   86 Text Interpretation:  Normal sinus rhythm Septal infarct , age undetermined Abnormal ECG No significant change since last tracing Confirmed by Orlie Dakin [phone]) on 01/30/2017 11:06:59 AM     Chest x-ray viewed by me Results for orders placed or performed during the hospital encounter of 47/82/95  Basic metabolic panel  Result Value Ref Range   Sodium 132 (L) 135 - 145 mmol/L   Potassium 4.3 3.5 - 5.1 mmol/L   Chloride 89 (L) 101 - 111 mmol/L   CO2 34 (H) 22 - 32 mmol/L   Glucose, Bld 108 (H) 65 - 99 mg/dL   BUN 12 6 - 20 mg/dL   Creatinine, Ser 0.77 0.44 - 1.00 mg/dL   Calcium 9.4 8.9 - 10.3 mg/dL   GFR calc non Af Amer >60 >60 mL/min   GFR calc Af Amer >60 >60 mL/min   Anion gap 9 5 - 15  CBC  Result Value Ref Range   WBC 6.8 4.0 - 10.5 K/uL   RBC 4.40 3.87 - 5.11 MIL/uL   Hemoglobin 13.4 12.0 - 15.0 g/dL   HCT 41.3 36.0 - 46.0 %   MCV 93.9 78.0 - 100.0 fL   MCH 30.5 26.0 - 34.0 pg   MCHC 32.4 30.0 - 36.0 g/dL   RDW 12.7 11.5 - 15.5 %   Platelets 214 150 - 400 K/uL  I-stat troponin, ED  Result Value Ref Range   Troponin i, poc 0.03 0.00 - 0.08 ng/mL   Comment 3           Dg Chest 2 View  Result Date: 01/30/2017 CLINICAL DATA:  83 year old female with chest pain and shortness of Breath today. EXAM: CHEST  2 VIEW COMPARISON:  11/07/2015 and earlier. FINDINGS: Chronic large lung volumes including in increased AP dimension to the chest. No pneumothorax, pulmonary edema,  pleural effusion or acute pulmonary  opacity. Stable cardiomegaly and mediastinal contours. Chronic gastric hiatal hernia is less apparent today. Calcified aortic atherosclerosis. Osteopenia. No acute osseous abnormality identified. IMPRESSION: 1.  No acute cardiopulmonary abnormality. 2. Chronic pulmonary hyperinflation, Calcified aortic atherosclerosis, cardiomegaly, hiatal hernia. Electronically Signed   By: Genevie Ann M.D.   On: 01/30/2017 11:48   Dg Lumbar Spine 2-3 Views  Result Date: 01/28/2017 CLINICAL DATA:  Chronic low back pain, initial encounter EXAM: LUMBAR SPINE - 2-3 VIEW COMPARISON:  None. FINDINGS: Five lumbar type vertebral bodies are well visualized. Vertebral body height is well maintained. Mild osteophytic changes are seen. Facet hypertrophic changes are noted. Diffuse aortic calcifications are seen. No acute bony abnormality is noted. Diffuse fecal material is noted throughout colon consistent with a degree of constipation. IMPRESSION: Degenerative change of the lumbar spine. Constipation. Electronically Signed   By: Inez Catalina M.D.   On: 01/28/2017 11:40   Ct Head Wo Contrast  Result Date: 01/30/2017 CLINICAL DATA:  Uncontrolled hypertension for 1 week. EXAM: CT HEAD WITHOUT CONTRAST TECHNIQUE: Contiguous axial images were obtained from the base of the skull through the vertex without intravenous contrast. COMPARISON:  Head CT scan 02/07/2016. FINDINGS: Brain: Hypoattenuation in the periventricular and subcortical deep white matter consistent chronic microvascular ischemic change is again seen. There is no evidence of acute intracranial abnormality including hemorrhage, infarct, mass lesion, mass effect, midline shift or abnormal extra-axial fluid collection. No hydrocephalus or pneumocephalus. Vascular: Atherosclerosis noted. Skull: Intact. Sinuses/Orbits: Negative. Other: None. IMPRESSION: No acute abnormality. Chronic microvascular ischemic change. Atherosclerosis. Electronically Signed   By: Inge Rise M.D.   On:  01/30/2017 12:51   Dg Hip Unilat W Or W/o Pelvis 2-3 Views Right  Result Date: 01/28/2017 CLINICAL DATA:  Chronic low back pain and right hip pain common no known injury, initial encounter EXAM: DG HIP (WITH OR WITHOUT PELVIS) 2-3V RIGHT COMPARISON:  None. FINDINGS: Significant degenerative changes of the right hip joint are noted with remodeling of the femoral head as well as the acetabulum. Subchondral sclerosis and cyst formation is noted. The pelvic ring is intact. Mild degenerative change of the left hip joint is seen. IMPRESSION: Severe osteoarthritic changes of the right hip joint. No acute abnormality noted. Electronically Signed   By: Inez Catalina M.D.   On: 01/28/2017 11:39    Radiology Dg Lumbar Spine 2-3 Views  Result Date: 01/28/2017 CLINICAL DATA:  Chronic low back pain, initial encounter EXAM: LUMBAR SPINE - 2-3 VIEW COMPARISON:  None. FINDINGS: Five lumbar type vertebral bodies are well visualized. Vertebral body height is well maintained. Mild osteophytic changes are seen. Facet hypertrophic changes are noted. Diffuse aortic calcifications are seen. No acute bony abnormality is noted. Diffuse fecal material is noted throughout colon consistent with a degree of constipation. IMPRESSION: Degenerative change of the lumbar spine. Constipation. Electronically Signed   By: Inez Catalina M.D.   On: 01/28/2017 11:40   Dg Hip Unilat W Or W/o Pelvis 2-3 Views Right  Result Date: 01/28/2017 CLINICAL DATA:  Chronic low back pain and right hip pain common no known injury, initial encounter EXAM: DG HIP (WITH OR WITHOUT PELVIS) 2-3V RIGHT COMPARISON:  None. FINDINGS: Significant degenerative changes of the right hip joint are noted with remodeling of the femoral head as well as the acetabulum. Subchondral sclerosis and cyst formation is noted. The pelvic ring is intact. Mild degenerative change of the left hip joint is seen. IMPRESSION: Severe osteoarthritic changes of the right hip  joint. No acute  abnormality noted. Electronically Signed   By: Inez Catalina M.D.   On: 01/28/2017 11:39    Procedures Procedures (including critical care time)  Medications Ordered in ED Medications - No data to display   Initial Impression / Assessment and Plan / ED Course  I have reviewed the triage vital signs and the nursing notes.  Pertinent labs & imaging results that were available during my care of the patient were reviewed by me and considered in my medical decision making (see chart for details).     In light of patient's stroke risk factors, complaining of visual changes and vertigo she may need further evaluation for stroke . In light of nausea and dyspnea. Concern for anginal equivalent. No signs of end organ damage. I've consulted hospitalist service who will arrange for overnight stay and see patient in ED  Final Clinical Impressions(s) / ED Diagnoses  Diagnoses #1 hypertension #2 headache #3 dyspnea #4 nausea Final diagnoses:  None    New Prescriptions New Prescriptions   No medications on file     Orlie Dakin, MD 01/30/17 1336

## 2017-01-30 NOTE — Progress Notes (Addendum)
  Pt arrived on unit from ED. A&O X 4. No c/o of pain rates pain 0/10. Tele box 40-32 confirmed with 2nd RN and tele. Pt has bilat hearing aids, no language barrier.           4 eyes skin assessment done by Carolynne Edouard, RN and Ellard Artis, RN

## 2017-01-31 ENCOUNTER — Other Ambulatory Visit: Payer: Self-pay | Admitting: Adult Health

## 2017-01-31 ENCOUNTER — Observation Stay (HOSPITAL_COMMUNITY): Payer: Medicare Other

## 2017-01-31 DIAGNOSIS — E869 Volume depletion, unspecified: Secondary | ICD-10-CM | POA: Diagnosis not present

## 2017-01-31 DIAGNOSIS — E871 Hypo-osmolality and hyponatremia: Secondary | ICD-10-CM

## 2017-01-31 LAB — CBC
HCT: 37.3 % (ref 36.0–46.0)
Hemoglobin: 12.3 g/dL (ref 12.0–15.0)
MCH: 30.9 pg (ref 26.0–34.0)
MCHC: 33 g/dL (ref 30.0–36.0)
MCV: 93.7 fL (ref 78.0–100.0)
PLATELETS: 182 10*3/uL (ref 150–400)
RBC: 3.98 MIL/uL (ref 3.87–5.11)
RDW: 12.5 % (ref 11.5–15.5)
WBC: 5.8 10*3/uL (ref 4.0–10.5)

## 2017-01-31 LAB — BASIC METABOLIC PANEL
Anion gap: 6 (ref 5–15)
BUN: 9 mg/dL (ref 6–20)
CALCIUM: 8.7 mg/dL — AB (ref 8.9–10.3)
CO2: 32 mmol/L (ref 22–32)
CREATININE: 0.7 mg/dL (ref 0.44–1.00)
Chloride: 98 mmol/L — ABNORMAL LOW (ref 101–111)
Glucose, Bld: 86 mg/dL (ref 65–99)
Potassium: 3.9 mmol/L (ref 3.5–5.1)
SODIUM: 136 mmol/L (ref 135–145)

## 2017-01-31 MED ORDER — ENSURE ENLIVE PO LIQD
237.0000 mL | Freq: Two times a day (BID) | ORAL | Status: DC
  Administered 2017-01-31: 237 mL via ORAL

## 2017-01-31 MED ORDER — POLYETHYLENE GLYCOL 3350 17 G PO PACK
17.0000 g | PACK | Freq: Two times a day (BID) | ORAL | Status: DC
  Administered 2017-01-31: 17 g via ORAL
  Filled 2017-01-31: qty 1

## 2017-01-31 MED ORDER — POLYETHYLENE GLYCOL 3350 17 GM/SCOOP PO POWD: 17.0000 g | Freq: Two times a day (BID) | ORAL | 0 refills | Status: DC

## 2017-01-31 MED ORDER — ENSURE ENLIVE PO LIQD: 237.0000 mL | Freq: Two times a day (BID) | ORAL | 12 refills | Status: DC

## 2017-01-31 MED ORDER — LOSARTAN POTASSIUM 50 MG PO TABS: 50.0000 mg | ORAL_TABLET | Freq: Every day | ORAL | Status: DC

## 2017-01-31 MED ORDER — NEOMYCIN-POLYMYXIN-DEXAMETH 3.5-10000-0.1 OP OINT
1.0000 "application " | TOPICAL_OINTMENT | Freq: Two times a day (BID) | OPHTHALMIC | Status: DC
  Administered 2017-01-31: 1 via OPHTHALMIC
  Filled 2017-01-31: qty 3.5

## 2017-01-31 MED ORDER — HYPROMELLOSE (GONIOSCOPIC) 2.5 % OP SOLN
1.0000 [drp] | Freq: Four times a day (QID) | OPHTHALMIC | Status: DC
  Administered 2017-01-31: 1 [drp] via OPHTHALMIC
  Filled 2017-01-31: qty 15

## 2017-01-31 MED ORDER — LOSARTAN POTASSIUM 50 MG PO TABS: 50.0000 mg | ORAL_TABLET | Freq: Every day | ORAL | 0 refills | Status: DC

## 2017-01-31 MED ORDER — POLYVINYL ALCOHOL 1.4 % OP SOLN
1.0000 [drp] | Freq: Four times a day (QID) | OPHTHALMIC | Status: DC
  Administered 2017-01-31 (×2): 1 [drp] via OPHTHALMIC
  Filled 2017-01-31: qty 15

## 2017-01-31 MED ORDER — LOSARTAN POTASSIUM 50 MG PO TABS
25.0000 mg | ORAL_TABLET | Freq: Once | ORAL | Status: AC
  Administered 2017-01-31: 25 mg via ORAL
  Filled 2017-01-31: qty 1

## 2017-01-31 MED ORDER — KETOTIFEN FUMARATE 0.025 % OP SOLN
1.0000 [drp] | Freq: Every day | OPHTHALMIC | Status: DC
  Administered 2017-01-31: 1 [drp] via OPHTHALMIC
  Filled 2017-01-31: qty 5

## 2017-01-31 MED ORDER — SENNOSIDES-DOCUSATE SODIUM 8.6-50 MG PO TABS
1.0000 | ORAL_TABLET | Freq: Two times a day (BID) | ORAL | Status: DC
  Administered 2017-01-31: 1 via ORAL
  Filled 2017-01-31: qty 1

## 2017-01-31 NOTE — Evaluation (Signed)
Physical Therapy Evaluation Patient Details Name: [name] MRN: [id number] DOB: 1921/02/14 Today's Date: 01/31/2017   History of Present Illness  [name] is a 83 y.o. female  with past medical history of asthma, COPD, chronic respiratory failure, diabetes, high blood pressure, mini strokes presents emergency room with chief complaint of dizziness and elevated blood pressure. Patient reporting acute onset of decreased visual acuity, dizziness and headache.    Clinical Impression  Pt is close to baseline functioning and should be safe at home. There are no further acute PT needs, but pt would benefit greatly from HHPT to build strength/stability around R hip and overall conditioning.  Will sign off at this time.     Follow Up Recommendations Home health PT    Equipment Recommendations  None recommended by PT    Recommendations for Other Services       Precautions / Restrictions Precautions Precautions: Fall      Mobility  Bed Mobility Overal bed mobility: Needs Assistance Bed Mobility: Sit to Supine       Sit to supine: Supervision   General bed mobility comments: Used UE's well to help maneuver in bed.  able to easily lift L LE and struggled with R LE  Transfers Overall transfer level: Needs assistance Equipment used: Rolling walker (2 wheeled) Transfers: Sit to/from Stand Sit to Stand: Supervision         General transfer comment: cues for hand placement and safer technique  Ambulation/Gait Ambulation/Gait assistance: Supervision Ambulation Distance (Feet): 200 Feet Assistive device: Rolling walker (2 wheeled) Gait Pattern/deviations: Step-through pattern Gait velocity: slower Gait velocity interpretation: Below normal speed for age/gender General Gait Details: stable antalgic gait on the R.  Stairs            Wheelchair Mobility    Modified Rankin (Stroke Patients Only)       Balance Overall balance assessment: Needs assistance    Sitting balance-Leahy Scale: Fair     Standing balance support: No upper extremity supported;Bilateral upper extremity supported Standing balance-Leahy Scale: Fair                               Pertinent Vitals/Pain Pain Assessment: Faces Faces Pain Scale: Hurts even more Pain Location: R hip Pain Descriptors / Indicators: Aching;Grimacing;Sharp Pain Intervention(s): Limited activity within patient's tolerance;Monitored during session    Home Living Family/patient expects to be discharged to:: Private residence Living Arrangements: Alone Available Help at Discharge: Family;Available PRN/intermittently Type of Home: Apartment Home Access: Level entry     Home Layout: One level Home Equipment: Walker - 2 wheels;Walker - 4 wheels;Grab bars - toilet;Grab bars - tub/shower      Prior Function Level of Independence: Independent         Comments: drives and is independent in ADL's, dtr/son help with IADL's     Hand Dominance        Extremity/Trunk Assessment   Upper Extremity Assessment Upper Extremity Assessment: Overall WFL for tasks assessed    Lower Extremity Assessment Lower Extremity Assessment: RLE deficits/detail;LLE deficits/detail RLE Deficits / Details: general weakness overall, especially hip flexors with pain at R ( bone on bone) hip.   LLE Deficits / Details: grossly WFL with mild proximal weakness with weak df 3+       Communication   Communication: No difficulties  Cognition Arousal/Alertness: Awake/alert Behavior During Therapy: WFL for tasks assessed/performed Overall Cognitive Status: Within Functional Limits for tasks  assessed                                        General Comments      Exercises     Assessment/Plan    PT Assessment Patient needs continued PT services  PT Problem List Decreased strength;Decreased activity tolerance;Decreased balance;Decreased mobility;Decreased knowledge of use of  DME;Pain       PT Treatment Interventions      PT Goals (Current goals can be found in the Care Plan section)  Acute Rehab PT Goals PT Goal Formulation: All assessment and education complete, DC therapy    Frequency     Barriers to discharge        Co-evaluation               AM-PAC PT "6 Clicks" Daily Activity  Outcome Measure Difficulty turning over in bed (including adjusting bedclothes, sheets and blankets)?: A Little Difficulty moving from lying on back to sitting on the side of the bed? : A Little Difficulty sitting down on and standing up from a chair with arms (e.g., wheelchair, bedside commode, etc,.)?: A Little Help needed moving to and from a bed to chair (including a wheelchair)?: A Little Help needed walking in hospital room?: A Little Help needed climbing 3-5 steps with a railing? : A Little 6 Click Score: 18    End of Session   Activity Tolerance: Patient tolerated treatment well Patient left: in bed;with call bell/phone within reach Nurse Communication: Mobility status PT Visit Diagnosis: Muscle weakness (generalized) (M62.81);Other abnormalities of gait and mobility (R26.89);Pain Pain - Right/Left: Right Pain - part of body: Hip    Time: 8315-1761 PT Time Calculation (min) (ACUTE ONLY): 26 min   Charges:   PT Evaluation $PT Eval Moderate Complexity: 1 Procedure PT Treatments $Gait Training: 8-22 mins   PT G Codes:   PT G-Codes **NOT FOR INPATIENT CLASS** Functional Assessment Tool Used: AM-PAC 6 Clicks Basic Mobility;Clinical judgement Functional Limitation: Mobility: Walking and moving around Mobility: Walking and Moving Around Current Status (Y0737): At least 1 percent but less than 20 percent impaired, limited or restricted Mobility: Walking and Moving Around Goal Status [phone]): At least 1 percent but less than 20 percent impaired, limited or restricted Mobility: Walking and Moving Around Discharge Status [phone]): At least 1 percent but  less than 20 percent impaired, limited or restricted    01/31/2017  Donnella Sham, PT [phone]  (pager)  [name] 01/31/2017, 4:01 PM

## 2017-01-31 NOTE — Care Management Obs Status (Signed)
[place] NOTIFICATION   Patient Details  Name: [name] MRN: [id number] Date of Birth: 1920/11/08   Medicare Observation Status Notification Given:  Yes    Sharin Mons, RN 01/31/2017, 5:56 PM

## 2017-01-31 NOTE — Progress Notes (Signed)
[name] to be D/C'd to home with home health per MD order. CM gone for the day at [facility] facility. Discussed with MD and Dr. Posey Pronto stated to go ahead and d/c patient and he would make sure home health was arranged. This plan was then discussed with the patient and family and all questions fully answered.  VSS, Skin clean, dry and intact without evidence of skin break down, no evidence of skin tears noted. IV catheter discontinued intact. Site without signs and symptoms of complications. Dressing and pressure applied.  An After Visit Summary was printed and given to the patient. Prescriptions sent to patient's pharmacy.  D/c education completed with patient/family including follow up instructions, medication list, d/c activities limitations if indicated, with other d/c instructions as indicated by MD - patient able to verbalize understanding, all questions fully answered.   Patient instructed to return to ED, call 911, or call MD for any changes in condition.   Patient escorted via [place], and D/C home via private auto.  Lynann Beaver 01/31/2017 7:35 PM

## 2017-01-31 NOTE — Progress Notes (Signed)
Initial Nutrition Assessment  DOCUMENTATION CODES:   Not applicable  INTERVENTION:   -Ensure Enlive po BID, each supplement provides 350 kcal and 20 grams of protein  NUTRITION DIAGNOSIS:   Predicted suboptimal nutrient intake related to poor appetite as evidenced by per patient/family report.  GOAL:   Patient will meet greater than or equal to 90% of their needs  MONITOR:   PO intake, Supplement acceptance, Labs, Weight trends, Skin, I & O's  REASON FOR ASSESSMENT:   Consult Assessment of nutrition requirement/status  ASSESSMENT:   [name] is a 83 y.o. female  with past medical history of asthma, COPD, chronic respiratory failure, diabetes, high blood pressure, mini strokes presents emergency room with chief complaint of dizziness and elevated blood pressure.  Pt admitted with hyponatremia with extracellular fluid depletion.   Attempted to evaluate x 3, however, pt either down in procedure or receiving nursing care at time of visit.   Per H&P, pt and family complain of decreased appetite PTA. No meal completion data available to review.   Reviewed wt records from [place]. Noted pt weighed 126# on 07/31/16. Wt has been stable.   Unable to complete Nutrition-Focused physical exam at this time.   Given hx of poor oral intake, pt would benefit from addition of nutritional supplements. RD to order.   Labs reviewed.   Diet Order:  Diet regular Room service appropriate? Yes; Fluid consistency: Thin Diet - low sodium heart healthy  Skin:  Wound (see comment) (open wound on rt lateral leg)  Last BM:  01/30/17  Height:   Ht Readings from Last 1 Encounters:  01/30/17 5\' 4"  (1.626 m)    Weight:   Wt Readings from Last 1 Encounters:  01/30/17 125 lb (56.7 kg)    Ideal Body Weight:  54.5 kg  BMI:  Body mass index is 21.46 kg/m.  Estimated Nutritional Needs:   Kcal:  1200-1400  Protein:  55-70 grams  Fluid:  1.2-1.4 L  EDUCATION NEEDS:   No  education needs identified at this time  Chidi Shirer A. Jimmye Norman, RD, LDN, CDE Pager: [phone] After hours Pager: [phone]

## 2017-02-01 ENCOUNTER — Telehealth: Payer: Self-pay | Admitting: *Deleted

## 2017-02-02 ENCOUNTER — Telehealth: Payer: Self-pay | Admitting: *Deleted

## 2017-02-03 ENCOUNTER — Telehealth: Payer: Self-pay | Admitting: Adult Health

## 2017-02-03 ENCOUNTER — Telehealth: Payer: Self-pay | Admitting: *Deleted

## 2017-02-03 NOTE — Telephone Encounter (Signed)
Sherri would like verbal order for home healthcare for twice a wk for 3 wks

## 2017-02-03 NOTE — Telephone Encounter (Signed)
Attempted TCM call to Lilyan Gilford (daughter on DPR); no answer; message left for return call to office.

## 2017-02-03 NOTE — Discharge Planning (Signed)
Late entry [name] [name], RN, BSN, [place] [phone] MD Spoke with pt at bedside regarding discharge planning for [facility]. Offered pt list of home health agencies to choose from.  Pt chose [facility] to render services. [place] of [facility] notified. Patient made aware that [facility] will be in contact in 24-48 hours.  No DME needs identified at this time.

## 2017-02-03 NOTE — Telephone Encounter (Signed)
D/C 01/31/17  Dx: HTN, stool impaction   Call done with patient's daughter on DPR  Transition Care Management Follow-up Telephone Call  How have you been since you were released from the hospital? "My mom has been doing much better. The home health nurse came out today and her BP was lower  Do you understand why you were in the hospital? Yes, patient was admitted d/t nausea, elevated bp, and stool impaction   Do you understand the discharge instrcutions? Yes, reviewed d/c instructions with patient's daughter, understanding voiced  Items Reviewed:  Medications reviewed: Yes, patients lisinopril was d/c and losartan increased to 50mg ; miralax increased to twice daily   Allergies reviewed: Yes, no changes   Dietary changes reviewed: Patient encouraged to increase water and fiber in diet; patient also started ensure enlive  Referrals reviewed:  N/A Functional Questionnaire:   Activities of Daily Living (ADLs):   She states they are independent in the following: feeding States they require assistance with the following: ambulation, bathing and hygiene, continence, grooming, toileting and dressing   Any transportation issues/concerns?: No, patient is currently staying with her daughter who is willing and able to transport patient to any appointments.    Any patient concerns? No  Confirmed importance and date/time of follow-up visits scheduled: Yes, patient to follow up with [name], NP on 02/05/17 Confirmed with patient if condition begins to worsen call PCP or go to the ER.  Patient was given the Call-a-Nurse line [phone]: Yes, educated

## 2017-02-04 NOTE — Telephone Encounter (Signed)
Ok for verbal orders ?

## 2017-02-04 NOTE — Telephone Encounter (Signed)
Sherri notified that per [facility], this is okay.

## 2017-02-04 NOTE — Discharge Summary (Signed)
[facility] Discharge Summary   Patient: [name] ZOX:[id number]   PCP: [name], NP DOB: 10/20/1920   Date of admission: 01/30/2017   Date of discharge: 01/31/2017     Discharge Diagnoses:  Principal Problem:   Hyponatremia with extracellular fluid depletion Active Problems:   Asthma   COPD (chronic obstructive pulmonary disease) ([place])   Diabetes mellitus without complication ([place])   Hypertensive urgency   Dizziness   Hyponatremia   Admitted From: home Disposition:  Home with home health  Recommendations for Outpatient Follow-up:  1. Please follow up with PCP in 1 week, will need adjustment in bp meds   Follow-up Information    [name], NP. Schedule an appointment as soon as possible for a visit in 1 week(s).   Specialty:  Family Medicine Contact information: [address] [place] [address] [phone]          Diet recommendation: cardiac diet  Activity: The patient is advised to gradually reintroduce usual activities.  Discharge Condition: good  Code Status: full code  History of present illness: As per the H and P dictated on admission, "[name] is a 83 y.o. female  with past medical history of asthma, COPD, chronic respiratory failure, diabetes, high blood pressure, mini strokes presents emergency room with chief complaint of dizziness and elevated blood pressure. Patient complaining acutely of onset of decreased visual acuity, dizziness and headache. Headache is on the right side of the head were patient has an eye infection. This is being treated with erythromycin ointment and is getting better. After, to the hospital the patient's decreased vision and headache resolved.  Patient was initially urged to come to the hospital at the request of her primary care doctor due to elevated blood pressures over the last few days. As an outpatient the doctor was trying Shanon Brow today switches and doses of the patient's Cozaar to lower  blood pressure. Patient has not had any focal neurological deficits."  Hospital Course:  Summary of her active problems in the hospital is as following. Dizziness negative serial Neuro checks Pt was given IV fluids,m orthostatics were negative.  Likely due to dehydration MRI brain negative for CVA, symptoms totally resolved with IV fluids.   Shortnes of breath , mild hypoxia. Resolved.  Chest x-ray normal  Hypertension Pt has been taking losartan and lisinopril both. also taking inderal. Blood pressure elevated, will increase losartan and can still increase it further in 1 week on follow up.  Pt and family wasn't keen on stopping inderal, while with mild bradycardia I believe she will benefit from stopping it as it may be the cause of her dizziness as well.   Asthma Dulera bid (sub) Prn albuterol  TIA hx Cont plavix  Allergies Opatadine and Flonase cont  Chronic pain Continue gabapentin  Eye infxn Continue home eye drops.   Early satiety, constipation.  KUB shows constipation, we gave her enema and increased her stool softner.   All other chronic medical condition were stable during the hospitalization.  Patient was seen by physical therapy, who recommended home health, which was arranged by Education officer, museum and case Freight forwarder. On the day of the discharge the patient's vitals were stable, and no other acute medical condition were reported by patient. the patient was felt safe to be discharge at home with home health.  Procedures and Results:  none   Consultations:  none  DISCHARGE MEDICATION: Discharge Medication List as of 01/31/2017  7:33 PM    START  taking these medications   Details  feeding supplement, ENSURE ENLIVE, (ENSURE ENLIVE) LIQD Take 237 mLs by mouth 2 (two) times daily between meals., Starting Sat 02/01/2017, Normal      CONTINUE these medications which have CHANGED   Details  losartan (COZAAR) 50 MG tablet Take 1 tablet (50 mg total) by  mouth daily., Starting Sat 02/01/2017, Normal    polyethylene glycol powder (GLYCOLAX/MIRALAX) powder Take 17 g by mouth 2 (two) times daily. Mix in 8 oz liquid and drink, Starting Fri 01/31/2017, Normal      CONTINUE these medications which have NOT CHANGED   Details  acetaminophen (TYLENOL) 500 MG tablet Take 500 mg by mouth daily., Until Discontinued, Historical Med    albuterol (PROVENTIL HFA;VENTOLIN HFA) 108 (90 BASE) MCG/ACT inhaler Inhale 1 puff into the lungs every 4 (four) hours as needed for wheezing or shortness of breath. , Starting Mon 01/17/2014, Until Thu 01/30/2017, Historical Med    aspirin EC 81 MG tablet Take 81 mg by mouth daily. Take 1 tablet by mouth every Sunday and Thursday only., Starting Tue 09/01/2012, Historical Med    Calcium Carbonate-Vit D-Min (CALCIUM 600+D PLUS MINERALS) 600-400 MG-UNIT CHEW Chew 2 tablets by mouth every evening. , Historical Med    Cholecalciferol (VITAMIN D3) 10000 UNITS capsule Take 10,000 Units by mouth See admin instructions. Take 1 capsule (10,000 units) Monday thru Thursday, Historical Med    clopidogrel (PLAVIX) 75 MG tablet Take 1 tablet (75 mg total) by mouth daily., Starting Tue 01/28/2017, Normal    Dextran 70-Hypromellose, PF, (ARTIFICIAL TEARS PF) 0.1-0.3 % SOLN Place 1 drop into both eyes 4 (four) times daily., Historical Med    ferrous sulfate 325 (65 FE) MG tablet Take 325 mg by mouth daily with breakfast. Take Monday, Wednesday, and Friday, Starting Wed 10/12/2014, Historical Med    fluticasone (FLONASE) 50 MCG/ACT nasal spray Place 1 spray into both nostrils 2 (two) times daily. , Starting 11/01/2014, Until Discontinued, Historical Med    Fluticasone-Salmeterol (ADVAIR) 250-50 MCG/DOSE AEPB Inhale 1 puff into the lungs 2 (two) times daily., Until Discontinued, Historical Med    gabapentin (NEURONTIN) 100 MG capsule Take 1 capsule (100 mg total) by mouth at bedtime., Starting Tue 01/28/2017, Normal    HYDROcodone-acetaminophen  (NORCO/VICODIN) 5-325 MG tablet Take 1 tablet by mouth at bedtime. for pain, Starting Thu 01/23/2017, Print    ipratropium (ATROVENT) 0.03 % nasal spray Place 1 spray into both nostrils daily as needed for rhinitis., Historical Med    ketotifen (ZADITOR) 0.025 % ophthalmic solution Place 1 drop into both eyes every morning., Historical Med    loratadine (CLARITIN) 10 MG tablet Take 10 mg by mouth daily. , Starting 09/01/2012, Until Discontinued, Historical Med    NASAL SPRAY SALINE NA Place 1 spray into both nostrils daily as needed (congestion). , Historical Med    neomycin-polymyxin b-dexamethasone (MAXITROL) 3.5-10000-0.1 OINT Place 1 application into both eyes 2 (two) times daily., Starting Tue 01/28/2017, Historical Med    ondansetron (ZOFRAN) 4 MG tablet Take 2 mg by mouth 2 (two) times daily. , Starting Tue 07/26/2014, Historical Med    primidone (MYSOLINE) 50 MG tablet TAKE 1/2 TABLET BY MOUTH TWICE A DAY, Normal    Probiotic Product (PROBIOTIC PO) Take 1 capsule by mouth daily after lunch. , Historical Med    propranolol (INDERAL) 20 MG tablet Take 20 mg by mouth 2 (two) times daily. , Starting 10/12/2014, Until Discontinued, Historical Med    Psyllium (METAMUCIL FIBER PO) Take  1 scoop by mouth daily. , Historical Med    DEXILANT 60 MG capsule TAKE 1CAP BY MOUTH EVERY MORNING BEFORE BREAKFAST., Normal      STOP taking these medications     lisinopril (PRINIVIL,ZESTRIL) 40 MG tablet        Allergies  Allergen Reactions  . Prochlorperazine Other (See Comments)    tremor  . Celecoxib Nausea And Vomiting and Other (See Comments)    Body aches  . Codeine Nausea And Vomiting    Tolerates hydrocodone  . Fish Oil Nausea And Vomiting  . Fosamax [Alendronate Sodium] Nausea And Vomiting  . Morphine And Related Nausea And Vomiting  . Moxifloxacin Other (See Comments)    fatigue  . Oxycodone Nausea And Vomiting    Tolerates hydrocodone  . Pregabalin Swelling  . Propoxyphene  Nausea And Vomiting  . Statins Other (See Comments)    Muscle pain  . Aspirin-Dipyridamole Er Other (See Comments)    Headcache  . Buprenorphine Hcl Nausea And Vomiting   Discharge Instructions    Diet - low sodium heart healthy    Complete by:  As directed    Discharge instructions    Complete by:  As directed    It is important that you read following instructions as well as go over your medication list with RN to help you understand your care after this hospitalization.  Discharge Instructions: Please follow-up with PCP in one week  Please request your primary care physician to go over all Hospital Tests and Procedure/Radiological results at the follow up,  Please get all Hospital records sent to your PCP by signing hospital release before you go home.   Do not take more than prescribed Pain, Sleep and Anxiety Medications. You were cared for by a hospitalist during your hospital stay. If you have any questions about your discharge medications or the care you received while you were in the hospital after you are discharged, you can call the unit and ask to speak with the hospitalist on call if the hospitalist that took care of you is not available.  Once you are discharged, your primary care physician will handle any further medical issues. Please note that NO REFILLS for any discharge medications will be authorized once you are discharged, as it is imperative that you return to your primary care physician (or establish a relationship with a primary care physician if you do not have one) for your aftercare needs so that they can reassess your need for medications and monitor your lab values. You Must read complete instructions/literature along with all the possible adverse reactions/side effects for all the Medicines you take and that have been prescribed to you. Take any new Medicines after you have completely understood and accept all the possible adverse reactions/side effects. Wear  Seat belts while driving.   Face-to-face encounter (required for Medicare/Medicaid patients)    Complete by:  As directed    I Shavontae Gibeault certify that this patient is under my care and that I, or a nurse practitioner or physician's assistant working with me, had a face-to-face encounter that meets the physician face-to-face encounter requirements with this patient on 01/31/2017. The encounter with the patient was in whole, or in part for the following medical condition(s) which is the primary reason for home health care (List medical condition): hypertension, dizziness, constipation   The encounter with the patient was in whole, or in part, for the following medical condition, which is the primary reason for home health care:  yes   I certify that, based on my findings, the following services are medically necessary home health services:   Nursing Physical therapy     Reason for Medically Necessary Home Health Services:   Skilled Nursing- Changes in Medication/Medication Management Therapy- Gait Training, Transfer Training and Stair Training     My clinical findings support the need for the above services:  Unsafe ambulation due to balance issues   Further, I certify that my clinical findings support that this patient is homebound due to:  Unsafe ambulation due to balance issues   Home Health    Complete by:  As directed    To provide the following care/treatments:   PT RN     Increase activity slowly    Complete by:  As directed      Discharge Exam: Filed Weights   01/30/17 1036  Weight: 56.7 kg (125 lb)   Vitals:   01/31/17 1400 01/31/17 1508  BP: (!) 175/59 (!) 180/61  Pulse: (!) 58 61  Resp: 20   Temp: 98 F (36.7 C)    General: Appear in no distress, no Rash; Oral Mucosa moist. Cardiovascular: S1 and S2 Present, aortic systolic Murmur, no JVD Respiratory: Bilateral Air entry present and Clear to Auscultation, no Crackles, no wheezes Abdomen: Bowel Sound present, Soft and  no tenderness Extremities: no Pedal edema, no calf tenderness Neurology: Grossly no focal neuro deficit.  The results of significant diagnostics from this hospitalization (including imaging, microbiology, ancillary and laboratory) are listed below for reference.    Significant Diagnostic Studies: Dg Chest 2 View  Result Date: 01/31/2017 CLINICAL DATA:  Shortness of Breath EXAM: CHEST  2 VIEW COMPARISON:  01/30/2017 FINDINGS: Dx There is hyperinflation of the lungs compatible with COPD. Cardiomegaly. Moderate to large hiatal hernia. No confluent airspace opacities, effusions or edema. No acute bony abnormality. IMPRESSION: COPD, cardiomegaly. Moderate to large hiatal hernia. No active disease. Electronically Signed   By: Rolm Baptise M.D.   On: 01/31/2017 11:30   Dg Chest 2 View  Result Date: 01/30/2017 CLINICAL DATA:  83 year old female with chest pain and shortness of Breath today. EXAM: CHEST  2 VIEW COMPARISON:  11/07/2015 and earlier. FINDINGS: Chronic large lung volumes including in increased AP dimension to the chest. No pneumothorax, pulmonary edema, pleural effusion or acute pulmonary opacity. Stable cardiomegaly and mediastinal contours. Chronic gastric hiatal hernia is less apparent today. Calcified aortic atherosclerosis. Osteopenia. No acute osseous abnormality identified. IMPRESSION: 1.  No acute cardiopulmonary abnormality. 2. Chronic pulmonary hyperinflation, Calcified aortic atherosclerosis, cardiomegaly, hiatal hernia. Electronically Signed   By: Genevie Ann M.D.   On: 01/30/2017 11:48   Dg Lumbar Spine 2-3 Views  Result Date: 01/28/2017 CLINICAL DATA:  Chronic low back pain, initial encounter EXAM: LUMBAR SPINE - 2-3 VIEW COMPARISON:  None. FINDINGS: Five lumbar type vertebral bodies are well visualized. Vertebral body height is well maintained. Mild osteophytic changes are seen. Facet hypertrophic changes are noted. Diffuse aortic calcifications are seen. No acute bony abnormality is  noted. Diffuse fecal material is noted throughout colon consistent with a degree of constipation. IMPRESSION: Degenerative change of the lumbar spine. Constipation. Electronically Signed   By: Inez Catalina M.D.   On: 01/28/2017 11:40   Dg Abd 1 View  Result Date: 01/30/2017 CLINICAL DATA:  Known hiatal hernia.  Nausea. EXAM: ABDOMEN - 1 VIEW COMPARISON:  January 28, 2017, CT abdomen February 07 2016 FINDINGS: The bowel gas pattern is normal. Extensive bowel content is identified throughout the  colon. Degenerative joint changes of the spine are noted. Prior cholecystectomy clips are noted. No radio-opaque calculi or other significant radiographic abnormality are seen. Chronic change of the right hip is noted. IMPRESSION: Marked constipation. Known hiatal hernia seen on prior CT not well appreciated on this x-ray Electronically Signed   By: Abelardo Diesel M.D.   On: 01/30/2017 15:00   Ct Head Wo Contrast  Result Date: 01/30/2017 CLINICAL DATA:  Uncontrolled hypertension for 1 week. EXAM: CT HEAD WITHOUT CONTRAST TECHNIQUE: Contiguous axial images were obtained from the base of the skull through the vertex without intravenous contrast. COMPARISON:  Head CT scan 02/07/2016. FINDINGS: Brain: Hypoattenuation in the periventricular and subcortical deep white matter consistent chronic microvascular ischemic change is again seen. There is no evidence of acute intracranial abnormality including hemorrhage, infarct, mass lesion, mass effect, midline shift or abnormal extra-axial fluid collection. No hydrocephalus or pneumocephalus. Vascular: Atherosclerosis noted. Skull: Intact. Sinuses/Orbits: Negative. Other: None. IMPRESSION: No acute abnormality. Chronic microvascular ischemic change. Atherosclerosis. Electronically Signed   By: Inge Rise M.D.   On: 01/30/2017 12:51   Mr Brain Wo Contrast  Result Date: 01/30/2017 CLINICAL DATA:  Dizziness and hypertension, decreased visual acuity. RIGHT-sided headache associated  with eye infection, now improving. History of hypertension, diabetes. EXAM: MRI HEAD WITHOUT CONTRAST TECHNIQUE: Multiplanar, multiecho pulse sequences of the brain and surrounding structures were obtained without intravenous contrast. COMPARISON:  CT HEAD September 01, 2016 at 12:20 hours FINDINGS: BRAIN: No reduced diffusion to suggest acute ischemia. Chronic microhemorrhage LEFT frontal lobe. Ventricles and sulci are normal for patient's age. Patchy to confluent supratentorial white matter in to lesser extent pontine T2 hyperintensities. No midline shift, mass effect or masses. No abnormal extra-axial fluid collections. VASCULAR: Normal major intracranial vascular flow voids present at skull base. SKULL AND UPPER CERVICAL SPINE: No abnormal sellar expansion. No suspicious calvarial bone marrow signal. Craniocervical junction maintained. SINUSES/ORBITS: The mastoid air-cells and included paranasal sinuses are well-aerated. The included ocular globes and orbital contents are non-suspicious. Status post bilateral ocular lens implants. OTHER: None. IMPRESSION: No acute intracranial process. Moderate to severe chronic small vessel ischemic disease. Chronic LEFT frontal lobe microhemorrhage, possible cavernoma. Electronically Signed   By: Elon Alas M.D.   On: 01/30/2017 22:33   Dg Hip Unilat W Or W/o Pelvis 2-3 Views Right  Result Date: 01/28/2017 CLINICAL DATA:  Chronic low back pain and right hip pain common no known injury, initial encounter EXAM: DG HIP (WITH OR WITHOUT PELVIS) 2-3V RIGHT COMPARISON:  None. FINDINGS: Significant degenerative changes of the right hip joint are noted with remodeling of the femoral head as well as the acetabulum. Subchondral sclerosis and cyst formation is noted. The pelvic ring is intact. Mild degenerative change of the left hip joint is seen. IMPRESSION: Severe osteoarthritic changes of the right hip joint. No acute abnormality noted. Electronically Signed   By: Inez Catalina M.D.   On: 01/28/2017 11:39    Microbiology: No results found for this or any previous visit (from the past 240 hour(s)).   Labs: CBC:  Recent Labs Lab 01/30/17 1038 01/30/17 1546 01/31/17 0323  WBC 6.8 6.6 5.8  HGB 13.4 12.6 12.3  HCT 41.3 38.5 37.3  MCV 93.9 93.9 93.7  PLT 214 198 852   Basic Metabolic Panel:  Recent Labs Lab 01/30/17 1038 01/30/17 1546 01/30/17 1823 01/30/17 2259 01/31/17 0323  NA 132* 134* 134* 132* 136  K 4.3 3.9 4.0 4.1 3.9  CL 89* 92* 92* 92* 98*  CO2 34* 34* 34* 35* 32  GLUCOSE 108* 100* 97 104* 86  BUN 12 11 10 9 9   CREATININE 0.77 0.69 0.69 0.76 0.70  CALCIUM 9.4 9.0 8.9 8.7* 8.7*   Time spent: 35 minutes  Signed:  Aayliah Rotenberry  [facility] 01/31/2017 , 11:00 PM

## 2017-02-05 ENCOUNTER — Ambulatory Visit (INDEPENDENT_AMBULATORY_CARE_PROVIDER_SITE_OTHER): Payer: Medicare Other | Admitting: Adult Health

## 2017-02-05 ENCOUNTER — Encounter: Payer: Self-pay | Admitting: Adult Health

## 2017-02-05 VITALS — BP 152/80 | Temp 97.8°F | Ht 64.0 in | Wt 121.9 lb

## 2017-02-05 DIAGNOSIS — I10 Essential (primary) hypertension: Secondary | ICD-10-CM

## 2017-02-05 NOTE — Patient Instructions (Signed)
It was great seeing you and I am glad you are doing better since getting out of the hospital   I will see you in four months   Please keep an eye on our blood pressures for me and let me know if they go up past 165/90 consistently

## 2017-02-05 NOTE — Progress Notes (Signed)
Subjective:    Patient ID: [name], female    DOB: 1921/04/29, 83 y.o.   MRN: [id number]  HPI  83 year old female who  has a past medical history of Arthritis; Asthma; Cataract; Chronic respiratory failure with hypoxia ([place]); Colon cancer ([place]); COPD (chronic obstructive pulmonary disease) ([place]); Diabetes mellitus without complication ([place]); Diverticulitis; DVT (deep vein thrombosis) in pregnancy ([place]); Essential hypertension; GERD (gastroesophageal reflux disease); Hearing impaired; Hiatal hernia; Hyperlipidemia; Hypertension; Seasonal allergies; and TIA (transient ischemic attack).   She is being seen in the hospital today for follow up after recent hospital stay. She was admitted on 01/30/2017 and discharged on 01/31/2017    Per ER note:   [name] a 83 y.o.femalewith past medical history of asthma, COPD, chronic respiratory failure, diabetes, high blood pressure, mini strokes presents emergency room with chief complaint of dizziness and elevated blood pressure.Patient complaining acutely of onset of decreased visual acuity, dizziness and headache. Headache is on the right side of the head were patient has an eye infection. This is being treated with erythromycin ointment and is getting better. After, to the hospital the patient's decreased vision and headache resolved.  Patient was initially urged to come to the hospital at the request of her primary care doctor due to elevated blood pressures over the last few days. As an outpatient the doctor was trying Shanon Brow today switches and doses of the patient's Cozaar to lower blood pressure. Patient has not had any focal neurological deficits."  Hospital Course Included:  Dizziness negative serial Neuro checks Pt was given IV fluids,m orthostatics were negative.  Likely due to dehydration MRI brain negative for CVA, symptoms totally resolved with IV fluids.   Shortnes of breath , mild hypoxia. Resolved.  Chest x-ray  normal  Hypertension Pt has been taking losartan and lisinopril both. also taking inderal. Blood pressure elevated, will increase losartan and can still increase it further in 1 week on follow up.  Pt and family wasn't keen on stopping inderal, while with mild bradycardia I believe she will benefit from stopping it as it may be the cause of her dizziness as well.   Asthma Dulera bid (sub) Prn albuterol  TIA hx Cont plavix  Lisinopril 40 mg was d/c. Since being home she reports that she is feeling " back to normal" Her daughter is monitoring here blood pressure and reports readings in the 315-400 systolic. [name] does not endorse any headaches, blurred vision, or dizziness.    Review of Systems See HPI   Past Medical History:  Diagnosis Date  . Arthritis   . Asthma   . Cataract   . Chronic respiratory failure with hypoxia ([place])   . Colon cancer ([place])   . COPD (chronic obstructive pulmonary disease) ([place])   . Diabetes mellitus without complication ([place])   . Diverticulitis   . DVT (deep vein thrombosis) in pregnancy ([place])   . Essential hypertension   . GERD (gastroesophageal reflux disease)   . Hearing impaired   . Hiatal hernia   . Hyperlipidemia   . Hypertension   . Seasonal allergies   . TIA (transient ischemic attack)     Social History   Social History  . Marital status: Widowed    Spouse name: N/A  . Number of children: N/A  . Years of education: N/A   Occupational History  . Not on file.   Social History Main Topics  . Smoking status: Never Smoker  . Smokeless tobacco: Never Used  .  Alcohol use No  . Drug use: No  . Sexual activity: Not on file   Other Topics Concern  . Not on file   Social History Narrative  . No narrative on file    Past Surgical History:  Procedure Laterality Date  . APPENDECTOMY    . CHOLECYSTECTOMY    . COLON RESECTION  1967  . KIDNEY SURGERY  1966   tumor   . TONSILLECTOMY    . TOTAL ABDOMINAL HYSTERECTOMY       No family history on file.  Allergies  Allergen Reactions  . Prochlorperazine Other (See Comments)    tremor  . Celecoxib Nausea And Vomiting and Other (See Comments)    Body aches  . Codeine Nausea And Vomiting    Tolerates hydrocodone  . Fish Oil Nausea And Vomiting  . Fosamax [Alendronate Sodium] Nausea And Vomiting  . Morphine And Related Nausea And Vomiting  . Moxifloxacin Other (See Comments)    fatigue  . Oxycodone Nausea And Vomiting    Tolerates hydrocodone  . Pregabalin Swelling  . Propoxyphene Nausea And Vomiting  . Statins Other (See Comments)    Muscle pain  . Aspirin-Dipyridamole Er Other (See Comments)    Headcache  . Buprenorphine Hcl Nausea And Vomiting    Current Outpatient Prescriptions on File Prior to Visit  Medication Sig Dispense Refill  . acetaminophen (TYLENOL) 500 MG tablet Take 500 mg by mouth daily.    Marland Kitchen aspirin EC 81 MG tablet Take 81 mg by mouth daily. Take 1 tablet by mouth every Sunday and Thursday only.    . Calcium Carbonate-Vit D-Min (CALCIUM 600+D PLUS MINERALS) 600-400 MG-UNIT CHEW Chew 2 tablets by mouth every evening.     . Cholecalciferol (VITAMIN D3) 10000 UNITS capsule Take 10,000 Units by mouth See admin instructions. Take 1 capsule (10,000 units) Monday thru Thursday    . clopidogrel (PLAVIX) 75 MG tablet Take 1 tablet (75 mg total) by mouth daily. 90 tablet 3  . DEXILANT 60 MG capsule TAKE 1CAP BY MOUTH EVERY MORNING BEFORE BREAKFAST. 90 capsule 0  . Dextran 70-Hypromellose, PF, (ARTIFICIAL TEARS PF) 0.1-0.3 % SOLN Place 1 drop into both eyes 4 (four) times daily.    . feeding supplement, ENSURE ENLIVE, (ENSURE ENLIVE) LIQD Take 237 mLs by mouth 2 (two) times daily between meals. 237 mL 12  . ferrous sulfate 325 (65 FE) MG tablet Take 325 mg by mouth daily with breakfast. Take Monday, Wednesday, and Friday  3  . fluticasone (FLONASE) 50 MCG/ACT nasal spray Place 1 spray into both nostrils 2 (two) times daily.   6  .  Fluticasone-Salmeterol (ADVAIR) 250-50 MCG/DOSE AEPB Inhale 1 puff into the lungs 2 (two) times daily.    Marland Kitchen HYDROcodone-acetaminophen (NORCO/VICODIN) 5-325 MG tablet Take 1 tablet by mouth at bedtime. for pain 30 tablet 0  . ipratropium (ATROVENT) 0.03 % nasal spray Place 1 spray into both nostrils daily as needed for rhinitis.    Marland Kitchen ketotifen (ZADITOR) 0.025 % ophthalmic solution Place 1 drop into both eyes daily as needed.     . loratadine (CLARITIN) 10 MG tablet Take 10 mg by mouth daily.     Marland Kitchen losartan (COZAAR) 50 MG tablet Take 1 tablet (50 mg total) by mouth daily. 30 tablet 0  . NASAL SPRAY SALINE NA Place 1 spray into both nostrils daily as needed (congestion).     . neomycin-polymyxin b-dexamethasone (MAXITROL) 3.5-10000-0.1 OINT Place 1 application into both eyes daily.   0  .  ondansetron (ZOFRAN) 4 MG tablet Take 2 mg by mouth 2 (two) times daily.     . polyethylene glycol powder (GLYCOLAX/MIRALAX) powder Take 17 g by mouth 2 (two) times daily. Mix in 8 oz liquid and drink 255 g 0  . primidone (MYSOLINE) 50 MG tablet TAKE 1/2 TABLET BY MOUTH TWICE A DAY 90 tablet 1  . Probiotic Product (PROBIOTIC PO) Take 1 capsule by mouth daily after lunch.     . propranolol (INDERAL) 20 MG tablet Take 20 mg by mouth 2 (two) times daily.   1  . Psyllium (METAMUCIL FIBER PO) Take 1 scoop by mouth daily.     Marland Kitchen albuterol (PROVENTIL HFA;VENTOLIN HFA) 108 (90 BASE) MCG/ACT inhaler Inhale 1 puff into the lungs every 4 (four) hours as needed for wheezing or shortness of breath.      No current facility-administered medications on file prior to visit.     BP (!) 152/80 (BP Location: Left Arm, Patient Position: Sitting, Cuff Size: Small)   Temp 97.8 F (36.6 C) (Oral)   Ht 5\' 4"  (1.626 m)   Wt 121 lb 14.4 oz (55.3 kg)   BMI 20.92 kg/m       Objective:   Physical Exam  Constitutional: She is oriented to person, place, and time. She appears well-developed and well-nourished. No distress.   Cardiovascular: Normal rate, regular rhythm, normal heart sounds and intact distal pulses.  Exam reveals no gallop and no friction rub.   No murmur heard. Pulmonary/Chest: Effort normal and breath sounds normal. No respiratory distress. She has no wheezes. She has no rales. She exhibits no tenderness.  Musculoskeletal:  Walks with a rolling walker  Neurological: She is alert and oriented to person, place, and time.  Skin: Skin is warm and dry. No rash noted. She is not diaphoretic. No erythema. No pallor.  Psychiatric: She has a normal mood and affect. Her behavior is normal. Judgment and thought content normal.  Nursing note and vitals reviewed.     Assessment & Plan:  1. Essential hypertension - Better controlled today. Keep with current therapy. Please continue to monitor BP at home. Inform me if BP is consistently above 165/90's.  - Follow up as needed   [name], NP

## 2017-02-15 ENCOUNTER — Telehealth: Payer: Self-pay | Admitting: Adult Health

## 2017-02-19 ENCOUNTER — Telehealth: Payer: Self-pay | Admitting: Adult Health

## 2017-02-19 MED ORDER — LOSARTAN POTASSIUM 50 MG PO TABS: 50.0000 mg | ORAL_TABLET | Freq: Every day | ORAL | 11 refills | Status: DC

## 2017-02-19 NOTE — Telephone Encounter (Signed)
Ok to refill for one year  

## 2017-02-19 NOTE — Telephone Encounter (Signed)
Rx done. 

## 2017-02-19 NOTE — Telephone Encounter (Signed)
° ° °  Pt request refill of the following:  losartan (COZAAR) 50 MG tablet   Phamacy: [facility]

## 2017-02-20 ENCOUNTER — Ambulatory Visit: Payer: Medicare Other | Admitting: Adult Health

## 2017-02-21 ENCOUNTER — Encounter: Payer: Self-pay | Admitting: Internal Medicine

## 2017-02-21 ENCOUNTER — Ambulatory Visit: Payer: Medicare Other | Admitting: Adult Health

## 2017-02-21 ENCOUNTER — Ambulatory Visit (INDEPENDENT_AMBULATORY_CARE_PROVIDER_SITE_OTHER): Payer: Medicare Other | Admitting: Internal Medicine

## 2017-02-21 VITALS — BP 162/70 | HR 79 | Ht 64.5 in | Wt 123.0 lb

## 2017-02-21 DIAGNOSIS — J4540 Moderate persistent asthma, uncomplicated: Secondary | ICD-10-CM | POA: Diagnosis not present

## 2017-02-21 DIAGNOSIS — J9611 Chronic respiratory failure with hypoxia: Secondary | ICD-10-CM

## 2017-02-21 DIAGNOSIS — J9612 Chronic respiratory failure with hypercapnia: Secondary | ICD-10-CM

## 2017-02-21 DIAGNOSIS — R0609 Other forms of dyspnea: Secondary | ICD-10-CM | POA: Diagnosis not present

## 2017-02-21 DIAGNOSIS — I10 Essential (primary) hypertension: Secondary | ICD-10-CM

## 2017-02-21 MED ORDER — FLUTICASONE FUROATE-VILANTEROL 100-25 MCG/INH IN AEPB: 1.0000 | INHALATION_SPRAY | Freq: Every day | RESPIRATORY_TRACT | 11 refills | Status: AC

## 2017-02-21 MED ORDER — FLUTICASONE FUROATE-VILANTEROL 100-25 MCG/INH IN AEPB: 1.0000 | INHALATION_SPRAY | Freq: Every day | RESPIRATORY_TRACT | 0 refills | Status: DC

## 2017-02-21 MED ORDER — BISOPROLOL FUMARATE 5 MG PO TABS: 5.0000 mg | ORAL_TABLET | Freq: Every day | ORAL | 11 refills | Status: DC

## 2017-02-21 NOTE — Assessment & Plan Note (Signed)
DDX of  difficult airways management almost all start with A and  include Adherence, Ace Inhibitors, Acid Reflux, Active Sinus Disease, Alpha 1 Antitripsin deficiency, Anxiety masquerading as Airways dz,  ABPA,  Allergy(esp in young), Aspiration (esp in elderly), Adverse effects of meds,  Active smokers, A bunch of PE's (a small clot burden can't cause this syndrome unless there is already severe underlying pulm or vascular dz with poor reserve) plus two Bs  = Bronchiectasis and Beta blocker use..and one C= CHF   Adherence is always the initial "prime suspect" and is a multilayered concern that requires a "trust but verify" approach in every patient - starting with knowing how to use medications, especially inhalers, correctly, keeping up with refills and understanding the fundamental difference between maintenance and prns vs those medications only taken for a very short course and then stopped and not refilled.  - see dpi teaching  - recs reviewed directly with pt and care taker  ? Acid (or non-acid) GERD > always difficult to exclude as up to 75% of pts in some series report no assoc GI/ Heartburn symptoms and she has large HH > rec continue max (24h)  acid suppression and diet restrictions  reviewed     ? Allergy/asthma >  Breo 100 each am instead of advair on trial basis   ? Beta blocker effect > only way to know is change propranolol to bisoprolol (see separate a/p)   ? chf > needs bnp if flares    Total time devoted to counseling  > 50 % of initial 60 min office visit:  review case with pt/hired caretaker discussion of options/alternatives/ personally creating written customized instructions  in presence of pt  then going over those specific  Instructions directly with the pt including how to use all of the meds but in particular covering each new medication in detail and the difference between the maintenance= "automatic" meds and the prns using an action plan format for the latter (If this  problem/symptom => do that organization reading Left to right).  Please see AVS from this visit for a full list of these instructions which I personally wrote for this pt and  are unique to this visit.

## 2017-02-21 NOTE — Progress Notes (Signed)
Subjective:     Patient ID: [name], female   DOB: [birth year],    MRN: [id number]  HPI  68 yowf never regular smoker dx with dx of copd maint on advair "since it came out" (2001) referred to pulmonary clinic 02/21/2017 by [name] with ? Copd    02/21/2017 1st [facility] office visit/ Gwendolin Briel  On advair 250/50 bid and inderal/ 02 2lpm hs   Chief Complaint  Patient presents with  . Pulmonary Consult    Referred by [name] for eval of COPD. Pt states she was dxed with COPD approx 20 yrs ago. She states that she gets SOB walking a long distance, esp in humid weather. She has occ cough, but she does not find this bothersome.   chronic mild doe x 20 year but now new distance since April 2018 in  hall from her room to therapy room a little  > 100 ft but more limited by hips/ legs  When misses advair can't tell much difference with doe and not really consistent with it but also on inderol/ never uses saba cause it makes her too shaky   No obvious day to day or daytime variability or assoc excess/ purulent sputum or mucus plugs or hemoptysis or cp or chest tightness, subjective wheeze or overt sinus or hb symptoms. No unusual exp hx or h/o childhood pna/ asthma or knowledge of premature birth.  Sleeping ok without nocturnal  or early am exacerbation  of respiratory  c/o's or need for noct saba. Also denies any obvious fluctuation of symptoms with weather or environmental changes or other aggravating or alleviating factors except as outlined above   Current Medications, Allergies, Complete Past Medical History, Past Surgical History, Family History, and Social History were reviewed in [employer] record.  ROS  The following are not active complaints unless bolded sore throat, dysphagia, dental problems, itching, sneezing,  nasal congestion or excess/ purulent secretions, ear ache,   fever, chills, sweats, unintended wt loss, classically pleuritic or exertional  cp,  orthopnea pnd or leg swelling, presyncope, palpitations, abdominal pain, anorexia, nausea, vomiting, diarrhea  or change in bowel or bladder habits, change in stools or urine, dysuria,hematuria,  rash, arthralgias, visual complaints, headache, numbness, weakness or ataxia or problems with walking or coordination,  change in mood/affect or memory.           Review of Systems     Objective:   Physical Exam    pleasant mildly hoarse amb wf nad needs assistance to get on table, very slow ambulation with walker   Wt Readings from Last 3 Encounters:  02/21/17 123 lb (55.8 kg)  02/05/17 121 lb 14.4 oz (55.3 kg)  01/30/17 125 lb (56.7 kg)    Vital signs reviewed - Note on arrival 02 sats  93% on RA     HEENT: nl dentition, turbinates bilaterally, and oropharynx. Nl external ear canals without cough reflex   NECK :  without JVD/Nodes/TM/ nl carotid upstrokes bilaterally   LUNGS: no acc muscle use,  Nl contour chest with a few insp and exp rhonchi bilaterally    CV:  RRR  no s3 or murmur or increase in P2, and no edema   ABD:  soft and nontender with nl inspiratory excursion in the supine position. No bruits or organomegaly appreciated, bowel sounds nl  MS:  Slow unsteady gait/ ext warm without deformities, calf tenderness, cyanosis or clubbing     SKIN: warm and dry without  lesions    NEURO:  alert, approp, nl sensorium with  no motor or cerebellar deficits apparent - min tremor     I personally reviewed images and agree with radiology impression as follows:  CXR:   01/31/17 COPD, cardiomegaly. Moderate to large hiatal hernia. No active disease.  Also MRI 01/30/17  The mastoid air-cells and included paranasal sinuses are well-aerated. The included ocular globes and orbital contents are non-suspicious.  Assessment:

## 2017-02-21 NOTE — Assessment & Plan Note (Signed)
02/21/2017  After extensive coaching DPI effectiveness =   75% > try BREO one click each am  - Spirometry 02/21/2017  FEV1 0.79 (54%)  Ratio 54 with classic curvature  - trial off inderal (see hbp)   Most likely this is chronic asthma, not copd, so BB more of an issue than would be with a non asthma pt   Will re-eval in 6 weeks

## 2017-02-21 NOTE — Patient Instructions (Addendum)
Stop inderol  Start bisoprolol 5 mg daily in its place and if blood pressure too low stop the losartan or check with PCP for advice as I don't think you will be able to break the bisoprolol in two   Stop advair Start BREO one click each am in its place  Work on inhaler technique:  relax and gently blow all the way out then take a nice smooth deep breath back in,  Hold for up to 5 seconds if you can. Blow out thru nose. Rinse and gargle with water when done  Please schedule a follow up office visit in 6 weeks, call sooner if needed

## 2017-02-21 NOTE — Assessment & Plan Note (Signed)
HCO3  01/31/17  = 32  C/w mild hypercarbia   rx as of 02/21/2017  = 2pm hs and prn with exertion

## 2017-02-21 NOTE — Assessment & Plan Note (Signed)
Strongly prefer in this setting: Bystolic, the most beta -1  selective Beta blocker available in sample form, with bisoprolol the most selective generic choice  on the market.   Try bisoprolol 5 mg daily and if too strong would reduce or stop the losartan rather than add back another bb other than bystolic until sort out to what extent that BB contributing to symptoms

## 2017-02-24 ENCOUNTER — Telehealth: Payer: Self-pay | Admitting: Adult Health

## 2017-02-24 NOTE — Telephone Encounter (Signed)
° ° ° ° ° °  Pt request refill of the following: ° °HYDROcodone-acetaminophen (NORCO/VICODIN) 5-325 MG tablet ° ° °Phamacy: °

## 2017-02-24 NOTE — Telephone Encounter (Signed)
Last filled 01/23/17 #30. Future appt on 06/10/17 Last seen on 02/05/17. Please advise.  Thanks!!

## 2017-02-25 MED ORDER — HYDROCODONE-ACETAMINOPHEN 5-325 MG PO TABS: 1.0000 | ORAL_TABLET | Freq: Every day | ORAL | 0 refills | Status: DC

## 2017-02-25 NOTE — Telephone Encounter (Signed)
Ok to refill for 30 days  

## 2017-02-25 NOTE — Telephone Encounter (Signed)
Printed for Cory to sign. 

## 2017-02-25 NOTE — Telephone Encounter (Signed)
[name] (daughter) informed to pick up at the front desk.

## 2017-02-26 NOTE — Telephone Encounter (Signed)
Pharmacist Sherren Mocha) calling to see if he could get pt Rx refilled that was sent in.

## 2017-02-27 NOTE — Telephone Encounter (Signed)
Rx already sent.

## 2017-02-28 ENCOUNTER — Other Ambulatory Visit: Payer: Self-pay | Admitting: Family Medicine

## 2017-02-28 ENCOUNTER — Other Ambulatory Visit: Payer: Self-pay | Admitting: Adult Health

## 2017-02-28 NOTE — Telephone Encounter (Signed)
Dexilant filled on 01/31/17 for #90.  Request is too early.  Tommi Rumps has not filled Zofran.  Will send a separate message request.

## 2017-02-28 NOTE — Telephone Encounter (Signed)
Do not see that Tommi Rumps has prescribed this medication in the past.  Will forward for authorization.

## 2017-02-28 NOTE — Telephone Encounter (Signed)
Misty pts daughter Juliann Pulse is calling needing clarification on medication

## 2017-02-28 NOTE — Telephone Encounter (Signed)
Pts daughter Lilyan [place]) is calling to see if she could get this medication Zofran today due to her getting ready to go out of town and want to set her mothers medication up so that pt will not be confused about her medication while she is gone.

## 2017-03-03 MED ORDER — ONDANSETRON HCL 4 MG PO TABS
2.0000 mg | ORAL_TABLET | Freq: Two times a day (BID) | ORAL | 0 refills | Status: DC
Start: 2017-03-03 — End: 2017-03-20

## 2017-03-03 NOTE — Telephone Encounter (Signed)
Sent to the pharmacy by e-scribe. 

## 2017-03-03 NOTE — Telephone Encounter (Signed)
Ok to refill 

## 2017-03-06 ENCOUNTER — Encounter: Payer: Self-pay | Admitting: Adult Health

## 2017-03-07 ENCOUNTER — Other Ambulatory Visit: Payer: Self-pay | Admitting: Family Medicine

## 2017-03-09 ENCOUNTER — Other Ambulatory Visit: Payer: Self-pay | Admitting: Adult Health

## 2017-03-12 ENCOUNTER — Encounter: Payer: Self-pay | Admitting: Adult Health

## 2017-03-13 MED ORDER — DEXLANSOPRAZOLE 60 MG PO CPDR: DELAYED_RELEASE_CAPSULE | ORAL | 1 refills | Status: DC

## 2017-03-20 ENCOUNTER — Encounter: Payer: Self-pay | Admitting: Adult Health

## 2017-03-20 ENCOUNTER — Other Ambulatory Visit: Payer: Self-pay | Admitting: Adult Health

## 2017-03-20 MED ORDER — ONDANSETRON HCL 4 MG PO TABS: 4.0000 mg | ORAL_TABLET | Freq: Once | ORAL | 1 refills | Status: DC

## 2017-03-24 ENCOUNTER — Encounter: Payer: Self-pay | Admitting: Adult Health

## 2017-03-25 ENCOUNTER — Other Ambulatory Visit: Payer: Self-pay | Admitting: Adult Health

## 2017-03-25 MED ORDER — HYDROCODONE-ACETAMINOPHEN 5-325 MG PO TABS: 1.0000 | ORAL_TABLET | Freq: Every day | ORAL | 0 refills | Status: DC

## 2017-03-27 NOTE — Progress Notes (Unsigned)
PT came with ID pt picked up script and signed log.

## 2017-04-15 ENCOUNTER — Ambulatory Visit: Payer: Medicare Other | Admitting: Internal Medicine

## 2017-04-28 ENCOUNTER — Telehealth: Payer: Self-pay | Admitting: Adult Health

## 2017-04-28 MED ORDER — HYDROCODONE-ACETAMINOPHEN 5-325 MG PO TABS: ORAL_TABLET | ORAL | 0 refills | Status: DC

## 2017-04-28 MED ORDER — HYDROCODONE-ACETAMINOPHEN 5-325 MG PO TABS: 1.0000 | ORAL_TABLET | Freq: Every day | ORAL | 0 refills | Status: DC

## 2017-04-28 NOTE — Telephone Encounter (Signed)
Pt needs new rx hydrocodone °

## 2017-04-28 NOTE — Telephone Encounter (Signed)
Lilyan Gilford (daughter) notified to pick up at the front desk.

## 2017-04-28 NOTE — Telephone Encounter (Signed)
Last filled 03/24/17 #30 Last seen on 02/05/17 Has follow up appt scheduled for 06/10/17. Please advise.  Thanks!!

## 2017-04-28 NOTE — Telephone Encounter (Signed)
Printed for [employer] to sign.

## 2017-04-28 NOTE — Telephone Encounter (Signed)
OK to refill for 3 months, separate prescriptions

## 2017-05-02 ENCOUNTER — Encounter: Payer: Self-pay | Admitting: Adult Health

## 2017-05-05 ENCOUNTER — Ambulatory Visit (INDEPENDENT_AMBULATORY_CARE_PROVIDER_SITE_OTHER): Payer: Medicare Other | Admitting: Orthopaedic Surgery

## 2017-05-05 DIAGNOSIS — M1611 Unilateral primary osteoarthritis, right hip: Secondary | ICD-10-CM

## 2017-05-05 DIAGNOSIS — M25551 Pain in right hip: Secondary | ICD-10-CM | POA: Diagnosis not present

## 2017-05-05 NOTE — Progress Notes (Signed)
Office Visit Note   Patient: [name]           Date of Birth: 09-Nov-1920           MRN: [id number] Visit Date: 05/05/2017              Requested by: [name], NP [address], [place] [address] PCP: [name], NP   Assessment & Plan: Visit Diagnoses:  1. Pain of right hip joint   2. Unilateral primary osteoarthritis, right hip     Plan: I do feel that hip replacement surgery would help decrease her pain, improve her mobility, and improve her quality of life. We had a long and thorough discussion about the surgery as well as a detailed discussion of what the surgery involves and the risk and benefits of this. Her daughter is with her as well and had a lot of notes and hurt about this. She sees her pulmonologist next week and saw do feel that we would need clearance for surgery. Also feel that she would be a good candidate for surgery based on seeing her as well as her x-ray findings and her clinical exam. I will see her back in follow-up to discuss this further. Gave her handout on hip replacement surgery as well. We had a discussion what her intraoperative and postoperative course would be I gave her handout on hip replacement surgery went over x-rays in detail.  Follow-Up Instructions: Return in about 3 weeks (around 05/26/2017).   Orders:  No orders of the defined types were placed in this encounter.  No orders of the defined types were placed in this encounter.     Procedures: No procedures performed   Clinical Data: No additional findings.   Subjective: No chief complaint on file. The patient is a very pleasant 83 year old female who comes in today as a referral to consider hip replacement surgery. She's been having hip pain for many years now. She has known severe osteoarthrosis of that hip and is gotten to the point where it is detrimentally affects her activity is daily living, her quality of life, and her mobility. She ambulates with a  walker. She lives in independent living facility and is very healthy and spry individual. She actually does see a pulmonologist she has a meeting with him next week. She is on Plavix as well. She has a primary care visit this month. She was to consider hip replacement surgery due to the fact that she is now miserable due to the pain she does with with her right hip.  HPI  Review of Systems She currently denies any chest pain, shortness of breath, fever, chills, nausea, vomiting. She is not on home oxygen.  Objective: Vital Signs: There were no vitals taken for this visit.  Physical Exam She is alert and oriented 3 and in no acute distress. She ambulates slowly with a walker. Ortho Exam Examination of her right hip shows severe pain with any attempts of internal right rotation. There is also limited motion due to the pain as well as due to the mechanical aspect of her hip in general. Her left hip exam is normal. Her right hip and leg are shoulder to left side. Specialty Comments:  No specialty comments available.  Imaging: No results found. X-rays independently reviewed of her right hip and pelvis show complete loss of joint space on the right side. The femoral head is actually flattened and slightly subluxing as well due to the severity  of arthritic changes. There is significant sclerosis and her trigger osteophytes.  PMFS History: Patient Active Problem List   Diagnosis Date Noted  . Pain of right hip joint 05/05/2017  . DOE (dyspnea on exertion) 02/21/2017  . Asthma, chronic, moderate persistent, uncomplicated 28/63/8177  . Chronic respiratory failure with hypoxia and hypercapnia ([place]) 02/21/2017  . Hypertensive urgency 01/30/2017  . Dizziness 01/30/2017  . Hyponatremia with extracellular fluid depletion 01/30/2017  . Hyponatremia 01/30/2017  . Lumbar spondylosis 01/28/2017  . Unilateral primary osteoarthritis, right hip 01/28/2017  . Arthritis   . Asthma   . Cataract   .  Colon cancer ([place])   . COPD (chronic obstructive pulmonary disease) ([place])   . Diabetes mellitus without complication ([place])   . Diverticulitis   . DVT (deep vein thrombosis) in pregnancy ([place])   . Essential hypertension   . GERD (gastroesophageal reflux disease)   . Hearing impaired   . Hyperlipidemia   . Hypertension   . TIA (transient ischemic attack)   . Seasonal allergies    Past Medical History:  Diagnosis Date  . Arthritis   . Asthma   . Cataract   . Chronic respiratory failure with hypoxia ([place])   . Colon cancer ([place])   . COPD (chronic obstructive pulmonary disease) ([place])   . Diabetes mellitus without complication ([place])   . Diverticulitis   . DVT (deep vein thrombosis) in pregnancy ([place])   . Essential hypertension   . GERD (gastroesophageal reflux disease)   . Hearing impaired   . Hiatal hernia   . Hyperlipidemia   . Hypertension   . Seasonal allergies   . TIA (transient ischemic attack)     Family History  Problem Relation Age of Onset  . Breast cancer Mother   . Heart attack Mother   . Emphysema Sister        smoked  . Prostate cancer Brother   . Heart disease Brother     Past Surgical History:  Procedure Laterality Date  . APPENDECTOMY    . CHOLECYSTECTOMY    . COLON RESECTION  1967  . KIDNEY SURGERY  1966   tumor   . TONSILLECTOMY    . TOTAL ABDOMINAL HYSTERECTOMY     Social History   Occupational History  . Not on file.   Social History Main Topics  . Smoking status: Never Smoker  . Smokeless tobacco: Never Used  . Alcohol use No  . Drug use: No  . Sexual activity: Not on file

## 2017-05-07 ENCOUNTER — Ambulatory Visit: Payer: Medicare Other | Admitting: Adult Health

## 2017-05-09 ENCOUNTER — Encounter: Payer: Self-pay | Admitting: Adult Health

## 2017-05-09 ENCOUNTER — Ambulatory Visit (INDEPENDENT_AMBULATORY_CARE_PROVIDER_SITE_OTHER): Payer: Medicare Other | Admitting: Adult Health

## 2017-05-09 VITALS — BP 140/80 | Temp 98.2°F | Ht 64.5 in | Wt 128.0 lb

## 2017-05-09 DIAGNOSIS — Z01818 Encounter for other preprocedural examination: Secondary | ICD-10-CM

## 2017-05-09 DIAGNOSIS — R42 Dizziness and giddiness: Secondary | ICD-10-CM | POA: Diagnosis not present

## 2017-05-09 LAB — CBC WITH DIFFERENTIAL/PLATELET
BASOS ABS: 0 10*3/uL (ref 0.0–0.1)
Basophils Relative: 0.5 % (ref 0.0–3.0)
EOS ABS: 0.1 10*3/uL (ref 0.0–0.7)
Eosinophils Relative: 1.3 % (ref 0.0–5.0)
HEMATOCRIT: 39.3 % (ref 36.0–46.0)
HEMOGLOBIN: 13.1 g/dL (ref 12.0–15.0)
LYMPHS PCT: 19.3 % (ref 12.0–46.0)
Lymphs Abs: 1.3 10*3/uL (ref 0.7–4.0)
MCHC: 33.2 g/dL (ref 30.0–36.0)
MCV: 95.6 fl (ref 78.0–100.0)
MONOS PCT: 10.6 % (ref 3.0–12.0)
Monocytes Absolute: 0.7 10*3/uL (ref 0.1–1.0)
NEUTROS PCT: 68.3 % (ref 43.0–77.0)
Neutro Abs: 4.8 10*3/uL (ref 1.4–7.7)
PLATELETS: 233 10*3/uL (ref 150.0–400.0)
RBC: 4.11 Mil/uL (ref 3.87–5.11)
RDW: 12.9 % (ref 11.5–15.5)
WBC: 7 10*3/uL (ref 4.0–10.5)

## 2017-05-09 LAB — BASIC METABOLIC PANEL
BUN: 14 mg/dL (ref 6–23)
CHLORIDE: 92 meq/L — AB (ref 96–112)
CO2: 38 mEq/L — ABNORMAL HIGH (ref 19–32)
Calcium: 9.8 mg/dL (ref 8.4–10.5)
Creatinine, Ser: 0.74 mg/dL (ref 0.40–1.20)
GFR: 77.63 mL/min (ref >60.00)
Glucose, Bld: 90 mg/dL (ref 70–99)
POTASSIUM: 4.8 meq/L (ref 3.5–5.1)
SODIUM: 136 meq/L (ref 135–145)

## 2017-05-09 NOTE — Progress Notes (Signed)
Subjective:    Patient ID: [name], female    DOB: 05-02-1921, 83 y.o.   MRN: [id number]  HPI   83 year old female who  has a past medical history of Arthritis; Asthma; Cataract; Chronic respiratory failure with hypoxia ([place]); Colon cancer ([place]); COPD (chronic obstructive pulmonary disease) ([place]); Diabetes mellitus without complication ([place]); Diverticulitis; DVT (deep vein thrombosis) in pregnancy ([place]); Essential hypertension; GERD (gastroesophageal reflux disease); Hearing impaired; Hiatal hernia; Hyperlipidemia; Hypertension; Seasonal allergies; and TIA (transient ischemic attack).   She presents to the office today for surgical clearance. She would like to have a right hip replacement. Dr. Rush Farmer will be doing the surgery. She is in good spirits and is excited to have the surgery so that she is hopefully in less pain.   She needs surgical clearance   She also complains of 3-4 days of vertigo. She reports getting vertigo often and this feels like her typical flares. Reports that when she wakes up that the room is spinning. Sensation is worse with change of positions and head movement   Review of Systems See HPI   Past Medical History:  Diagnosis Date  . Arthritis   . Asthma   . Cataract   . Chronic respiratory failure with hypoxia ([place])   . Colon cancer ([place])   . COPD (chronic obstructive pulmonary disease) ([place])   . Diabetes mellitus without complication ([place])   . Diverticulitis   . DVT (deep vein thrombosis) in pregnancy ([place])   . Essential hypertension   . GERD (gastroesophageal reflux disease)   . Hearing impaired   . Hiatal hernia   . Hyperlipidemia   . Hypertension   . Seasonal allergies   . TIA (transient ischemic attack)     Social History   Social History  . Marital status: Widowed    Spouse name: N/A  . Number of children: N/A  . Years of education: N/A   Occupational History  . Not on file.   Social History Main Topics  . Smoking status: Never  Smoker  . Smokeless tobacco: Never Used  . Alcohol use No  . Drug use: No  . Sexual activity: Not on file   Other Topics Concern  . Not on file   Social History Narrative  . No narrative on file    Past Surgical History:  Procedure Laterality Date  . APPENDECTOMY    . CHOLECYSTECTOMY    . COLON RESECTION  1967  . KIDNEY SURGERY  1966   tumor   . TONSILLECTOMY    . TOTAL ABDOMINAL HYSTERECTOMY      Family History  Problem Relation Age of Onset  . Breast cancer Mother   . Heart attack Mother   . Emphysema Sister        smoked  . Prostate cancer Brother   . Heart disease Brother     Allergies  Allergen Reactions  . Prochlorperazine Other (See Comments)    tremor  . Celecoxib Nausea And Vomiting and Other (See Comments)    Body aches  . Codeine Nausea And Vomiting    Tolerates hydrocodone  . Fish Oil Nausea And Vomiting  . Fosamax [Alendronate Sodium] Nausea And Vomiting  . Morphine And Related Nausea And Vomiting  . Moxifloxacin Other (See Comments)    fatigue  . Oxycodone Nausea And Vomiting    Tolerates hydrocodone  . Pregabalin Swelling  . Propoxyphene Nausea And Vomiting  . Statins Other (See Comments)    Muscle pain  .  Aspirin-Dipyridamole Er Other (See Comments)    Headcache  . Buprenorphine Hcl Nausea And Vomiting    Current Outpatient Prescriptions on File Prior to Visit  Medication Sig Dispense Refill  . acetaminophen (TYLENOL) 500 MG tablet Take 500 mg by mouth daily.    Marland Kitchen aspirin EC 81 MG tablet Take 81 mg by mouth daily. Take 1 tablet by mouth every Sunday and Thursday only.    . bisoprolol (ZEBETA) 5 MG tablet Take 1 tablet (5 mg total) by mouth daily. 30 tablet 11  . Calcium Carbonate-Vit D-Min (CALCIUM 600+D PLUS MINERALS) 600-400 MG-UNIT CHEW Chew 2 tablets by mouth every evening.     . Cholecalciferol (VITAMIN D3) 10000 UNITS capsule Take 10,000 Units by mouth See admin instructions. Take 1 capsule (10,000 units) Monday thru Thursday    .  clopidogrel (PLAVIX) 75 MG tablet Take 1 tablet (75 mg total) by mouth daily. 90 tablet 3  . dexlansoprazole (DEXILANT) 60 MG capsule TAKE 1CAP BY MOUTH EVERY MORNING BEFORE BREAKFAST. 90 capsule 1  . feeding supplement, ENSURE ENLIVE, (ENSURE ENLIVE) LIQD Take 237 mLs by mouth 2 (two) times daily between meals. 237 mL 12  . ferrous sulfate 325 (65 FE) MG tablet Take 325 mg by mouth daily with breakfast. Take Monday, Wednesday, and Friday  3  . fluticasone (FLONASE) 50 MCG/ACT nasal spray Place 1 spray into both nostrils 2 (two) times daily.   6  . fluticasone furoate-vilanterol (BREO ELLIPTA) 100-25 MCG/INH AEPB Inhale 1 puff into the lungs daily. 30 each 11  . gabapentin (NEURONTIN) 100 MG capsule Take 100 mg by mouth daily with lunch.    Marland Kitchen HYDROcodone-acetaminophen (NORCO/VICODIN) 5-325 MG tablet Take 1 tablet by mouth at bedtime. for pain 30 tablet 0  . ipratropium (ATROVENT) 0.03 % nasal spray Place 1 spray into both nostrils daily as needed for rhinitis.    Marland Kitchen loratadine (CLARITIN) 10 MG tablet Take 10 mg by mouth daily.     Marland Kitchen losartan (COZAAR) 50 MG tablet Take 1 tablet (50 mg total) by mouth daily. 30 tablet 11  . NASAL SPRAY SALINE NA Place 1 spray into both nostrils daily as needed (congestion).     . neomycin-polymyxin b-dexamethasone (MAXITROL) 3.5-10000-0.1 OINT Place 1 application into both eyes daily.   0  . primidone (MYSOLINE) 50 MG tablet TAKE 1/2 TABLET BY MOUTH TWICE A DAY 90 tablet 1  . Probiotic Product (PROBIOTIC PO) Take 1 capsule by mouth daily after lunch.     . Psyllium (METAMUCIL FIBER PO) Take 1 scoop by mouth daily.     Marland Kitchen albuterol (PROVENTIL HFA;VENTOLIN HFA) 108 (90 BASE) MCG/ACT inhaler Inhale 1 puff into the lungs every 4 (four) hours as needed for wheezing or shortness of breath.      No current facility-administered medications on file prior to visit.     BP 140/80 (BP Location: Right Arm)   Temp 98.2 F (36.8 C) (Oral)   Ht 5' 4.5" (1.638 m)   Wt 128 lb  (58.1 kg)   BMI 21.63 kg/m       Objective:   Physical Exam  Constitutional: She is oriented to person, place, and time. She appears well-developed and well-nourished. No distress.  Eyes: Right eye exhibits nystagmus (horizontal ). Left eye exhibits nystagmus (horizontal ).  Cardiovascular: Normal rate, regular rhythm, normal heart sounds and intact distal pulses.  Exam reveals no gallop and no friction rub.   No murmur heard. Pulmonary/Chest: Effort normal and breath sounds normal. No  respiratory distress. She has no wheezes. She has no rales. She exhibits no tenderness.  Musculoskeletal: She exhibits tenderness (right hip ). She exhibits no edema or deformity.  Uses rolling walker  Neurological: She is alert and oriented to person, place, and time. She has normal reflexes. She displays normal reflexes. No cranial nerve deficit. She exhibits normal muscle tone. Coordination normal.  Skin: Skin is warm and dry. No rash noted. She is not diaphoretic. No erythema. No pallor.  Nursing note and vitals reviewed.     Assessment & Plan:  1. Preop general physical exam - Chest x ray and EKG done in June. She is going to follow up with Pulmonary  - We discussed risks of surgery and stopping Plavix prior to surgery. She understands her risks with proceeding with surgery,including death. She is ok with this.   - CBC with Differential/Platelet - Basic Metabolic Panel  2. Vertigo - Ambulatory referral to Physical Therapy   [name], NP

## 2017-05-13 ENCOUNTER — Encounter: Payer: Self-pay | Admitting: Internal Medicine

## 2017-05-13 ENCOUNTER — Ambulatory Visit (INDEPENDENT_AMBULATORY_CARE_PROVIDER_SITE_OTHER): Payer: Medicare Other | Admitting: Internal Medicine

## 2017-05-13 VITALS — BP 124/76 | HR 60 | Ht 64.5 in | Wt 128.4 lb

## 2017-05-13 DIAGNOSIS — J9612 Chronic respiratory failure with hypercapnia: Secondary | ICD-10-CM | POA: Diagnosis not present

## 2017-05-13 DIAGNOSIS — J9611 Chronic respiratory failure with hypoxia: Secondary | ICD-10-CM

## 2017-05-13 DIAGNOSIS — J4540 Moderate persistent asthma, uncomplicated: Secondary | ICD-10-CM

## 2017-05-13 NOTE — Patient Instructions (Addendum)
You are cleared for surgery but try to get up post op as soon as they will let you   Pulmonary follow up is as needed

## 2017-05-13 NOTE — Progress Notes (Signed)
Subjective:     Patient ID: [name], female   DOB: [birth year],    MRN: [id number]    Brief patient profile:  16 yowf never regular smoker dx with dx of copd maint on advair "since it came out" (2001) referred to pulmonary clinic 02/21/2017 by [name] with ? Copd      History of Present Illness  02/21/2017 1st [facility] office visit/ [name]  On advair 250/50 bid and inderal/ 02 2lpm hs   Chief Complaint  Patient presents with  . Pulmonary Consult    Referred by [name] for eval of COPD. Pt states she was dxed with COPD approx 20 yrs ago. She states that she gets SOB walking a long distance, esp in humid weather. She has occ cough, but she does not find this bothersome.   chronic mild doe x 20 year but now new distance since April 2018 in  hall from her room to therapy room a little  > 100 ft but more limited by hips/ legs  When misses advair can't tell much difference with doe and not really consistent with it but also on inderol/ never uses saba cause it makes her too shaky  rec Stop inderol  Start bisoprolol 5 mg daily in its place and if blood pressure too low stop the losartan or check with PCP for advice as I don't think you will be able to break the bisoprolol in two  Stop advair Start BREO one click each am in its place Work on inhaler technique:  relax and gently blow all the way out then take a nice smooth deep breath back in,  Hold for up to 5 seconds if you can. Blow out thru nose. Rinse and gargle with water when done     05/13/2017  Pre op Consultation /[name] re: BREO in am / 02 2lpm hs  Chief Complaint  Patient presents with  . Follow-up    Pt needing pulmonary clearance for THR- right. Pt states her breathing is doing well. She only c/o runny nose.   able to lie flat/ no am cough /congestion or noct awakening  Much more limited by hip than sob  No need for saba at all   No obvious day to day or daytime variability or assoc excess/ purulent sputum  or mucus plugs or hemoptysis or cp or chest tightness, subjective wheeze or overt sinus or hb symptoms. No unusual exp hx or h/o childhood pna/ asthma or knowledge of premature birth.  Sleeping ok flat without nocturnal  or early am exacerbation  of respiratory  c/o's or need for noct saba. Also denies any obvious fluctuation of symptoms with weather or environmental changes or other aggravating or alleviating factors except as outlined above   Current Allergies, Complete Past Medical History, Past Surgical History, Family History, and Social History were reviewed in [employer] record.  ROS  The following are not active complaints unless bolded Hoarseness, sore throat, dysphagia, dental problems, itching, sneezing,  nasal congestion or discharge of excess mucus= watery x sev days or purulent secretions, ear ache,   fever, chills, sweats, unintended wt loss or wt gain, classically pleuritic or exertional cp,  orthopnea pnd or leg swelling, presyncope, palpitations, abdominal pain, anorexia, nausea, vomiting, diarrhea  or change in bowel habits or change in bladder habits, change in stools or change in urine, dysuria, hematuria,  rash, arthralgias, visual complaints, headache, numbness, weakness or ataxia or problems with walking or coordination,  change in mood/affect or memory.        Current Meds  Medication Sig  . acetaminophen (TYLENOL) 500 MG tablet Take 500 mg by mouth daily.  Marland Kitchen albuterol (PROVENTIL HFA;VENTOLIN HFA) 108 (90 BASE) MCG/ACT inhaler Inhale 1 puff into the lungs every 4 (four) hours as needed for wheezing or shortness of breath.   Marland Kitchen aspirin EC 81 MG tablet Take 81 mg by mouth daily. Take 1 tablet by mouth every Sunday and Thursday only.  . bisoprolol (ZEBETA) 5 MG tablet Take 1 tablet (5 mg total) by mouth daily.  . Calcium Carbonate-Vit D-Min (CALCIUM 600+D PLUS MINERALS) 600-400 MG-UNIT CHEW Chew 2 tablets by mouth every evening.   . Cholecalciferol  (VITAMIN D3) 10000 UNITS capsule Take 10,000 Units by mouth See admin instructions. Take 1 capsule (10,000 units) Monday thru Thursday  . clopidogrel (PLAVIX) 75 MG tablet Take 1 tablet (75 mg total) by mouth daily.  . dexlansoprazole (DEXILANT) 60 MG capsule TAKE 1CAP BY MOUTH EVERY MORNING BEFORE BREAKFAST.  . dimenhyDRINATE (DRAMAMINE) 50 MG tablet Take 50 mg by mouth every 8 (eight) hours as needed.  . feeding supplement, ENSURE ENLIVE, (ENSURE ENLIVE) LIQD Take 237 mLs by mouth 2 (two) times daily between meals.  . ferrous sulfate 325 (65 FE) MG tablet Take 325 mg by mouth daily with breakfast. Take Monday, Wednesday, and Friday  . fluticasone (FLONASE) 50 MCG/ACT nasal spray Place 1 spray into both nostrils 2 (two) times daily.   . fluticasone furoate-vilanterol (BREO ELLIPTA) 100-25 MCG/INH AEPB Inhale 1 puff into the lungs daily.  . gabapentin (NEURONTIN) 100 MG capsule Take 100 mg by mouth daily with lunch.  . HYDROcodone-acetaminophen (NORCO/VICODIN) 5-325 MG tablet Take 1 tablet by mouth at bedtime. for pain  . ipratropium (ATROVENT) 0.03 % nasal spray Place 1 spray into both nostrils daily as needed for rhinitis.  . loratadine (CLARITIN) 10 MG tablet Take 10 mg by mouth daily.   . losartan (COZAAR) 50 MG tablet Take 1 tablet (50 mg total) by mouth daily.  . NASAL SPRAY SALINE NA Place 1 spray into both nostrils daily as needed (congestion).   . ondansetron (ZOFRAN) 4 MG tablet TAKE 1 TABLET (4 MG TOTAL) BY MOUTH ONCE.  . primidone (MYSOLINE) 50 MG tablet TAKE 1/2 TABLET BY MOUTH TWICE A DAY  . Probiotic Product (PROBIOTIC PO) Take 1 capsule by mouth daily after lunch.   . Psyllium (METAMUCIL FIBER PO) Take 1 scoop by mouth daily.               Objective:   Physical Exam    pleasant mildly hoarse amb wf nad    05/13/2017       12 8   02/21/17 123 lb (55.8 kg)  02/05/17 121 lb 14.4 oz (55.3 kg)  01/30/17 125 lb (56.7 kg)    Vital signs reviewed - Note on arrival 02 sats  97%  on RA  And BP 124/76 / Pulse 60 p am bisoprolol 5 mg    HEENT: nl dentition, turbinates bilaterally, and oropharynx. Nl external ear canals without cough reflex   NECK :  without JVD/Nodes/TM/ nl carotid upstrokes bilaterally   LUNGS: no acc muscle use,  Nl contour chest with  Minimal rhonchi bilaterally late exp s cough on exp   CV:  RRR  no s3 or murmur or increase in P2, and no edema   ABD:  soft and nontender with nl inspiratory excursion in the supine position. No bruits or organomegaly  appreciated, bowel sounds nl  MS:  Slow unsteady gait/ ext warm without deformities, calf tenderness, cyanosis or clubbing     SKIN: warm and dry without lesions    NEURO:  alert, approp, nl sensorium with  no motor or cerebellar deficits apparent - min tremor     I personally reviewed images and agree with radiology impression as follows:  CXR:   01/31/17 COPD, cardiomegaly. Moderate to large hiatal hernia. No active disease.  Also MRI 01/30/17  The mastoid air-cells and included paranasal sinuses are well-aerated. The included ocular globes and orbital contents are non-suspicious.  Assessment:

## 2017-05-14 NOTE — Assessment & Plan Note (Signed)
HCO3  01/31/17  = 32    HC03   05/09/17 = 38     rx as of 05/13/2017  = 2pm hs and prn with exertion   The hypercarbia does not bother me except as an issue for anesthesia if needs ventilation not to over do it as she is prone to air stacking with high Rates and doesn't need a high Ve anyway.

## 2017-05-14 NOTE — Assessment & Plan Note (Addendum)
02/21/2017  After extensive coaching DPI effectiveness =   75% > try BREO one click each am  - Spirometry 02/21/2017  FEV1 0.79 (54%)  Ratio 54 with classic curvature  - trial off inderal (see hbp)  - Spirometry 05/13/2017  FEV1 1.01 (69%)  Ratio 64 with classic curvature  p am BREO    She still has criteria for moderate persistent asthma but no symptoms (limited more by R hip) and there is no red line in approving pts with airways dz for surgery but she is at moderately high risk for any GA at age 83 and I understand this will be done as spinal if possible.   The main challenge really is the post op care/ early mobilization/ minimization of narcs and I told her and her son they would need to understand/ accept the risks and work hard on the above issues and should do fine  I cleared her for surgery p full discussion of risk/ benefits vs the alternative (debilitation from R hip dz) - no change in rx but periop nebulized albuterol 2.5 mg qid prn may be helpful with inpt pulmonary f/u from our team available at any time  I had an extended discussion with the patient/son reviewing all relevant studies completed to date and assessing pre-op risks/ management as part of a preop consultation requested by surgery.  Each maintenance medication was reviewed in detail including most importantly the difference between maintenance and prns and under what circumstances the prns are to be triggered using an action plan format that is not reflected in the computer generated alphabetically organized AVS.    Please see AVS for specific instructions unique to this visit that I personally wrote and verbalized to the the pt in detail and then reviewed with pt  by my nurse highlighting any  changes in therapy recommended at today's visit to their plan of care.

## 2017-05-26 ENCOUNTER — Ambulatory Visit (INDEPENDENT_AMBULATORY_CARE_PROVIDER_SITE_OTHER): Payer: Medicare Other | Admitting: Orthopaedic Surgery

## 2017-05-26 DIAGNOSIS — M1611 Unilateral primary osteoarthritis, right hip: Secondary | ICD-10-CM | POA: Diagnosis not present

## 2017-05-26 NOTE — Progress Notes (Signed)
The patient is well-known to me. She is here to discuss again a right total hip arthroplasty. She is 83 years old. We had a long and thorough discussion of the last visit about the surgery including a thorough discussion of the risks and benefits of the surgery. She will remain answers a few more questions today and have the surgery set up at this point. She's been cleared by her primary care physician and her pulmonologist for this surgery. She is someone available which with a walker and is very independent. Her pain is daily and is detrimental effect direct is daily living, her quality of life, and her mobility.  On exam her leg lengths show that she is slightly shorter on the right and left. She has severe pain with rotation of that hip. X-rays also correlate with severe end-stage arthritis of the right hip. My last office visit note was done in detail as a relates to the severity of her arthritis of her right hip.  All questions and concerns were addressed and answered to this visit. She does wish to have the surgery set up soon. We'll work on getting this scheduled.

## 2017-06-10 ENCOUNTER — Ambulatory Visit: Payer: Medicare Other | Admitting: Adult Health

## 2017-06-26 ENCOUNTER — Other Ambulatory Visit (INDEPENDENT_AMBULATORY_CARE_PROVIDER_SITE_OTHER): Payer: Self-pay | Admitting: Physician Assistant

## 2017-06-30 NOTE — Patient Instructions (Addendum)
[name]  06/30/2017   Your procedure is scheduled on: 07-11-17  Report to [facility] Main  Entrance             Report to admitting at     645 AM   Call this number if you have problems the morning of surgery  [phone]   Remember: ONLY 1 PERSON MAY GO WITH YOU TO SHORT STAY TO GET  READY MORNING OF YOUR SURGERY.  Do not eat food or drink liquids :After Midnight.     Take these medicines the morning of surgery with A SIP OF WATER: Primidone, eye drops, zofran, claritin, inhalers and bring, dexilant, bisoprolol DO NOT TAKE ANY DIABETIC MEDICATIONS DAY OF YOUR SURGERY                               You may not have any metal on your body including hair pins and              piercings  Do not wear jewelry, make-up, lotions, powders or perfumes, deodorant             Do not wear nail polish.  Do not shave  48 hours prior to surgery.     Do not bring valuables to the hospital. [place].  Contacts, dentures or bridgework may not be worn into surgery.  Leave suitcase in the car. After surgery it may be brought to your room.                Please read over the following fact sheets you were given: _____________________________________________________________________           [facility] - Preparing for Surgery Before surgery, you can play an important role.  Because skin is not sterile, your skin needs to be as free of germs as possible.  You can reduce the number of germs on your skin by washing with CHG (chlorahexidine gluconate) soap before surgery.  CHG is an antiseptic cleaner which kills germs and bonds with the skin to continue killing germs even after washing. Please DO NOT use if you have an allergy to CHG or antibacterial soaps.  If your skin becomes reddened/irritated stop using the CHG and inform your nurse when you arrive at Short Stay. Do not shave (including legs and underarms) for at least  48 hours prior to the first CHG shower.  You may shave your face/neck. Please follow these instructions carefully:  1.  Shower with CHG Soap the night before surgery and the  morning of Surgery.  2.  If you choose to wash your hair, wash your hair first as usual with your  normal  shampoo.  3.  After you shampoo, rinse your hair and body thoroughly to remove the  shampoo.                           4.  Use CHG as you would any other liquid soap.  You can apply chg directly  to the skin and wash                       Gently with a scrungie or clean washcloth.  5.  Apply the CHG Soap to your body ONLY FROM THE NECK DOWN.   Do not use on face/ open                           Wound or open sores. Avoid contact with eyes, ears mouth and genitals (private parts).                       Wash face,  Genitals (private parts) with your normal soap.             6.  Wash thoroughly, paying special attention to the area where your surgery  will be performed.  7.  Thoroughly rinse your body with warm water from the neck down.  8.  DO NOT shower/wash with your normal soap after using and rinsing off  the CHG Soap.                9.  Pat yourself dry with a clean towel.            10.  Wear clean pajamas.            11.  Place clean sheets on your bed the night of your first shower and do not  sleep with pets. Day of Surgery : Do not apply any lotions/deodorants the morning of surgery.  Please wear clean clothes to the hospital/surgery center.  FAILURE TO FOLLOW THESE INSTRUCTIONS MAY RESULT IN THE CANCELLATION OF YOUR SURGERY PATIENT SIGNATURE_________________________________  NURSE SIGNATURE__________________________________  ________________________________________________________________________  WHAT IS A BLOOD TRANSFUSION? Blood Transfusion Information  A transfusion is the replacement of blood or some of its parts. Blood is made up of multiple cells which provide different functions.  Red blood  cells carry oxygen and are used for blood loss replacement.  White blood cells fight against infection.  Platelets control bleeding.  Plasma helps clot blood.  Other blood products are available for specialized needs, such as hemophilia or other clotting disorders. BEFORE THE TRANSFUSION  Who gives blood for transfusions?   Healthy volunteers who are fully evaluated to make sure their blood is safe. This is blood bank blood. Transfusion therapy is the safest it has ever been in the practice of medicine. Before blood is taken from a donor, a complete history is taken to make sure that person has no history of diseases nor engages in risky social behavior (examples are intravenous drug use or sexual activity with multiple partners). The donor's travel history is screened to minimize risk of transmitting infections, such as malaria. The donated blood is tested for signs of infectious diseases, such as HIV and hepatitis. The blood is then tested to be sure it is compatible with you in order to minimize the chance of a transfusion reaction. If you or a relative donates blood, this is often done in anticipation of surgery and is not appropriate for emergency situations. It takes many days to process the donated blood. RISKS AND COMPLICATIONS Although transfusion therapy is very safe and saves many lives, the main dangers of transfusion include:   Getting an infectious disease.  Developing a transfusion reaction. This is an allergic reaction to something in the blood you were given. Every precaution is taken to prevent this. The decision to have a blood transfusion has been considered carefully by your caregiver before blood is given. Blood is not given unless the benefits outweigh the risks. AFTER THE TRANSFUSION  Right after  receiving a blood transfusion, you will usually feel much better and more energetic. This is especially true if your red blood cells have gotten low (anemic). The transfusion  raises the level of the red blood cells which carry oxygen, and this usually causes an energy increase.  The nurse administering the transfusion will monitor you carefully for complications. HOME CARE INSTRUCTIONS  No special instructions are needed after a transfusion. You may find your energy is better. Speak with your caregiver about any limitations on activity for underlying diseases you may have. SEEK MEDICAL CARE IF:   Your condition is not improving after your transfusion.  You develop redness or irritation at the intravenous (IV) site. SEEK IMMEDIATE MEDICAL CARE IF:  Any of the following symptoms occur over the next 12 hours:  Shaking chills.  You have a temperature by mouth above 102 F (38.9 C), not controlled by medicine.  Chest, back, or muscle pain.  People around you feel you are not acting correctly or are confused.  Shortness of breath or difficulty breathing.  Dizziness and fainting.  You get a rash or develop hives.  You have a decrease in urine output.  Your urine turns a dark color or changes to pink, red, or brown. Any of the following symptoms occur over the next 10 days:  You have a temperature by mouth above 102 F (38.9 C), not controlled by medicine.  Shortness of breath.  Weakness after normal activity.  The white part of the eye turns yellow (jaundice).  You have a decrease in the amount of urine or are urinating less often.  Your urine turns a dark color or changes to pink, red, or brown. Document Released: 07/26/2000 Document Revised: 10/21/2011 Document Reviewed: 03/14/2008 ExitCare Patient Information 2014 [place].  _______________________________________________________________________  Incentive Spirometer  An incentive spirometer is a tool that can help keep your lungs clear and active. This tool measures how well you are filling your lungs with each breath. Taking long deep breaths may help reverse or decrease the chance  of developing breathing (pulmonary) problems (especially infection) following:  A long period of time when you are unable to move or be active. BEFORE THE PROCEDURE   If the spirometer includes an indicator to show your best effort, your nurse or respiratory therapist will set it to a desired goal.  If possible, sit up straight or lean slightly forward. Try not to slouch.  Hold the incentive spirometer in an upright position. INSTRUCTIONS FOR USE  1. Sit on the edge of your bed if possible, or sit up as far as you can in bed or on a chair. 2. Hold the incentive spirometer in an upright position. 3. Breathe out normally. 4. Place the mouthpiece in your mouth and seal your lips tightly around it. 5. Breathe in slowly and as deeply as possible, raising the piston or the ball toward the top of the column. 6. Hold your breath for 3-5 seconds or for as long as possible. Allow the piston or ball to fall to the bottom of the column. 7. Remove the mouthpiece from your mouth and breathe out normally. 8. Rest for a few seconds and repeat Steps 1 through 7 at least 10 times every 1-2 hours when you are awake. Take your time and take a few normal breaths between deep breaths. 9. The spirometer may include an indicator to show your best effort. Use the indicator as a goal to work toward during each repetition. 10. After each set  of 10 deep breaths, practice coughing to be sure your lungs are clear. If you have an incision (the cut made at the time of surgery), support your incision when coughing by placing a pillow or rolled up towels firmly against it. Once you are able to get out of bed, walk around indoors and cough well. You may stop using the incentive spirometer when instructed by your caregiver.  RISKS AND COMPLICATIONS  Take your time so you do not get dizzy or light-headed.  If you are in pain, you may need to take or ask for pain medication before doing incentive spirometry. It is harder to  take a deep breath if you are having pain. AFTER USE  Rest and breathe slowly and easily.  It can be helpful to keep track of a log of your progress. Your caregiver can provide you with a simple table to help with this. If you are using the spirometer at home, follow these instructions: [place] IF:   You are having difficultly using the spirometer.  You have trouble using the spirometer as often as instructed.  Your pain medication is not giving enough relief while using the spirometer.  You develop fever of 100.5 F (38.1 C) or higher. SEEK IMMEDIATE MEDICAL CARE IF:   You cough up bloody sputum that had not been present before.  You develop fever of 102 F (38.9 C) or greater.  You develop worsening pain at or near the incision site. MAKE SURE YOU:   Understand these instructions.  Will watch your condition.  Will get help right away if you are not doing well or get worse. Document Released: 12/09/2006 Document Revised: 10/21/2011 Document Reviewed: 02/09/2007 [facility] Patient Information 2014 [place], [place].   ________________________________________________________________________

## 2017-06-30 NOTE — Progress Notes (Signed)
Clearance 05-14-17 epic Dr. Melvyn Novas  (-28-18 clearance pcp epic  ekg 01-31-17 epic  cxr 01-31-17 epic

## 2017-07-01 ENCOUNTER — Encounter (HOSPITAL_COMMUNITY): Payer: Self-pay

## 2017-07-01 ENCOUNTER — Other Ambulatory Visit: Payer: Self-pay

## 2017-07-01 ENCOUNTER — Encounter (HOSPITAL_COMMUNITY)
Admission: RE | Admit: 2017-07-01 | Discharge: 2017-07-01 | Disposition: A | Discharge status: Home / Self Care | Payer: Medicare Other | Source: Ambulatory Visit | Attending: Orthopaedic Surgery | Admitting: Orthopaedic Surgery

## 2017-07-01 DIAGNOSIS — E781 Pure hyperglyceridemia: Secondary | ICD-10-CM | POA: Diagnosis not present

## 2017-07-01 DIAGNOSIS — Z86718 Personal history of other venous thrombosis and embolism: Secondary | ICD-10-CM | POA: Insufficient documentation

## 2017-07-01 DIAGNOSIS — J9611 Chronic respiratory failure with hypoxia: Secondary | ICD-10-CM | POA: Diagnosis not present

## 2017-07-01 DIAGNOSIS — I160 Hypertensive urgency: Secondary | ICD-10-CM | POA: Insufficient documentation

## 2017-07-01 DIAGNOSIS — J9612 Chronic respiratory failure with hypercapnia: Secondary | ICD-10-CM | POA: Insufficient documentation

## 2017-07-01 DIAGNOSIS — J449 Chronic obstructive pulmonary disease, unspecified: Secondary | ICD-10-CM | POA: Diagnosis not present

## 2017-07-01 DIAGNOSIS — E119 Type 2 diabetes mellitus without complications: Secondary | ICD-10-CM | POA: Insufficient documentation

## 2017-07-01 DIAGNOSIS — Z01812 Encounter for preprocedural laboratory examination: Secondary | ICD-10-CM | POA: Diagnosis not present

## 2017-07-01 DIAGNOSIS — M1611 Unilateral primary osteoarthritis, right hip: Secondary | ICD-10-CM | POA: Insufficient documentation

## 2017-07-01 HISTORY — DX: Noninfective gastroenteritis and colitis, unspecified: K52.9

## 2017-07-01 HISTORY — DX: Family history of other specified conditions: Z84.89

## 2017-07-01 HISTORY — DX: Pneumonia, unspecified organism: J18.9

## 2017-07-01 HISTORY — DX: Personal history of urinary calculi: Z87.442

## 2017-07-01 HISTORY — DX: Anemia, unspecified: D64.9

## 2017-07-01 HISTORY — DX: Other specified postprocedural states: R11.2

## 2017-07-01 HISTORY — DX: Acute embolism and thrombosis of unspecified deep veins of unspecified lower extremity: I82.409

## 2017-07-01 HISTORY — DX: Other specified postprocedural states: Z98.890

## 2017-07-01 LAB — CBC
HCT: 37.8 % (ref 36.0–46.0)
Hemoglobin: 12.5 g/dL (ref 12.0–15.0)
MCH: 31.5 pg (ref 26.0–34.0)
MCHC: 33.1 g/dL (ref 30.0–36.0)
MCV: 95.2 fL (ref 78.0–100.0)
PLATELETS: 197 10*3/uL (ref 150–400)
RBC: 3.97 MIL/uL (ref 3.87–5.11)
RDW: 12.5 % (ref 11.5–15.5)
WBC: 5.8 10*3/uL (ref 4.0–10.5)

## 2017-07-01 LAB — BASIC METABOLIC PANEL
Anion gap: 7 (ref 5–15)
BUN: 15 mg/dL (ref 6–20)
CALCIUM: 9.2 mg/dL (ref 8.9–10.3)
CO2: 35 mmol/L — ABNORMAL HIGH (ref 22–32)
CREATININE: 0.68 mg/dL (ref 0.44–1.00)
Chloride: 96 mmol/L — ABNORMAL LOW (ref 101–111)
GFR calc non Af Amer: >60 mL/min (ref >60)
Glucose, Bld: 108 mg/dL — ABNORMAL HIGH (ref 65–99)
Potassium: 5.1 mmol/L (ref 3.5–5.1)
SODIUM: 138 mmol/L (ref 135–145)

## 2017-07-01 LAB — ABO/RH: ABO/RH(D): A POS

## 2017-07-01 LAB — SURGICAL PCR SCREEN
MRSA, PCR: NEGATIVE
STAPHYLOCOCCUS AUREUS: NEGATIVE

## 2017-07-01 LAB — HEMOGLOBIN A1C
HEMOGLOBIN A1C: 5.7 % — AB (ref 4.8–5.6)
Mean Plasma Glucose: 116.89 mg/dL

## 2017-07-10 NOTE — Anesthesia Preprocedure Evaluation (Addendum)
Anesthesia Evaluation  Patient identified by MRN, date of birth, ID band Patient awake    Reviewed: Allergy & Precautions, NPO status , Patient's Chart, lab work & pertinent test results, reviewed documented beta blocker date and time   History of Anesthesia Complications (+) PONV and history of anesthetic complications  Airway Mallampati: II  TM Distance: >3 FB Neck ROM: Full    Dental no notable dental hx.    Pulmonary asthma ,  Sees Pulmonologist ([place]) On oxygen at night   Pulmonary exam normal breath sounds clear to auscultation       Cardiovascular hypertension, Pt. on home beta blockers and Pt. on medications Normal cardiovascular exam Rhythm:Regular Rate:Normal  ECG: NSR, rate 60   Neuro/Psych TIAnegative psych ROS   GI/Hepatic Neg liver ROS, hiatal hernia, GERD  Medicated and Controlled,  Endo/Other  diabetes  Renal/GU negative Renal ROS     Musculoskeletal  (+) Arthritis , Osteoarthritis,    Abdominal   Peds  Hematology negative hematology ROS (+)   Anesthesia Other Findings DVT (deep venous thrombosis)  Reproductive/Obstetrics                           Anesthesia Physical Anesthesia Plan  ASA: IV  Anesthesia Plan: Spinal   Post-op Pain Management:    Induction:   PONV Risk Score and Plan: 3 and Propofol infusion, Ondansetron and Dexamethasone  Airway Management Planned: Natural Airway  Additional Equipment:   Intra-op Plan:   Post-operative Plan:   Informed Consent: I have reviewed the patients History and Physical, chart, labs and discussed the procedure including the risks, benefits and alternatives for the proposed anesthesia with the patient or authorized representative who has indicated his/her understanding and acceptance.   Dental advisory given  Plan Discussed with: CRNA  Anesthesia Plan Comments: (Last plavix 8 days ago)      Anesthesia Quick  Evaluation

## 2017-07-11 ENCOUNTER — Inpatient Hospital Stay (HOSPITAL_COMMUNITY): Payer: Medicare Other | Admitting: Certified Registered Nurse Anesthetist

## 2017-07-11 ENCOUNTER — Inpatient Hospital Stay (HOSPITAL_COMMUNITY)
Admission: RE | Admit: 2017-07-11 | Discharge: 2017-07-14 | DRG: 470 | Disposition: A | Discharge status: Skilled Nursing Facility | Payer: Medicare Other | Source: Ambulatory Visit | Attending: Orthopaedic Surgery | Admitting: Orthopaedic Surgery

## 2017-07-11 ENCOUNTER — Encounter (HOSPITAL_COMMUNITY): Payer: Self-pay | Admitting: *Deleted

## 2017-07-11 ENCOUNTER — Encounter (HOSPITAL_COMMUNITY)
Admission: RE | Disposition: A | Discharge status: Skilled Nursing Facility | Payer: Self-pay | Source: Ambulatory Visit | Attending: Orthopaedic Surgery

## 2017-07-11 ENCOUNTER — Other Ambulatory Visit: Payer: Self-pay

## 2017-07-11 ENCOUNTER — Inpatient Hospital Stay (HOSPITAL_COMMUNITY): Payer: Medicare Other

## 2017-07-11 DIAGNOSIS — Z87442 Personal history of urinary calculi: Secondary | ICD-10-CM | POA: Diagnosis not present

## 2017-07-11 DIAGNOSIS — M1611 Unilateral primary osteoarthritis, right hip: Secondary | ICD-10-CM | POA: Diagnosis present

## 2017-07-11 DIAGNOSIS — Z8673 Personal history of transient ischemic attack (TIA), and cerebral infarction without residual deficits: Secondary | ICD-10-CM

## 2017-07-11 DIAGNOSIS — H9190 Unspecified hearing loss, unspecified ear: Secondary | ICD-10-CM | POA: Diagnosis present

## 2017-07-11 DIAGNOSIS — M25751 Osteophyte, right hip: Secondary | ICD-10-CM | POA: Diagnosis present

## 2017-07-11 DIAGNOSIS — I10 Essential (primary) hypertension: Secondary | ICD-10-CM | POA: Diagnosis present

## 2017-07-11 DIAGNOSIS — Z885 Allergy status to narcotic agent status: Secondary | ICD-10-CM

## 2017-07-11 DIAGNOSIS — E119 Type 2 diabetes mellitus without complications: Secondary | ICD-10-CM | POA: Diagnosis present

## 2017-07-11 DIAGNOSIS — Z888 Allergy status to other drugs, medicaments and biological substances status: Secondary | ICD-10-CM

## 2017-07-11 DIAGNOSIS — D62 Acute posthemorrhagic anemia: Secondary | ICD-10-CM | POA: Diagnosis not present

## 2017-07-11 DIAGNOSIS — Z9049 Acquired absence of other specified parts of digestive tract: Secondary | ICD-10-CM

## 2017-07-11 DIAGNOSIS — Z86718 Personal history of other venous thrombosis and embolism: Secondary | ICD-10-CM | POA: Diagnosis not present

## 2017-07-11 DIAGNOSIS — Z886 Allergy status to analgesic agent status: Secondary | ICD-10-CM | POA: Diagnosis not present

## 2017-07-11 DIAGNOSIS — Z85038 Personal history of other malignant neoplasm of large intestine: Secondary | ICD-10-CM

## 2017-07-11 DIAGNOSIS — Z85828 Personal history of other malignant neoplasm of skin: Secondary | ICD-10-CM

## 2017-07-11 DIAGNOSIS — D509 Iron deficiency anemia, unspecified: Secondary | ICD-10-CM | POA: Diagnosis present

## 2017-07-11 DIAGNOSIS — J449 Chronic obstructive pulmonary disease, unspecified: Secondary | ICD-10-CM | POA: Diagnosis present

## 2017-07-11 DIAGNOSIS — K219 Gastro-esophageal reflux disease without esophagitis: Secondary | ICD-10-CM | POA: Diagnosis present

## 2017-07-11 DIAGNOSIS — E785 Hyperlipidemia, unspecified: Secondary | ICD-10-CM | POA: Diagnosis present

## 2017-07-11 DIAGNOSIS — Z96641 Presence of right artificial hip joint: Secondary | ICD-10-CM

## 2017-07-11 DIAGNOSIS — Z90710 Acquired absence of both cervix and uterus: Secondary | ICD-10-CM

## 2017-07-11 DIAGNOSIS — Z419 Encounter for procedure for purposes other than remedying health state, unspecified: Secondary | ICD-10-CM

## 2017-07-11 HISTORY — PX: TOTAL HIP ARTHROPLASTY: SHX124

## 2017-07-11 LAB — GLUCOSE, CAPILLARY
GLUCOSE-CAPILLARY: 101 mg/dL — AB (ref 65–99)
GLUCOSE-CAPILLARY: 130 mg/dL — AB (ref 65–99)
Glucose-Capillary: 103 mg/dL — ABNORMAL HIGH (ref 65–99)

## 2017-07-11 LAB — TYPE AND SCREEN
ABO/RH(D): A POS
ANTIBODY SCREEN: NEGATIVE

## 2017-07-11 SURGERY — ARTHROPLASTY, HIP, TOTAL, ANTERIOR APPROACH
Anesthesia: Spinal | Site: Hip | Laterality: Right

## 2017-07-11 MED ORDER — POLYETHYL GLYCOL-PROPYL GLYCOL 0.4-0.3 % OP SOLN: 1.0000 [drp] | Freq: Four times a day (QID) | OPHTHALMIC | Status: DC

## 2017-07-11 MED ORDER — GABAPENTIN 100 MG PO CAPS
100.0000 mg | ORAL_CAPSULE | Freq: Every day | ORAL | Status: DC
  Administered 2017-07-11 – 2017-07-14 (×4): 100 mg via ORAL
  Filled 2017-07-11 (×4): qty 1

## 2017-07-11 MED ORDER — CEFAZOLIN SODIUM-DEXTROSE 2-4 GM/100ML-% IV SOLN
2.0000 g | INTRAVENOUS | Status: AC
  Administered 2017-07-11: 2 g via INTRAVENOUS

## 2017-07-11 MED ORDER — ONDANSETRON HCL 4 MG/2ML IJ SOLN: 4.0000 mg | Freq: Four times a day (QID) | INTRAMUSCULAR | Status: DC | PRN

## 2017-07-11 MED ORDER — PRIMIDONE 50 MG PO TABS
25.0000 mg | ORAL_TABLET | Freq: Two times a day (BID) | ORAL | Status: DC
  Administered 2017-07-11 – 2017-07-14 (×6): 25 mg via ORAL
  Filled 2017-07-11 (×6): qty 0.5

## 2017-07-11 MED ORDER — PHENYLEPHRINE HCL 10 MG/ML IJ SOLN
INTRAMUSCULAR | Status: AC
  Filled 2017-07-11: qty 1

## 2017-07-11 MED ORDER — BISOPROLOL FUMARATE 5 MG PO TABS
5.0000 mg | ORAL_TABLET | Freq: Every day | ORAL | Status: DC
Start: 2017-07-12 — End: 2017-07-14
  Administered 2017-07-13: 5 mg via ORAL
  Filled 2017-07-11 (×3): qty 1

## 2017-07-11 MED ORDER — PHENYLEPHRINE HCL 10 MG/ML IJ SOLN
INTRAVENOUS | Status: DC | PRN
  Administered 2017-07-11: 20 ug/min via INTRAVENOUS

## 2017-07-11 MED ORDER — OXYCODONE HCL 5 MG PO TABS
5.0000 mg | ORAL_TABLET | ORAL | Status: DC | PRN
  Administered 2017-07-11: 16:00:00 5 mg via ORAL
  Filled 2017-07-11: qty 1

## 2017-07-11 MED ORDER — LOSARTAN POTASSIUM 50 MG PO TABS
50.0000 mg | ORAL_TABLET | Freq: Every day | ORAL | Status: DC
  Administered 2017-07-13: 10:00:00 50 mg via ORAL
  Filled 2017-07-11 (×3): qty 1

## 2017-07-11 MED ORDER — FLUTICASONE PROPIONATE 50 MCG/ACT NA SUSP
1.0000 | Freq: Two times a day (BID) | NASAL | Status: DC | PRN
  Administered 2017-07-12: 1 via NASAL
  Filled 2017-07-11: qty 16

## 2017-07-11 MED ORDER — METHOCARBAMOL 500 MG PO TABS
500.0000 mg | ORAL_TABLET | Freq: Four times a day (QID) | ORAL | Status: DC | PRN
  Administered 2017-07-12 – 2017-07-13 (×2): 500 mg via ORAL
  Filled 2017-07-11 (×2): qty 1

## 2017-07-11 MED ORDER — ONDANSETRON HCL 4 MG/2ML IJ SOLN: 4.0000 mg | Freq: Once | INTRAMUSCULAR | Status: DC | PRN

## 2017-07-11 MED ORDER — ROCURONIUM BROMIDE 50 MG/5ML IV SOSY
PREFILLED_SYRINGE | INTRAVENOUS | Status: AC
  Filled 2017-07-11: qty 5

## 2017-07-11 MED ORDER — LORATADINE 10 MG PO TABS
10.0000 mg | ORAL_TABLET | Freq: Every day | ORAL | Status: DC
  Administered 2017-07-12 – 2017-07-14 (×3): 10 mg via ORAL
  Filled 2017-07-11 (×3): qty 1

## 2017-07-11 MED ORDER — DIPHENHYDRAMINE HCL 12.5 MG/5ML PO ELIX: 12.5000 mg | ORAL_SOLUTION | ORAL | Status: DC | PRN

## 2017-07-11 MED ORDER — LACTATED RINGERS IV SOLN
INTRAVENOUS | Status: DC
  Administered 2017-07-11 (×2): via INTRAVENOUS

## 2017-07-11 MED ORDER — ONDANSETRON HCL 4 MG/2ML IJ SOLN
INTRAMUSCULAR | Status: AC
Start: 2017-07-11 — End: ?
  Filled 2017-07-11: qty 2

## 2017-07-11 MED ORDER — SODIUM CHLORIDE 0.9 % IV SOLN
INTRAVENOUS | Status: DC
  Administered 2017-07-11 – 2017-07-12 (×2): via INTRAVENOUS

## 2017-07-11 MED ORDER — DIMENHYDRINATE 50 MG PO TABS
50.0000 mg | ORAL_TABLET | Freq: Three times a day (TID) | ORAL | Status: DC | PRN
  Filled 2017-07-11: qty 1

## 2017-07-11 MED ORDER — ONDANSETRON HCL 4 MG/2ML IJ SOLN
INTRAMUSCULAR | Status: DC | PRN
  Administered 2017-07-11: 4 mg via INTRAVENOUS

## 2017-07-11 MED ORDER — PANTOPRAZOLE SODIUM 40 MG PO TBEC
40.0000 mg | DELAYED_RELEASE_TABLET | Freq: Every day | ORAL | Status: DC
  Administered 2017-07-12 – 2017-07-14 (×3): 40 mg via ORAL
  Filled 2017-07-11 (×3): qty 1

## 2017-07-11 MED ORDER — FENTANYL CITRATE (PF) 100 MCG/2ML IJ SOLN
INTRAMUSCULAR | Status: AC
  Filled 2017-07-11: qty 2

## 2017-07-11 MED ORDER — METOCLOPRAMIDE HCL 5 MG PO TABS: 5.0000 mg | ORAL_TABLET | Freq: Three times a day (TID) | ORAL | Status: DC | PRN

## 2017-07-11 MED ORDER — CEFAZOLIN SODIUM-DEXTROSE 1-4 GM/50ML-% IV SOLN
1.0000 g | Freq: Four times a day (QID) | INTRAVENOUS | Status: AC
  Administered 2017-07-11 (×2): 1 g via INTRAVENOUS
  Filled 2017-07-11 (×2): qty 50

## 2017-07-11 MED ORDER — FENTANYL CITRATE (PF) 100 MCG/2ML IJ SOLN
25.0000 ug | INTRAMUSCULAR | Status: DC | PRN
  Administered 2017-07-11: 25 ug via INTRAVENOUS
  Administered 2017-07-11: 50 ug via INTRAVENOUS

## 2017-07-11 MED ORDER — IPRATROPIUM BROMIDE 0.03 % NA SOLN
1.0000 | Freq: Every day | NASAL | Status: DC
  Filled 2017-07-11: qty 30

## 2017-07-11 MED ORDER — DEXAMETHASONE SODIUM PHOSPHATE 10 MG/ML IJ SOLN
INTRAMUSCULAR | Status: DC | PRN
  Administered 2017-07-11: 10 mg via INTRAVENOUS

## 2017-07-11 MED ORDER — PROPOFOL 500 MG/50ML IV EMUL
INTRAVENOUS | Status: DC | PRN
  Administered 2017-07-11: 25 ug/kg/min via INTRAVENOUS

## 2017-07-11 MED ORDER — CHLORHEXIDINE GLUCONATE 4 % EX LIQD: 60.0000 mL | Freq: Once | CUTANEOUS | Status: DC

## 2017-07-11 MED ORDER — BUPIVACAINE IN DEXTROSE 0.75-8.25 % IT SOLN
INTRATHECAL | Status: DC | PRN
  Administered 2017-07-11: 1.6 mL via INTRATHECAL

## 2017-07-11 MED ORDER — CALCIUM CARBONATE-VITAMIN D 500-200 MG-UNIT PO TABS
2.0000 | ORAL_TABLET | Freq: Every day | ORAL | Status: DC
  Administered 2017-07-12 – 2017-07-14 (×3): 2 via ORAL
  Filled 2017-07-11 (×3): qty 2

## 2017-07-11 MED ORDER — MENTHOL 3 MG MT LOZG: 1.0000 | LOZENGE | OROMUCOSAL | Status: DC | PRN

## 2017-07-11 MED ORDER — CEFAZOLIN SODIUM-DEXTROSE 2-4 GM/100ML-% IV SOLN
INTRAVENOUS | Status: AC
  Filled 2017-07-11: qty 100

## 2017-07-11 MED ORDER — ALBUTEROL SULFATE (2.5 MG/3ML) 0.083% IN NEBU: 3.0000 mL | INHALATION_SOLUTION | RESPIRATORY_TRACT | Status: DC | PRN

## 2017-07-11 MED ORDER — LIDOCAINE 2% (20 MG/ML) 5 ML SYRINGE
INTRAMUSCULAR | Status: AC
  Filled 2017-07-11: qty 5

## 2017-07-11 MED ORDER — PROPOFOL 10 MG/ML IV BOLUS
INTRAVENOUS | Status: DC | PRN
  Administered 2017-07-11 (×3): 10 mg via INTRAVENOUS

## 2017-07-11 MED ORDER — METOCLOPRAMIDE HCL 5 MG/ML IJ SOLN: 5.0000 mg | Freq: Three times a day (TID) | INTRAMUSCULAR | Status: DC | PRN

## 2017-07-11 MED ORDER — ACETAMINOPHEN 325 MG PO TABS: 650.0000 mg | ORAL_TABLET | ORAL | Status: DC | PRN

## 2017-07-11 MED ORDER — DEXAMETHASONE SODIUM PHOSPHATE 10 MG/ML IJ SOLN
INTRAMUSCULAR | Status: AC
  Filled 2017-07-11: qty 1

## 2017-07-11 MED ORDER — POLYVINYL ALCOHOL 1.4 % OP SOLN
1.0000 [drp] | OPHTHALMIC | Status: DC | PRN
  Administered 2017-07-12: 10:00:00 1 [drp] via OPHTHALMIC
  Filled 2017-07-11: qty 15

## 2017-07-11 MED ORDER — HYDROMORPHONE HCL 1 MG/ML IJ SOLN
0.5000 mg | INTRAMUSCULAR | Status: DC | PRN
  Administered 2017-07-11: 12:00:00 0.5 mg via INTRAVENOUS
  Filled 2017-07-11: qty 0.5

## 2017-07-11 MED ORDER — HYDROCODONE-ACETAMINOPHEN 5-325 MG PO TABS
1.0000 | ORAL_TABLET | ORAL | Status: DC | PRN
  Administered 2017-07-11 – 2017-07-14 (×10): 1 via ORAL
  Filled 2017-07-11 (×10): qty 1

## 2017-07-11 MED ORDER — ALUM & MAG HYDROXIDE-SIMETH 200-200-20 MG/5ML PO SUSP: 30.0000 mL | ORAL | Status: DC | PRN

## 2017-07-11 MED ORDER — POLYETHYLENE GLYCOL 3350 17 G PO PACK: 17.0000 g | PACK | Freq: Every day | ORAL | Status: DC | PRN

## 2017-07-11 MED ORDER — PHENOL 1.4 % MT LIQD
1.0000 | OROMUCOSAL | Status: DC | PRN
  Filled 2017-07-11: qty 177

## 2017-07-11 MED ORDER — SUGAMMADEX SODIUM 200 MG/2ML IV SOLN
INTRAVENOUS | Status: AC
  Filled 2017-07-11: qty 2

## 2017-07-11 MED ORDER — PROPOFOL 10 MG/ML IV BOLUS
INTRAVENOUS | Status: AC
  Filled 2017-07-11: qty 20

## 2017-07-11 MED ORDER — METHOCARBAMOL 1000 MG/10ML IJ SOLN
500.0000 mg | Freq: Four times a day (QID) | INTRAVENOUS | Status: DC | PRN
  Administered 2017-07-11: 500 mg via INTRAVENOUS
  Filled 2017-07-11: qty 550

## 2017-07-11 MED ORDER — ONDANSETRON HCL 4 MG PO TABS: 4.0000 mg | ORAL_TABLET | Freq: Four times a day (QID) | ORAL | Status: DC | PRN

## 2017-07-11 MED ORDER — ACETAMINOPHEN 650 MG RE SUPP: 650.0000 mg | RECTAL | Status: DC | PRN

## 2017-07-11 MED ORDER — DOCUSATE SODIUM 100 MG PO CAPS
100.0000 mg | ORAL_CAPSULE | Freq: Two times a day (BID) | ORAL | Status: DC
  Administered 2017-07-11 – 2017-07-14 (×6): 100 mg via ORAL
  Filled 2017-07-11 (×6): qty 1

## 2017-07-11 MED ORDER — FENTANYL CITRATE (PF) 100 MCG/2ML IJ SOLN
INTRAMUSCULAR | Status: DC | PRN
  Administered 2017-07-11 (×2): 25 ug via INTRAVENOUS

## 2017-07-11 MED ORDER — SODIUM CHLORIDE 0.9 % IR SOLN
Status: DC | PRN
  Administered 2017-07-11: 1000 mL

## 2017-07-11 MED ORDER — FLUTICASONE FUROATE-VILANTEROL 100-25 MCG/INH IN AEPB
1.0000 | INHALATION_SPRAY | Freq: Every day | RESPIRATORY_TRACT | Status: DC
  Administered 2017-07-12 – 2017-07-14 (×3): 1 via RESPIRATORY_TRACT
  Filled 2017-07-11: qty 28

## 2017-07-11 MED ORDER — ASPIRIN 81 MG PO CHEW
81.0000 mg | CHEWABLE_TABLET | Freq: Every day | ORAL | Status: DC
  Administered 2017-07-11 – 2017-07-14 (×4): 81 mg via ORAL
  Filled 2017-07-11 (×4): qty 1

## 2017-07-11 MED ORDER — CALCIUM 600+D PLUS MINERALS 600-400 MG-UNIT PO CHEW: 2.0000 | CHEWABLE_TABLET | Freq: Every morning | ORAL | Status: DC

## 2017-07-11 MED ORDER — FERROUS SULFATE 325 (65 FE) MG PO TABS
325.0000 mg | ORAL_TABLET | ORAL | Status: DC
  Administered 2017-07-11 – 2017-07-14 (×2): 325 mg via ORAL
  Filled 2017-07-11 (×2): qty 1

## 2017-07-11 MED ORDER — CLOPIDOGREL BISULFATE 75 MG PO TABS
75.0000 mg | ORAL_TABLET | Freq: Every day | ORAL | Status: DC
  Administered 2017-07-11 – 2017-07-14 (×4): 75 mg via ORAL
  Filled 2017-07-11 (×4): qty 1

## 2017-07-11 SURGICAL SUPPLY — 36 items
BAG ZIPLOCK 12X15 (MISCELLANEOUS) IMPLANT
BENZOIN TINCTURE PRP APPL 2/3 (GAUZE/BANDAGES/DRESSINGS) ×3 IMPLANT
BLADE SAW SGTL 18X1.27X75 (BLADE) ×2 IMPLANT
BLADE SAW SGTL 18X1.27X75MM (BLADE) ×1
CAPT HIP TOTAL 2 ×3 IMPLANT
CELLS DAT CNTRL 66122 CELL SVR (MISCELLANEOUS) IMPLANT
CLOSURE WOUND 1/2 X4 (GAUZE/BANDAGES/DRESSINGS) ×1
COVER PERINEAL POST (MISCELLANEOUS) ×3 IMPLANT
COVER SURGICAL LIGHT HANDLE (MISCELLANEOUS) ×3 IMPLANT
DRAPE STERI IOBAN 125X83 (DRAPES) ×3 IMPLANT
DRAPE U-SHAPE 47X51 STRL (DRAPES) ×6 IMPLANT
DRSG AQUACEL AG ADV 3.5X10 (GAUZE/BANDAGES/DRESSINGS) ×3 IMPLANT
DURAPREP 26ML APPLICATOR (WOUND CARE) ×3 IMPLANT
ELECT REM PT RETURN 15FT ADLT (MISCELLANEOUS) ×3 IMPLANT
GAUZE XEROFORM 1X8 LF (GAUZE/BANDAGES/DRESSINGS) IMPLANT
GLOVE BIO SURGEON STRL SZ7.5 (GLOVE) ×3 IMPLANT
GLOVE BIOGEL PI IND STRL 8 (GLOVE) ×2 IMPLANT
GLOVE BIOGEL PI INDICATOR 8 (GLOVE) ×4
GLOVE ECLIPSE 8.0 STRL XLNG CF (GLOVE) ×3 IMPLANT
GOWN STRL REUS W/TWL XL LVL3 (GOWN DISPOSABLE) ×6 IMPLANT
HANDPIECE INTERPULSE COAX TIP (DISPOSABLE) ×2
HEAD FEMORAL 32 CERAMIC (Hips) ×3 IMPLANT
HOLDER FOLEY CATH W/STRAP (MISCELLANEOUS) ×3 IMPLANT
PACK ANTERIOR HIP CUSTOM (KITS) ×3 IMPLANT
RTRCTR WOUND ALEXIS 18CM MED (MISCELLANEOUS)
SET HNDPC FAN SPRY TIP SCT (DISPOSABLE) ×1 IMPLANT
STAPLER VISISTAT 35W (STAPLE) IMPLANT
STRIP CLOSURE SKIN 1/2X4 (GAUZE/BANDAGES/DRESSINGS) ×2 IMPLANT
SUT ETHIBOND NAB CT1 #1 30IN (SUTURE) ×3 IMPLANT
SUT MNCRL AB 4-0 PS2 18 (SUTURE) IMPLANT
SUT VIC AB 0 CT1 36 (SUTURE) ×3 IMPLANT
SUT VIC AB 1 CT1 36 (SUTURE) ×3 IMPLANT
SUT VIC AB 2-0 CT1 27 (SUTURE) ×4
SUT VIC AB 2-0 CT1 TAPERPNT 27 (SUTURE) ×2 IMPLANT
TRAY FOLEY W/METER SILVER 16FR (SET/KITS/TRAYS/PACK) ×3 IMPLANT
YANKAUER SUCT BULB TIP 10FT TU (MISCELLANEOUS) ×3 IMPLANT

## 2017-07-11 NOTE — Transfer of Care (Signed)
Immediate Anesthesia Transfer of Care Note  Patient: [name]  Procedure(s) Performed: RIGHT TOTAL HIP ARTHROPLASTY ANTERIOR APPROACH (Right Hip)  Patient Location: PACU  Anesthesia Type:Spinal  Level of Consciousness: awake, alert  and patient cooperative  Airway & Oxygen Therapy: Patient Spontanous Breathing and Patient connected to face mask  Post-op Assessment: Report given to RN and Post -op Vital signs reviewed and stable  Post vital signs: Reviewed and stable  Last Vitals:  Vitals:   07/11/17 0653  BP: (!) 172/67  Pulse: 60  Resp: 18  Temp: 36.8 C  SpO2: 94%    Last Pain:  Vitals:   07/11/17 0653  TempSrc: Oral      Patients Stated Pain Goal: 3 (16/10/96 0454)  Complications: No apparent anesthesia complications

## 2017-07-11 NOTE — Brief Op Note (Signed)
07/11/2017  10:12 AM  PATIENT:  [name]  83 y.o. female  PRE-OPERATIVE DIAGNOSIS:  osteoarthritis right hip  POST-OPERATIVE DIAGNOSIS:  RIGHT HIP OSTEOARTHRITIS  PROCEDURE:  Procedure(s): RIGHT TOTAL HIP ARTHROPLASTY ANTERIOR APPROACH (Right)  SURGEON:  Surgeon(s) and Role:    Mcarthur Rossetti, MD - Primary  PHYSICIAN ASSISTANT: Benita Stabile, PA-C  ANESTHESIA:   spinal  EBL:  300 mL   COUNTS:  YES  DICTATION: .Other Dictation: Dictation Number [id number]  PLAN OF CARE: Admit to inpatient   PATIENT DISPOSITION:  PACU - hemodynamically stable.   Delay start of Pharmacological VTE agent (>24hrs) due to surgical blood loss or risk of bleeding: no

## 2017-07-11 NOTE — Evaluation (Signed)
Physical Therapy Evaluation Patient Details Name: [name] MRN: [id number] DOB: 02/06/1921 Today's Date: 07/11/2017   History of Present Illness  Pt s/p R THR   Clinical Impression  Pt s/p R THR and presents with decreased R LE strength/ROM, post op pain and orthostatic BP on eval limiting functional mobility.  Pt would benefit from follow up rehab at SNF level to maximize IND and safety prior to return to IND living facility with PRN assist.    Follow Up Recommendations SNF    Equipment Recommendations  None recommended by PT    Recommendations for Other Services OT consult     Precautions / Restrictions Precautions Precautions: Fall Restrictions Weight Bearing Restrictions: No      Mobility  Bed Mobility Overal bed mobility: Needs Assistance Bed Mobility: Supine to Sit     Supine to sit: Min assist;Mod assist;+2 for physical assistance;+2 for safety/equipment     General bed mobility comments: cues for sequence and use of L LE to self assist.  Physical assist to manage L LE and to bring trunk to upright  Transfers Overall transfer level: Needs assistance Equipment used: Rolling walker (2 wheeled) Transfers: Sit to/from Stand Sit to Stand: Min assist;Mod assist;+2 physical assistance;+2 safety/equipment;From elevated surface         General transfer comment: cues for LE management and use of UEs to self assist  Ambulation/Gait Ambulation/Gait assistance: Min assist;Mod assist;+2 physical assistance;+2 safety/equipment Ambulation Distance (Feet): 8 Feet Assistive device: Rolling walker (2 wheeled) Gait Pattern/deviations: Step-to pattern;Decreased step length - right;Decreased step length - left;Shuffle;Trunk flexed Gait velocity: decr Gait velocity interpretation: Below normal speed for age/gender General Gait Details: cues for sequence, posture and position from RW - distance ltd by onset dizziness with BP 83/37  Stairs            Wheelchair  Mobility    Modified Rankin (Stroke Patients Only)       Balance Overall balance assessment: Needs assistance Sitting-balance support: Feet supported;No upper extremity supported Sitting balance-Leahy Scale: Fair     Standing balance support: Bilateral upper extremity supported Standing balance-Leahy Scale: Poor                               Pertinent Vitals/Pain Pain Assessment: 0-10 Pain Score: 3  Pain Location: R hip Pain Descriptors / Indicators: Aching;Sore Pain Intervention(s): Limited activity within patient's tolerance;Monitored during session;Premedicated before session;Ice applied    Home Living Family/patient expects to be discharged to:: [place]: Other (Comment)(IND living) Available Help at Discharge: Family;Available PRN/intermittently Type of Home: Apartment Home Access: Level entry     Home Layout: One level Home Equipment: Walker - 2 wheels;Walker - 4 wheels;Grab bars - toilet;Grab bars - tub/shower Additional Comments: Dependent on progress, pt could go home with dtr with 3 stairs and R rail    Prior Function Level of Independence: Independent with assistive device(s)         Comments: drives and is independent in ADL's, dtr/son help with IADL's     Hand Dominance        Extremity/Trunk Assessment   Upper Extremity Assessment Upper Extremity Assessment: Overall WFL for tasks assessed    Lower Extremity Assessment Lower Extremity Assessment: RLE deficits/detail       Communication   Communication: HOH  Cognition Arousal/Alertness: Awake/alert Behavior During Therapy: WFL for tasks assessed/performed Overall Cognitive Status: Within Functional Limits for tasks assessed  General Comments      Exercises     Assessment/Plan    PT Assessment Patient needs continued PT services  PT Problem List Decreased strength;Decreased  range of motion;Decreased activity tolerance;Decreased balance;Decreased mobility;Decreased knowledge of use of DME;Pain;Decreased knowledge of precautions       PT Treatment Interventions DME instruction;Gait training;Stair training;Functional mobility training;Therapeutic activities;Therapeutic exercise;Patient/family education    PT Goals (Current goals can be found in the Care Plan section)  Acute Rehab PT Goals Patient Stated Goal: Walk with less pain PT Goal Formulation: With patient Time For Goal Achievement: 07/15/17 Potential to Achieve Goals: Fair    Frequency 7X/week   Barriers to discharge        Co-evaluation               AM-PAC PT "6 Clicks" Daily Activity  Outcome Measure Difficulty turning over in bed (including adjusting bedclothes, sheets and blankets)?: Unable Difficulty moving from lying on back to sitting on the side of the bed? : Unable Difficulty sitting down on and standing up from a chair with arms (e.g., wheelchair, bedside commode, etc,.)?: Unable Help needed moving to and from a bed to chair (including a wheelchair)?: A Lot Help needed walking in hospital room?: A Lot Help needed climbing 3-5 steps with a railing? : A Lot 6 Click Score: 9    End of Session Equipment Utilized During Treatment: Gait belt Activity Tolerance: Patient limited by fatigue;Other (comment)(orthostatic) Patient left: in chair;with call bell/phone within reach;with family/visitor present Nurse Communication: Mobility status PT Visit Diagnosis: Unsteadiness on feet (R26.81);Difficulty in walking, not elsewhere classified (R26.2);Dizziness and giddiness (R42)    Time: 1275-1700 PT Time Calculation (min) (ACUTE ONLY): 24 min   Charges:   PT Evaluation $PT Eval Low Complexity: 1 Low PT Treatments $Gait Training: 8-22 mins   PT G Codes:        Pg [phone]   Uriel Horkey 07/11/2017, 6:48 PM

## 2017-07-11 NOTE — H&P (Signed)
TOTAL HIP ADMISSION H&P  Patient is admitted for right total hip arthroplasty.  Subjective:  Chief Complaint: right hip pain  HPI: [name], 83 y.o. female, has a history of pain and functional disability in the right hip(s) due to arthritis and patient has failed non-surgical conservative treatments for greater than 12 weeks to include NSAID's and/or analgesics, corticosteriod injections, flexibility and strengthening excercises, supervised PT with diminished ADL's post treatment, use of assistive devices and activity modification.  Onset of symptoms was gradual starting 3 years ago with gradually worsening course since that time.The patient noted no past surgery on the right hip(s).  Patient currently rates pain in the right hip at 10 out of 10 with activity. Patient has night pain, worsening of pain with activity and weight bearing, trendelenberg gait, pain that interfers with activities of daily living and pain with passive range of motion. Patient has evidence of subchondral cysts, subchondral sclerosis, periarticular osteophytes and joint space narrowing by imaging studies. This condition presents safety issues increasing the risk of falls.  There is no current active infection.  Patient Active Problem List   Diagnosis Date Noted  . Status post total replacement of right hip 07/11/2017  . Pain of right hip joint 05/05/2017  . DOE (dyspnea on exertion) 02/21/2017  . Asthma, chronic, moderate persistent, uncomplicated 24/04/7352  . Chronic respiratory failure with hypoxia and hypercapnia ([place]) 02/21/2017  . Hypertensive urgency 01/30/2017  . Dizziness 01/30/2017  . Hyponatremia with extracellular fluid depletion 01/30/2017  . Hyponatremia 01/30/2017  . Lumbar spondylosis 01/28/2017  . Unilateral primary osteoarthritis, right hip 01/28/2017  . Arthritis   . Asthma   . Cataract   . Colon cancer ([place])   . COPD (chronic obstructive pulmonary disease) ([place])   . Diabetes mellitus  without complication ([place])   . Diverticulitis   . DVT (deep vein thrombosis) in pregnancy ([place])   . Essential hypertension   . GERD (gastroesophageal reflux disease)   . Hearing impaired   . Hyperlipidemia   . Hypertension   . TIA (transient ischemic attack)   . Seasonal allergies    Past Medical History:  Diagnosis Date  . Anemia    iron deficiency  . Arthritis   . Asthma   . Cataract   . Chronic respiratory failure with hypoxia ([place])   . Colitis   . Colon cancer ([place])    basal cell nose  . Diabetes mellitus without complication ([place])    NO meds ever [place] related to steroid use. Has more problems eith hypoglycemia  . Diverticulitis   . DVT (deep venous thrombosis) ([place])   . Essential hypertension   . Family history of adverse reaction to anesthesia    both daughters have problems getting bp up after anesthesia  . GERD (gastroesophageal reflux disease)   . Hearing impaired   . Hiatal hernia    neuropathy  . History of kidney stones   . Hyperlipidemia   . Hypertension   . Pneumonia   . PONV (postoperative nausea and vomiting)    BP drops really low cant breathe well vomiting  . Seasonal allergies   . TIA (transient ischemic attack)    x2 2 years ago    Past Surgical History:  Procedure Laterality Date  . APPENDECTOMY    . CHOLECYSTECTOMY    . COLON RESECTION  1967  . EYE SURGERY     cataract  . JOINT REPLACEMENT     Right hip Dr. Ninfa Linden 07/11/17  . KIDNEY  SURGERY  1966   cyst  . TONSILLECTOMY    . TOTAL ABDOMINAL HYSTERECTOMY      Current Facility-Administered Medications  Medication Dose Route Frequency Provider Last Rate Last Dose  . ceFAZolin (ANCEF) IVPB 2g/100 mL premix  2 g Intravenous On Call to OR Pete Pelt, PA-C      . chlorhexidine (HIBICLENS) 4 % liquid 4 application  60 mL Topical Once Erskine Emery W, PA-C      . lactated ringers infusion   Intravenous Continuous Ellender, Karyl Kinnier, MD       Allergies  Allergen Reactions  .  Prochlorperazine Other (See Comments)    tremor  . Celecoxib Nausea And Vomiting and Other (See Comments)    Body aches  . Codeine Nausea And Vomiting    Tolerates hydrocodone  . Fish Oil Nausea And Vomiting  . Fosamax [Alendronate Sodium] Nausea And Vomiting  . Morphine And Related Nausea And Vomiting  . Moxifloxacin Other (See Comments)    fatigue  . Oxycodone Nausea And Vomiting    Tolerates hydrocodone  . Pregabalin Swelling  . Propoxyphene Nausea And Vomiting  . Statins Other (See Comments)    Muscle pain  . Aspirin-Dipyridamole Er Other (See Comments)    Headcache  . Buprenorphine Hcl Nausea And Vomiting    Social History   Tobacco Use  . Smoking status: Never Smoker  . Smokeless tobacco: Never Used  Substance Use Topics  . Alcohol use: No    Family History  Problem Relation Age of Onset  . Breast cancer Mother   . Heart attack Mother   . Emphysema Sister        smoked  . Prostate cancer Brother   . Heart disease Brother      Review of Systems  Musculoskeletal: Positive for joint pain.  All other systems reviewed and are negative.   Objective:  Physical Exam  Constitutional: She is oriented to person, place, and time. She appears well-developed and well-nourished.  HENT:  Head: Normocephalic and atraumatic.  Eyes: EOM are normal. Pupils are equal, round, and reactive to light.  Neck: Normal range of motion. Neck supple.  Cardiovascular: Normal rate and regular rhythm.  Respiratory: Effort normal and breath sounds normal.  GI: Soft. Bowel sounds are normal.  Musculoskeletal:       Right hip: She exhibits decreased range of motion, decreased strength, tenderness and bony tenderness.  Neurological: She is alert and oriented to person, place, and time.  Skin: Skin is warm and dry.  Psychiatric: She has a normal mood and affect.    Vital signs in last 24 hours: Temp:  [98.2 F (36.8 C)] 98.2 F (36.8 C) (11/30 0653) Pulse Rate:  [60] 60 (11/30  0653) Resp:  [18] 18 (11/30 0653) BP: (172)/(67) 172/67 (11/30 0653) SpO2:  [94 %] 94 % (11/30 0653)  Labs:   Estimated body mass index is 22.66 kg/m as calculated from the following:   Height as of 07/01/17: 5\' 4"  (1.626 m).   Weight as of 07/01/17: 132 lb (59.9 kg).   Imaging Review Plain radiographs demonstrate severe degenerative joint disease of the right hip(s). The bone quality appears to be good for age and reported activity level.  Assessment/Plan:  End stage arthritis, right hip(s)  The patient history, physical examination, clinical judgement of the provider and imaging studies are consistent with end stage degenerative joint disease of the right hip(s) and total hip arthroplasty is deemed medically necessary. The treatment options including  medical management, injection therapy, arthroscopy and arthroplasty were discussed at length. The risks and benefits of total hip arthroplasty were presented and reviewed. The risks due to aseptic loosening, infection, stiffness, dislocation/subluxation,  thromboembolic complications and other imponderables were discussed.  The patient acknowledged the explanation, agreed to proceed with the plan and consent was signed. Patient is being admitted for inpatient treatment for surgery, pain control, PT, OT, prophylactic antibiotics, VTE prophylaxis, progressive ambulation and ADL's and discharge planning.The patient is planning to be discharged home with home health services vs skilled nursing.

## 2017-07-11 NOTE — Anesthesia Procedure Notes (Signed)
Spinal  Start time: 07/11/2017 8:48 AM End time: 07/11/2017 8:52 AM Staffing Anesthesiologist: Murvin Natal, MD Resident/CRNA: Montel Clock, CRNA Performed: resident/CRNA  Preanesthetic Checklist Completed: patient identified, surgical consent, pre-op evaluation, timeout performed, IV checked, risks and benefits discussed and monitors and equipment checked Spinal Block Patient position: sitting Prep: DuraPrep Patient monitoring: heart rate, cardiac monitor, continuous pulse ox and blood pressure Approach: midline Location: L3-4 Needle Needle type: Pencan  Needle gauge: 24 G Needle length: 10 cm Needle insertion depth: 7 cm Assessment Sensory level: T6

## 2017-07-12 LAB — CBC
HCT: 26.8 % — ABNORMAL LOW (ref 36.0–46.0)
HEMOGLOBIN: 9.1 g/dL — AB (ref 12.0–15.0)
MCH: 31.8 pg (ref 26.0–34.0)
MCHC: 34 g/dL (ref 30.0–36.0)
MCV: 93.7 fL (ref 78.0–100.0)
Platelets: 154 10*3/uL (ref 150–400)
RBC: 2.86 MIL/uL — AB (ref 3.87–5.11)
RDW: 12.6 % (ref 11.5–15.5)
WBC: 9.5 10*3/uL (ref 4.0–10.5)

## 2017-07-12 LAB — BASIC METABOLIC PANEL
ANION GAP: 6 (ref 5–15)
BUN: 15 mg/dL (ref 6–20)
CHLORIDE: 98 mmol/L — AB (ref 101–111)
CO2: 31 mmol/L (ref 22–32)
Calcium: 8.3 mg/dL — ABNORMAL LOW (ref 8.9–10.3)
Creatinine, Ser: 0.7 mg/dL (ref 0.44–1.00)
GFR calc non Af Amer: >60 mL/min (ref >60)
Glucose, Bld: 131 mg/dL — ABNORMAL HIGH (ref 65–99)
POTASSIUM: 4.5 mmol/L (ref 3.5–5.1)
SODIUM: 135 mmol/L (ref 135–145)

## 2017-07-12 NOTE — Progress Notes (Signed)
Physical Therapy Treatment Patient Details Name: [name] MRN: [id number] DOB: Sep 28, 1920 Today's Date: 07/12/2017    History of Present Illness Pt s/p R THR     PT Comments    Pt progressing well with mobility this pm and initiated therex program.  No c/o dizziness this session.   Follow Up Recommendations  SNF     Equipment Recommendations  None recommended by PT    Recommendations for Other Services OT consult     Precautions / Restrictions Precautions Precautions: Fall Restrictions Weight Bearing Restrictions: No Other Position/Activity Restrictions: WBAT    Mobility  Bed Mobility Overal bed mobility: Needs Assistance Bed Mobility: Sit to Supine       Sit to supine: Min assist   General bed mobility comments: cues for sequence and min assist to manage R LE  Transfers Overall transfer level: Needs assistance Equipment used: Rolling walker (2 wheeled) Transfers: Sit to/from Stand Sit to Stand: Min assist         General transfer comment: cues for LE management and use of UEs to self assist; Stand pvt BSC to bed  Ambulation/Gait Ambulation/Gait assistance: Min assist Ambulation Distance (Feet): 100 Feet Assistive device: Rolling walker (2 wheeled) Gait Pattern/deviations: Step-to pattern;Step-through pattern;Decreased step length - right;Decreased step length - left;Shuffle;Trunk flexed;Antalgic Gait velocity: decr Gait velocity interpretation: Below normal speed for age/gender General Gait Details: cues for sequence, posture and position from RW   Stairs            Wheelchair Mobility    Modified Rankin (Stroke Patients Only)       Balance Overall balance assessment: Needs assistance Sitting-balance support: Feet supported;No upper extremity supported Sitting balance-Leahy Scale: Good     Standing balance support: Bilateral upper extremity supported Standing balance-Leahy Scale: Poor                               Cognition Arousal/Alertness: Awake/alert Behavior During Therapy: WFL for tasks assessed/performed Overall Cognitive Status: Within Functional Limits for tasks assessed                                        Exercises Total Joint Exercises Ankle Circles/Pumps: AROM;Both;20 reps;Supine Quad Sets: AROM;Both;10 reps;Supine Heel Slides: AAROM;Right;20 reps;Supine Hip ABduction/ADduction: AAROM;Right;15 reps;Supine    General Comments General comments (skin integrity, edema, etc.): Ambulation in hall with chair follow; reported some dizziness during ambulation. BP 150/52, O2 98% on 2L via [place], HR 65-71      Pertinent Vitals/Pain Pain Assessment: 0-10 Pain Score: 4  Pain Location: R hip/thigh Pain Descriptors / Indicators: Aching;Sore Pain Intervention(s): Limited activity within patient's tolerance;Monitored during session;RN gave pain meds during session;Ice applied    Home Living Family/patient expects to be discharged to:: [place]: Other (Comment)(IND living) Available Help at Discharge: Family;Available PRN/intermittently Type of Home: Apartment Home Access: Level entry   Home Layout: One level Home Equipment: Walker - 2 wheels;Walker - 4 wheels;Grab bars - toilet;Grab bars - tub/shower Additional Comments: Dependent on progress, pt could go home with dtr with 3 stairs and R rail    Prior Function Level of Independence: Independent with assistive device(s)      Comments: drives and is independent in ADL's, dtr/son help with IADL's   PT Goals (current goals can now be found in the care plan section) Acute  Rehab PT Goals Patient Stated Goal: Walk with less pain PT Goal Formulation: With patient Time For Goal Achievement: 07/15/17 Potential to Achieve Goals: Fair Progress towards PT goals: Progressing toward goals    Frequency    7X/week      PT Plan Current plan remains appropriate    Co-evaluation PT/OT/SLP  Co-Evaluation/Treatment: Yes Reason for Co-Treatment: For patient/therapist safety PT goals addressed during session: Mobility/safety with mobility OT goals addressed during session: ADL's and self-care      AM-PAC PT "6 Clicks" Daily Activity  Outcome Measure  Difficulty turning over in bed (including adjusting bedclothes, sheets and blankets)?: Unable Difficulty moving from lying on back to sitting on the side of the bed? : Unable Difficulty sitting down on and standing up from a chair with arms (e.g., wheelchair, bedside commode, etc,.)?: Unable Help needed moving to and from a bed to chair (including a wheelchair)?: A Little Help needed walking in hospital room?: A Little Help needed climbing 3-5 steps with a railing? : A Little 6 Click Score: 12    End of Session Equipment Utilized During Treatment: Gait belt Activity Tolerance: Patient tolerated treatment well Patient left: in bed;with call bell/phone within reach;with family/visitor present Nurse Communication: Mobility status PT Visit Diagnosis: Unsteadiness on feet (R26.81);Difficulty in walking, not elsewhere classified (R26.2);Dizziness and giddiness (R42)     Time: 2725-3664 PT Time Calculation (min) (ACUTE ONLY): 42 min  Charges:  $Gait Training: 8-22 mins $Therapeutic Exercise: 8-22 mins $Therapeutic Activity: 8-22 mins                    G Codes:       Pg [phone]    [place] List 07/12/2017, 3:48 PM

## 2017-07-12 NOTE — Discharge Instructions (Signed)

## 2017-07-12 NOTE — Op Note (Signed)
[name], [name] NO.:  [id number]  MEDICAL RECORD NO.:  [id number]  LOCATION:  [facility]                         FACILITY:  [facility]  PHYSICIAN:  Lind Guest. Ninfa Linden, M.D.DATE OF BIRTH:  [birth year]  DATE OF PROCEDURE:  07/11/2017 DATE OF DISCHARGE:                              OPERATIVE REPORT   PREOPERATIVE DIAGNOSIS:  Severe primary osteoarthritis and degenerative joint disease, right hip.  POSTOPERATIVE DIAGNOSIS:  Severe primary osteoarthritis and degenerative joint disease, right hip.  PROCEDURE:  Right total hip arthroplasty through direct anterior approach.  IMPLANTS:  DePuy Sector Gription acetabular component size 50 with a single screw, size 32 +4 polyethylene liner, size 14 Corail femoral component with standard offset, size 32 +5 ceramic hip ball.  SURGEON:  Lind Guest. Ninfa Linden, MD.  ASSISTANT:  Erskine Emery, PA-C.  ANESTHESIA:  Spinal.  ANTIBIOTICS:  IV Ancef 2 g.  BLOOD LOSS:  300 mL.  COMPLICATIONS:  None.  INDICATIONS:  Ms. Colan is a very spry and active 83 year old female, who is in really good health.  She lives independently and actually drives as well.  She has severe debilitating arthritis of her right hip.  This worsened for years now.  She is on minimal medications, but her pain is severe and daily.  She is starting to become a fall risk and has detrimentally affected her activities of daily living, her quality of life, her mobility.  At this point, she does wish to proceed with a total hip arthroplasty and her primary care physician has signed off on this as well because despite of her age, she is physiologically younger. Again, she understands the risk of acute blood loss anemia, nerve and vessel injury, fracture, infection, dislocation, DVT.  She understands our goals are to decrease pain, improve mobility, and overall, improve quality of life.  X-rays of her pelvis and right hip do show a subluxed hip joint with  severe arthritis in that hip.  Her left hip appears normal.  PROCEDURE DESCRIPTION:  After informed consent was obtained, appropriate right leg was marked.  She was brought to the operating room, where spinal anesthesia was obtained while she was on her stretcher.  A Foley catheter was placed, then both feet had traction boots applied to them. Of note, she has significant short on her right operative side.  We placed her next supine on the Hana fracture table, the perineal post in place, and both legs in InLine skeletal traction devices, but no traction applied.  Her right operative hip was prepped and draped with DuraPrep and sterile drapes.  A time-out was called and she was identified as correct patient and correct right hip.  I then made an incision just inferior and posterior to the anterior superior iliac spine and carried this obliquely down the leg.  We dissected down to tensor fascia lata muscle.  The tensor fascia was then divided longitudinally to proceed with a direct anterior approach to the hip. We identified and cauterized circumflex vessels and then identified the hip capsule.  I opened up the hip capsule, finding significant deformity of her hip and a large hip effusion as well.  We placed Cobra retractors  around the lateral medial femoral neck and then made our femoral neck cut proximal to the lesser trochanter and completed this on osteotome. I placed a corkscrew guide in the femoral head and removed the femoral head in its entirety and found it to be completely devoid of cartilage. We then cleaned the acetabular remnants of the acetabular labrum and other debris.  I placed a bent Hohmann over the medial acetabular rim and then began reaming under direct visualization from a size 42 reamer going up to size 50 in stepwise increments with last reamer also under direct fluoroscopy, so we could obtain our depth of reaming, our inclination, and anteversion.  Once we were  pleased with this, we placed through real DePuy Sector Gription acetabular component size 50 and a single screw.  We placed a 32 +4 polyethylene liner for that size acetabular component.  Attention was then turned to the femur.  With the leg externally rotated to 120 degrees extended and adducted, we were able to place a Mueller retractor medially and a Hohmann retractor behind the greater trochanter.  We released the lateral joint capsule and used a box cutting osteotome to enter from canal and a rongeur to lateralize.  We then began broaching from a size 8 broach using the Corail broaching system going up to a size 14.  With a 14 in place, we trialed a standard offset femoral neck and a 32 +1 hip ball, reduced this in the acetabulum, and I was pleased with the range of motion, stability, and leg length.  We then dislocated the hip and removed the trial components.  I then placed the real Corail femoral component size 14 with standard offset and the real 36 +1.5 ceramic hip ball.  We brought the leg back over and up with traction and internal rotation reducing the pelvis, and I felt like at that point, we needed to have her just a little bit more tight and a little bit more leg length, so we dislocated the hip and removed the real 32 +1 hip ball and went with a 32 +5, reduced this in the acetabulum.  We were definitely pleased with leg length, offset, range of motion, and stability.  We then irrigated the soft tissue with normal saline solution using pulsatile lavage.  We closed the joint capsule with interrupted #1 Ethibond suture, followed by running #1 Vicryl in tensor fascia, 0 Vicryl in the deep tissue, 2-0 Vicryl in the subcutaneous tissue, 4-0 Monocryl subcuticular stitch and Steri-Strips on the skin.  An Aquacel dressing was applied.  She was taken to the recovery room in stable condition.  All final counts were correct.  There were no complications noted.     Lind Guest.  Ninfa Linden, M.D.     CYB/MEDQ  D:  07/11/2017  T:  07/12/2017  Job:  [id number]

## 2017-07-12 NOTE — Progress Notes (Signed)
Physical Therapy Treatment Patient Details Name: [name] MRN: [id number] DOB: Nov 14, 1920 Today's Date: 07/12/2017    History of Present Illness Pt s/p R THR     PT Comments    Pt very cooperative and progressing with mobility.  Ltd this am 2* mild dizziness with ambulation - BP 150/52, SaO2 98% on 2L.   Follow Up Recommendations  SNF     Equipment Recommendations  None recommended by PT    Recommendations for Other Services OT consult     Precautions / Restrictions Precautions Precautions: Fall Restrictions Weight Bearing Restrictions: No Other Position/Activity Restrictions: WBAT    Mobility  Bed Mobility               General bed mobility comments: NT -- OOB in recliner upon arrival  Transfers Overall transfer level: Needs assistance Equipment used: Rolling walker (2 wheeled) Transfers: Sit to/from Stand Sit to Stand: Min assist;+2 physical assistance;+2 safety/equipment         General transfer comment: cues for LE management and use of UEs to self assist  Ambulation/Gait Ambulation/Gait assistance: Min assist;+2 safety/equipment Ambulation Distance (Feet): 38 Feet Assistive device: Rolling walker (2 wheeled) Gait Pattern/deviations: Step-to pattern;Decreased step length - right;Decreased step length - left;Shuffle;Trunk flexed Gait velocity: decr   General Gait Details: cues for sequence, posture and position from RW - distance ltd by onset mild dizziness - BP 150/52   Stairs            Wheelchair Mobility    Modified Rankin (Stroke Patients Only)       Balance                                            Cognition Arousal/Alertness: Awake/alert Behavior During Therapy: WFL for tasks assessed/performed Overall Cognitive Status: Within Functional Limits for tasks assessed                                        Exercises      General Comments General comments (skin integrity, edema, etc.):  Ambulation in hall with chair follow; reported some dizziness during ambulation. BP 150/52, O2 98% on 2L via [place], HR 65-71      Pertinent Vitals/Pain Pain Assessment: 0-10 Pain Score: 3  Pain Location: R hip Pain Descriptors / Indicators: Aching;Sore Pain Intervention(s): Limited activity within patient's tolerance;Monitored during session;Premedicated before session;Ice applied    Home Living Family/patient expects to be discharged to:: [place]: Other (Comment)(IND living) Available Help at Discharge: Family;Available PRN/intermittently Type of Home: Apartment Home Access: Level entry   Home Layout: One level Home Equipment: Walker - 2 wheels;Walker - 4 wheels;Grab bars - toilet;Grab bars - tub/shower Additional Comments: Dependent on progress, pt could go home with dtr with 3 stairs and R rail    Prior Function Level of Independence: Independent with assistive device(s)      Comments: drives and is independent in ADL's, dtr/son help with IADL's   PT Goals (current goals can now be found in the care plan section) Acute Rehab PT Goals Patient Stated Goal: Walk with less pain PT Goal Formulation: With patient Time For Goal Achievement: 07/15/17 Potential to Achieve Goals: Fair Progress towards PT goals: Progressing toward goals    Frequency    7X/week  PT Plan Current plan remains appropriate    Co-evaluation PT/OT/SLP Co-Evaluation/Treatment: Yes Reason for Co-Treatment: For patient/therapist safety PT goals addressed during session: Mobility/safety with mobility OT goals addressed during session: ADL's and self-care      AM-PAC PT "6 Clicks" Daily Activity  Outcome Measure  Difficulty turning over in bed (including adjusting bedclothes, sheets and blankets)?: Unable Difficulty moving from lying on back to sitting on the side of the bed? : Unable Difficulty sitting down on and standing up from a chair with arms (e.g.,  wheelchair, bedside commode, etc,.)?: Unable Help needed moving to and from a bed to chair (including a wheelchair)?: A Lot Help needed walking in hospital room?: A Little Help needed climbing 3-5 steps with a railing? : A Lot 6 Click Score: 10    End of Session Equipment Utilized During Treatment: Gait belt Activity Tolerance: Patient tolerated treatment well;Patient limited by fatigue Patient left: in chair;with call bell/phone within reach;with family/visitor present Nurse Communication: Mobility status PT Visit Diagnosis: Unsteadiness on feet (R26.81);Difficulty in walking, not elsewhere classified (R26.2);Dizziness and giddiness (R42)     Time: 7867-5449 PT Time Calculation (min) (ACUTE ONLY): 24 min  Charges:  $Gait Training: 8-22 mins                    G Codes:       Pg [phone]    [name] 07/12/2017, 2:27 PM

## 2017-07-12 NOTE — Progress Notes (Signed)
Subjective: 1 Day Post-Op Procedure(s) (LRB): RIGHT TOTAL HIP ARTHROPLASTY ANTERIOR APPROACH (Right) Patient reports pain as moderate.  Acute blood loss anemia from surgery - tolerating it well thus far.  Has been up with therapy.  Objective: Vital signs in last 24 hours: Temp:  [97.3 F (36.3 C)-98.4 F (36.9 C)] 97.8 F (36.6 C) (12/01 1013) Pulse Rate:  [45-65] 65 (12/01 1013) Resp:  [15-16] 16 (12/01 1013) BP: (107-135)/(44-71) 125/71 (12/01 1013) SpO2:  [97 %-100 %] 99 % (12/01 1020)  Intake/Output from previous day: 11/30 0701 - 12/01 0700 In: 4313.8 [P.O.:1180; I.V.:3028.8; IV Piggyback:105] Out: 2200 [Urine:1900; Blood:300] Intake/Output this shift: Total I/O In: 240 [P.O.:240] Out: -   Recent Labs    07/12/17 0521  HGB 9.1*   Recent Labs    07/12/17 0521  WBC 9.5  RBC 2.86*  HCT 26.8*  PLT 154   Recent Labs    07/12/17 0521  NA 135  K 4.5  CL 98*  CO2 31  BUN 15  CREATININE 0.70  GLUCOSE 131*  CALCIUM 8.3*   No results for input(s): LABPT, INR in the last 72 hours.  Sensation intact distally Intact pulses distally Dorsiflexion/Plantar flexion intact Incision: dressing C/D/I  Assessment/Plan: 1 Day Post-Op Procedure(s) (LRB): RIGHT TOTAL HIP ARTHROPLASTY ANTERIOR APPROACH (Right) Up with therapy  Discharge Monday to home with her daughter vs skilled nursing pending her progress.  Mcarthur Rossetti 07/12/2017, 1:18 PM

## 2017-07-12 NOTE — Plan of Care (Signed)
Plan of care reviewed with patient.

## 2017-07-12 NOTE — Progress Notes (Signed)
Occupational Therapy Evaluation Patient Details Name: [name] MRN: [id number] DOB: January 22, 1921 Today's Date: 07/12/2017    History of Present Illness Pt s/p R THR    Clinical Impression   Patient presents to OT with decreased ADL independence and safety due to the deficits listed below. She will benefit from skilled OT to maximize function and to facilitate a safe discharge. OT will follow.    Follow Up Recommendations  SNF;Supervision/Assistance - 24 hour    Equipment Recommendations  Other (comment)(tbd at next venue of care)    Recommendations for Other Services       Precautions / Restrictions Precautions Precautions: Fall Restrictions Weight Bearing Restrictions: No Other Position/Activity Restrictions: WBAT      Mobility Bed Mobility               General bed mobility comments: NT -- OOB in recliner upon arrival  Transfers Overall transfer level: Needs assistance Equipment used: Rolling walker (2 wheeled) Transfers: Sit to/from Stand Sit to Stand: Min assist;+2 physical assistance;+2 safety/equipment         General transfer comment: cues for LE management and use of UEs to self assist    Balance                                           ADL either performed or assessed with clinical judgement   ADL Overall ADL's : Needs assistance/impaired Eating/Feeding: Set up;Sitting   Grooming: Oral care;Wash/dry face;Set up;Sitting       Lower Body Bathing: Moderate assistance;Maximal assistance;Sit to/from stand   Upper Body Dressing : Minimal assistance;Sitting(don gown as robe)   Lower Body Dressing: Moderate assistance;Maximal assistance;Sit to/from stand   Toilet Transfer: Minimal assistance;+2 for safety/equipment;+2 for physical assistance;Cueing for sequencing;Cueing for safety;Stand-pivot;BSC;RW Toilet Transfer Details (indicate cue type and reason): simulation from recliner; pt reports she is using [facility] with nursing  staff   Toileting - Clothing Manipulation Details (indicate cue type and reason): pt reports nursing staff is assisting with toilet hygiene at this time     Functional mobility during ADLs: Minimal assistance;+2 for physical assistance;+2 for safety/equipment;Cueing for safety;Cueing for sequencing;Rolling walker       Vision         Perception     Praxis      Pertinent Vitals/Pain Pain Assessment: 0-10 Pain Score: 3  Pain Location: R hip Pain Descriptors / Indicators: Aching;Sore Pain Intervention(s): Limited activity within patient's tolerance;Monitored during session;Premedicated before session;Ice applied;Repositioned     Hand Dominance Right   Extremity/Trunk Assessment Upper Extremity Assessment Upper Extremity Assessment: Overall WFL for tasks assessed   Lower Extremity Assessment Lower Extremity Assessment: Defer to PT evaluation       Communication Communication Communication: HOH   Cognition Arousal/Alertness: Awake/alert Behavior During Therapy: WFL for tasks assessed/performed Overall Cognitive Status: Within Functional Limits for tasks assessed                                     General Comments  Ambulation in hall with chair follow; reported some dizziness during ambulation. BP 150/52, O2 98% on 2L via , HR 65-71    Exercises     Shoulder Instructions      Home Living Family/patient expects to be discharged to:: Skilled nursing facility Living Arrangements: Other (Comment)(IND living) Available  Help at Discharge: Family;Available PRN/intermittently Type of Home: Apartment Home Access: Level entry     Home Layout: One level               Home Equipment: Walker - 2 wheels;Walker - 4 wheels;Grab bars - toilet;Grab bars - tub/shower   Additional Comments: Dependent on progress, pt could go home with dtr with 3 stairs and R rail      Prior Functioning/Environment Level of Independence: Independent with assistive  device(s)        Comments: drives and is independent in ADL's, dtr/son help with IADL's        OT Problem List: Decreased activity tolerance;Impaired balance (sitting and/or standing);Decreased safety awareness;Decreased knowledge of use of DME or AE;Pain      OT Treatment/Interventions: Self-care/ADL training;DME and/or AE instruction;Therapeutic activities;Patient/family education    OT Goals(Current goals can be found in the care plan section) Acute Rehab OT Goals Patient Stated Goal: Walk with less pain OT Goal Formulation: With patient Time For Goal Achievement: 07/26/17 Potential to Achieve Goals: Good ADL Goals Pt Will Perform Lower Body Bathing: with min assist;with adaptive equipment;with caregiver independent in assisting;sit to/from stand Pt Will Perform Lower Body Dressing: with min assist;with adaptive equipment;with caregiver independent in assisting;sit to/from stand Pt Will Transfer to Toilet: with min guard assist;ambulating;bedside commode Pt Will Perform Toileting - Clothing Manipulation and hygiene: with min guard assist;sit to/from stand Pt Will Perform Tub/Shower Transfer: with min assist;rolling walker  OT Frequency: Min 2X/week   Barriers to D/C:            Co-evaluation PT/OT/SLP Co-Evaluation/Treatment: Yes Reason for Co-Treatment: For patient/therapist safety PT goals addressed during session: Mobility/safety with mobility OT goals addressed during session: ADL's and self-care      AM-PAC PT "6 Clicks" Daily Activity     Outcome Measure Help from another person eating meals?: None Help from another person taking care of personal grooming?: A Little Help from another person toileting, which includes using toliet, bedpan, or urinal?: A Lot Help from another person bathing (including washing, rinsing, drying)?: A Lot Help from another person to put on and taking off regular upper body clothing?: A Little Help from another person to put on and  taking off regular lower body clothing?: A Lot 6 Click Score: 16   End of Session Equipment Utilized During Treatment: Rolling walker;Gait belt Nurse Communication: Mobility status  Activity Tolerance: Patient tolerated treatment well Patient left: in chair;with call bell/phone within reach;with chair alarm set;with family/visitor present  OT Visit Diagnosis: Unsteadiness on feet (R26.81);Muscle weakness (generalized) (M62.81)                Time: 7482-7078 OT Time Calculation (min): 23 min Charges:  OT General Charges $OT Visit: 1 Visit OT Evaluation $OT Eval Moderate Complexity: 1 Mod G-Codes:       Amber Guthridge A Carlyle Achenbach 13-Jul-2017, 1:37 PM

## 2017-07-13 LAB — CBC
HEMATOCRIT: 26.5 % — AB (ref 36.0–46.0)
HEMOGLOBIN: 8.8 g/dL — AB (ref 12.0–15.0)
MCH: 31.4 pg (ref 26.0–34.0)
MCHC: 33.2 g/dL (ref 30.0–36.0)
MCV: 94.6 fL (ref 78.0–100.0)
Platelets: 165 10*3/uL (ref 150–400)
RBC: 2.8 MIL/uL — AB (ref 3.87–5.11)
RDW: 12.8 % (ref 11.5–15.5)
WBC: 11.7 10*3/uL — ABNORMAL HIGH (ref 4.0–10.5)

## 2017-07-13 NOTE — Clinical Social Work Note (Signed)
Clinical Social Work Assessment  Patient Details  Name: [name] MRN: [id number] Date of Birth: 02-03-1921  Date of referral:  07/13/17               Reason for consult:  Facility Placement                Permission sought to share information with:  Chartered certified accountant granted to share information::  Yes, Verbal Permission Granted  Name::     [name]  Agency::  SNF  Relationship::     Contact Information:     Housing/Transportation Living arrangements for the past 2 months:  [place] of Information:  Patient, Adult Children Patient Interpreter Needed:  None Criminal Activity/Legal Involvement Pertinent to Current Situation/Hospitalization:  No - Comment as needed Significant Relationships:  Adult Children, Other Family Members, Friend Lives with:  Facility Resident Do you feel safe going back to the place where you live?  No Need for family participation in patient care:  Yes (Comment)  Care giving concerns:  Pt resided at [facility] retirement community prior to hospitalization and ambulated independently, drove her own vehicle, and independent with ADL's. Clinical team's recommending SNF as facility does not provide the therapy that patient will need. Given new impairment, patient unsafe to return home at this time.  Social Worker assessment / plan:  CSW met with patient and daughter [name] at bedside to discuss discharge planning. Pt has experience with SNF, so she had minimal questions. CSW obtained permission to send to [facility] as family would prefer closer to home.  CSW sending out offers. CSW completed FL2 however [place] must system is down, will need PASSR prior to dc.  Employment status:  Retired Forensic scientist:  [employer] PT Recommendations:  [place] / Referral to community resources:  [place]  Patient/Family's Response to care:  Probation officer of CSW assistance with SNF placement/options. No issues or concerns identified.  Patient/Family's Understanding of and Emotional Response to Diagnosis, Current Treatment, and Prognosis:  Patient/Family has good understanding of diagnosis, current treatment and prognosis as they are amenable to SNF placement and understand that patient cannot return to independence without therapy.  CSW will continue to assist with disposition.  Emotional Assessment Appearance:  Appears stated age Attitude/Demeanor/Rapport:  (Cooperative and Friendly) Affect (typically observed):  Appropriate, Accepting Orientation:  Oriented to Self, Oriented to Place, Oriented to  Time, Oriented to Situation Alcohol / Substance use:  Not Applicable Psych involvement (Current and /or in the community):  No (Comment)  Discharge Needs  Concerns to be addressed:  Discharge Planning Concerns Readmission within the last 30 days:  No Current discharge risk:  Dependent with Mobility, Physical Impairment Barriers to Discharge:  No Barriers Identified   Normajean Baxter, LCSW 07/13/2017, 11:13 AM

## 2017-07-13 NOTE — Progress Notes (Signed)
Physical Therapy Treatment Patient Details Name: [name] MRN: [id number] DOB: 01/23/1921 Today's Date: 07/13/2017    History of Present Illness Pt s/p R THR     PT Comments    Session limited due to pt c/o dizziness/grogginess. She appeared slow to respond/process at times. Discussed d/c plan with pt and daughter again on today. Plan is for ST rehab at SNF. Will continue to follow and progress activity as able.     Follow Up Recommendations  SNF     Equipment Recommendations  None recommended by PT    Recommendations for Other Services       Precautions / Restrictions Precautions Precautions: Fall Precaution Comments: pt gets dizzy Restrictions Weight Bearing Restrictions: No Other Position/Activity Restrictions: WBAT    Mobility  Bed Mobility Overal bed mobility: Needs Assistance Bed Mobility: Supine to Sit          General bed mobility comments: oob in recliner  Transfers Overall transfer level: Needs assistance Equipment used: Rolling walker (2 wheeled) Transfers: Sit to/from Stand Sit to Stand: Min assist Stand pivot transfers: Min assist       General transfer comment: Assist to rise, stabilize, control descent. Increased time. VCS safety, hand placement  Ambulation/Gait Ambulation/Gait assistance: Min assist Ambulation Distance (Feet): 5 Feet Assistive device: Rolling walker (2 wheeled) Gait Pattern/deviations: Step-to pattern     General Gait Details: 5 feet forwards then backwards. Pt c/o dizziness/grogginess. Deferred further ambulation for safety reasons.    Stairs            Wheelchair Mobility    Modified Rankin (Stroke Patients Only)       Balance                                            Cognition Arousal/Alertness: Awake/alert Behavior During Therapy: WFL for tasks assessed/performed Overall Cognitive Status: Within Functional Limits for tasks assessed                                 General Comments: slow to process/respond at times. Pt c/o "grogginess".      Exercises Total Joint Exercises Ankle Circles/Pumps: AROM;Both;Supine;10 reps Quad Sets: AROM;Both;10 reps;Supine Heel Slides: AAROM;Right;Supine;10 reps Hip ABduction/ADduction: AAROM;Right;Supine;10 reps    General Comments        Pertinent Vitals/Pain Pain Assessment: 0-10 Pain Score: 5  Pain Location: R hip/thigh Pain Descriptors / Indicators: Aching;Grimacing;Sore Pain Intervention(s): Limited activity within patient's tolerance;Repositioned;Ice applied    Home Living                      Prior Function            PT Goals (current goals can now be found in the care plan section) Acute Rehab PT Goals Patient Stated Goal: Walk with less pain Progress towards PT goals: Progressing toward goals    Frequency    7X/week      PT Plan Current plan remains appropriate    Co-evaluation              AM-PAC PT "6 Clicks" Daily Activity  Outcome Measure  Difficulty turning over in bed (including adjusting bedclothes, sheets and blankets)?: Unable Difficulty moving from lying on back to sitting on the side of the bed? : Unable Difficulty sitting down on and  standing up from a chair with arms (e.g., wheelchair, bedside commode, etc,.)?: Unable Help needed moving to and from a bed to chair (including a wheelchair)?: A Little Help needed walking in hospital room?: A Little Help needed climbing 3-5 steps with a railing? : A Little 6 Click Score: 12    End of Session Equipment Utilized During Treatment: Gait belt Activity Tolerance: (limited by dizziness) Patient left: in chair;with chair alarm set;with call bell/phone within reach;with family/visitor present   PT Visit Diagnosis: Difficulty in walking, not elsewhere classified (R26.2);Muscle weakness (generalized) (M62.81)     Time: 1937-9024 PT Time Calculation (min) (ACUTE ONLY): 17 min  Charges:  $Gait Training:  8-22 mins                    G Codes:          Weston Anna, MPT Pager: [phone] '

## 2017-07-13 NOTE — Care Management Note (Signed)
Case Management Note  Patient Details  Name: [name] MRN: [id number] Date of Birth: March 04, 1921  Subjective/Objective:      S/p R THR              Action/Plan: Discharge Planning: NCM spoke to dtr's Arbie Cookey and [place]. Pt lives at home alone but they assist her at home. Waiting PT recommendations. SNF recommended. CSW referral for SNF -rehab. Pt has RW, shower stool and elongated toilet seat at home. Has [facility] in the past for [facility]. Plan is for SNF rehab and return home. Explained to dtrs that if [facility] is need post dc from SNF, they will arrange.   PCP [name] MD  Expected Discharge Date:  07/13/17               Expected Discharge Plan:  Skilled Nursing Facility  In-House Referral:  Clinical Social Work  Discharge planning Services  CM Consult  Post Acute Care Choice:  NA Choice offered to:  NA  DME Arranged:  N/A DME Agency:  NA  HH Arranged:  NA HH Agency:  NA  Status of Service:  Completed, signed off  If discussed at [place] of Stay Meetings, dates discussed:    Additional Comments:  Erenest Rasher, RN 07/13/2017, 11:01 AM

## 2017-07-13 NOTE — Progress Notes (Signed)
Occupational Therapy Treatment Patient Details Name: [name] MRN: [id number] DOB: 12/23/1920 Today's Date: 07/13/2017    History of present illness Pt s/p R THR    OT comments  Patient with increased pain this morning, but agreeable to OT session. Discussed discharge plan with patient; she reports daughter is "untrained" and that short-term rehab might be best option. OT is in agreement as patient still requiring significant assistance with ADLs at this time. No dizziness during this session. OT will continue to follow.  Follow Up Recommendations  SNF;Supervision/Assistance - 24 hour    Equipment Recommendations  Other (comment)(tbd next venue of care)    Recommendations for Other Services      Precautions / Restrictions Precautions Precautions: Fall Restrictions Weight Bearing Restrictions: No Other Position/Activity Restrictions: WBAT       Mobility Bed Mobility Overal bed mobility: Needs Assistance Bed Mobility: Supine to Sit     Supine to sit: Mod assist     General bed mobility comments: cues for sequence and min assist to manage R LE  Transfers Overall transfer level: Needs assistance Equipment used: Rolling walker (2 wheeled) Transfers: Sit to/from [employer] Transfers Sit to Stand: Min assist;Mod assist;From elevated surface Stand pivot transfers: Min assist       General transfer comment: cues for LE management and use of UEs to self assist; Stand pvt BSC to bed    Balance                                           ADL either performed or assessed with clinical judgement   ADL Overall ADL's : Needs assistance/impaired Eating/Feeding: Independent;Sitting                   Lower Body Dressing: Moderate assistance;Maximal assistance;Sit to/from stand   Toilet Transfer: Minimal assistance;Moderate assistance;Stand-pivot Toilet Transfer Details (indicate cue type and reason): simulated bed to recliner          Functional mobility during ADLs: Minimal assistance;Moderate assistance;Rolling walker General ADL Comments: Patient having difficulty trying to eat breakfast due to positioning in bed. Agreeable to get OOB to recliner. Patient moving slowly and reported increased pain as compared to last session. Min/mod A for sit to stand from elevated bed and min A stand pivot to recliner. Discussed with patient discharge plans. Patient reports her daughter is "untrained" and that she may benefit from short term rehab prior to discharge. OT will follow.     Vision       Perception     Praxis      Cognition Arousal/Alertness: Awake/alert Behavior During Therapy: WFL for tasks assessed/performed Overall Cognitive Status: Within Functional Limits for tasks assessed                                          Exercises     Shoulder Instructions       General Comments      Pertinent Vitals/ Pain       Pain Assessment: 0-10 Pain Score: 7  Pain Location: R hip/thigh Pain Descriptors / Indicators: Aching;Sore Pain Intervention(s): Limited activity within patient's tolerance;Monitored during session;Repositioned  Home Living  Prior Functioning/Environment              Frequency  Min 2X/week        Progress Toward Goals  OT Goals(current goals can now be found in the care plan section)  Progress towards OT goals: Progressing toward goals  Acute Rehab OT Goals Patient Stated Goal: Walk with less pain  Plan Discharge plan remains appropriate    Co-evaluation                 AM-PAC PT "6 Clicks" Daily Activity     Outcome Measure   Help from another person eating meals?: None Help from another person taking care of personal grooming?: A Little Help from another person toileting, which includes using toliet, bedpan, or urinal?: A Lot Help from another person bathing (including washing, rinsing,  drying)?: A Lot Help from another person to put on and taking off regular upper body clothing?: A Little Help from another person to put on and taking off regular lower body clothing?: A Lot 6 Click Score: 16    End of Session Equipment Utilized During Treatment: Rolling walker  OT Visit Diagnosis: Unsteadiness on feet (R26.81);Muscle weakness (generalized) (M62.81)   Activity Tolerance Patient tolerated treatment well   Patient Left in chair;with call bell/phone within reach;with chair alarm set   Nurse Communication Mobility status        Time: 8003-4917 OT Time Calculation (min): 15 min  Charges: OT General Charges $OT Visit: 1 Visit OT Treatments $Self Care/Home Management : 8-22 mins     [name] A Estefanny Moler 07/13/2017, 9:58 AM

## 2017-07-13 NOTE — Progress Notes (Signed)
Subjective: 2 Days Post-Op Procedure(s) (LRB): RIGHT TOTAL HIP ARTHROPLASTY ANTERIOR APPROACH (Right) Patient reports pain as mild and moderate.    Objective: Vital signs in last 24 hours: Temp:  [97.6 F (36.4 C)-98.3 F (36.8 C)] 98.3 F (36.8 C) (12/02 0511) Pulse Rate:  [65-91] 91 (12/02 0511) Resp:  [14-18] 14 (12/02 0511) BP: (122-137)/(48-71) 122/48 (12/01 2138) SpO2:  [91 %-99 %] 91 % (12/02 0641)  Intake/Output from previous day: 12/01 0701 - 12/02 0700 In: 1291.3 [P.O.:720; I.V.:571.3] Out: 750 [Urine:750] Intake/Output this shift: No intake/output data recorded.  Recent Labs    07/12/17 0521 07/13/17 0531  HGB 9.1* 8.8*   Recent Labs    07/12/17 0521 07/13/17 0531  WBC 9.5 11.7*  RBC 2.86* 2.80*  HCT 26.8* 26.5*  PLT 154 165   Recent Labs    07/12/17 0521  NA 135  K 4.5  CL 98*  CO2 31  BUN 15  CREATININE 0.70  GLUCOSE 131*  CALCIUM 8.3*   No results for input(s): LABPT, INR in the last 72 hours.  Sensation intact distally Dorsiflexion/Plantar flexion intact Incision: dressing C/D/I  Assessment/Plan: 2 Days Post-Op Procedure(s) (LRB): RIGHT TOTAL HIP ARTHROPLASTY ANTERIOR APPROACH (Right) Plan for discharge tomorrow for either Discharge home with home health or Discharge to SNF  [facility] 07/13/2017, 9:38 AM

## 2017-07-13 NOTE — Progress Notes (Signed)
Physical Therapy Treatment Patient Details Name: RAVENNE WAYMENT MRN: [id number] DOB: 02-28-1921 Today's Date: 07/13/2017    History of Present Illness Pt s/p R THR     PT Comments    Progressing with mobility.   Follow Up Recommendations  SNF     Equipment Recommendations  None recommended by PT    Recommendations for Other Services       Precautions / Restrictions Precautions Precautions: Fall Precaution Comments: pt gets dizzy Restrictions Weight Bearing Restrictions: No Other Position/Activity Restrictions: WBAT    Mobility  Bed Mobility Overal bed mobility: Needs Assistance Bed Mobility: Sit to Supine       Sit to supine: Min assist   General bed mobility comments: Assist for R LE. Increased time.   Transfers Overall transfer level: Needs assistance Equipment used: Rolling walker (2 wheeled) Transfers: Sit to/from Stand Sit to Stand: Min assist         General transfer comment: Assist to rise, stabilize, control descent. Increased time. VCS safety, hand placement  Ambulation/Gait Ambulation/Gait assistance: Min assist Ambulation Distance (Feet): 75 Feet Assistive device: Rolling walker (2 wheeled) Gait Pattern/deviations: Step-to pattern     General Gait Details: small amount of assist to stabilize pt intermittently. Pt fatigues fairly easily.   Stairs            Wheelchair Mobility    Modified Rankin (Stroke Patients Only)       Balance                                            Cognition Arousal/Alertness: Awake/alert Behavior During Therapy: WFL for tasks assessed/performed Overall Cognitive Status: Within Functional Limits for tasks assessed                                        Exercises      General Comments        Pertinent Vitals/Pain Pain Assessment: 0-10 Pain Score: 5  Pain Location: R hip/thigh Pain Descriptors / Indicators: Sore Pain Intervention(s): Monitored during  session;Ice applied;Repositioned    Home Living                      Prior Function            PT Goals (current goals can now be found in the care plan section) Progress towards PT goals: Progressing toward goals    Frequency    7X/week      PT Plan Current plan remains appropriate    Co-evaluation              AM-PAC PT "6 Clicks" Daily Activity  Outcome Measure  Difficulty turning over in bed (including adjusting bedclothes, sheets and blankets)?: Unable Difficulty moving from lying on back to sitting on the side of the bed? : Unable Difficulty sitting down on and standing up from a chair with arms (e.g., wheelchair, bedside commode, etc,.)?: Unable Help needed moving to and from a bed to chair (including a wheelchair)?: A Little Help needed walking in hospital room?: A Little Help needed climbing 3-5 steps with a railing? : A Little 6 Click Score: 12    End of Session Equipment Utilized During Treatment: Gait belt Activity Tolerance: Patient tolerated treatment well Patient left: in bed;with bed  alarm set;with call bell/phone within reach;with family/visitor present   PT Visit Diagnosis: Muscle weakness (generalized) (M62.81);Difficulty in walking, not elsewhere classified (R26.2)     Time: 3582-5189 PT Time Calculation (min) (ACUTE ONLY): 19 min  Charges:  $Gait Training: 8-22 mins                    G Codes:          Weston Anna, MPT Pager: [phone]

## 2017-07-13 NOTE — Plan of Care (Signed)
Progressing with plan of care.  Awaiting disposition decision.

## 2017-07-13 NOTE — NC FL2 (Signed)
[place] MEDICAID FL2 LEVEL OF CARE SCREENING TOOL     IDENTIFICATION  Patient Name: [name] Birthdate: 07-12-1921 Sex: female Admission Date (Current Location): 07/11/2017  [facility] and [place] Number:  Herbalist and Address:  [facility],  [place], [place]      Provider Number: [id number]  Attending Physician Name and Address:  Mcarthur Rossetti  Relative Name and Phone Number:  Lilyan Gilford, daughter, [phone]    Current Level of Care: Hospital Recommended Level of Care: [place] Prior Approval Number:    Date Approved/Denied:   PASRR Number: pending  Discharge Plan: SNF    Current Diagnoses: Patient Active Problem List   Diagnosis Date Noted  . Status post total replacement of right hip 07/11/2017  . Pain of right hip joint 05/05/2017  . DOE (dyspnea on exertion) 02/21/2017  . Asthma, chronic, moderate persistent, uncomplicated 91/63/8466  . Chronic respiratory failure with hypoxia and hypercapnia ([place]) 02/21/2017  . Hypertensive urgency 01/30/2017  . Dizziness 01/30/2017  . Hyponatremia with extracellular fluid depletion 01/30/2017  . Hyponatremia 01/30/2017  . Lumbar spondylosis 01/28/2017  . Unilateral primary osteoarthritis, right hip 01/28/2017  . Arthritis   . Asthma   . Cataract   . Colon cancer ([place])   . COPD (chronic obstructive pulmonary disease) ([place])   . Diabetes mellitus without complication ([place])   . Diverticulitis   . DVT (deep vein thrombosis) in pregnancy ([place])   . Essential hypertension   . GERD (gastroesophageal reflux disease)   . Hearing impaired   . Hyperlipidemia   . Hypertension   . TIA (transient ischemic attack)   . Seasonal allergies     Orientation RESPIRATION BLADDER Height & Weight     Self, Time, Situation, Place  O2(Nasal Cannula 3L) Continent Weight: 132 lb (59.9 kg) Height:  5\' 4"  (162.6 cm)  BEHAVIORAL SYMPTOMS/MOOD NEUROLOGICAL BOWEL  NUTRITION STATUS      Continent Diet(See DC Summary)  AMBULATORY STATUS COMMUNICATION OF NEEDS Skin   Limited Assist Verbally Surgical wounds                       Personal Care Assistance Level of Assistance  Bathing, Dressing, Feeding Bathing Assistance: Maximum assistance Feeding assistance: Limited assistance Dressing Assistance: Maximum assistance     Functional Limitations Info  Hearing, Speech, Sight Sight Info: Adequate Hearing Info: Adequate Speech Info: Adequate    SPECIAL CARE FACTORS FREQUENCY  PT (By licensed PT), OT (By licensed OT)     PT Frequency: 5x week OT Frequency: 5x week            Contractures Contractures Info: Not present    Additional Factors Info  Code Status, Allergies Code Status Info: Full Allergies Info: PROCHLORPERAZINE, CELECOXIB, CODEINE, FISH OIL, FOSAMAX ALENDRONATE SODIUM, MORPHINE AND RELATED, MOXIFLOXACIN, OXYCODONE, PREGABALIN, PROPOXYPHENE, STATINS, ASPIRIN-DIPYRIDAMOLE ER, BUPRENORPHINE HCL            Current Medications (07/13/2017):  This is the current hospital active medication list Current Facility-Administered Medications  Medication Dose Route Frequency Provider Last Rate Last Dose  . 0.9 %  sodium chloride infusion   Intravenous Continuous Mcarthur Rossetti, MD   Stopped at 07/12/17 1337  . acetaminophen (TYLENOL) tablet 650 mg  650 mg Oral Q4H PRN Mcarthur Rossetti, MD       Or  . acetaminophen (TYLENOL) suppository 650 mg  650 mg Rectal Q4H PRN Mcarthur Rossetti, MD      .  albuterol (PROVENTIL) (2.5 MG/3ML) 0.083% nebulizer solution 3 mL  3 mL Inhalation Q4H PRN Mcarthur Rossetti, MD      . alum & mag hydroxide-simeth (MAALOX/MYLANTA) 200-200-20 MG/5ML suspension 30 mL  30 mL Oral Q4H PRN Mcarthur Rossetti, MD      . aspirin chewable tablet 81 mg  81 mg Oral Daily Mcarthur Rossetti, MD   81 mg at 07/13/17 1015  . bisoprolol (ZEBETA) tablet 5 mg  5 mg Oral Daily Mcarthur Rossetti, MD   5 mg at 07/13/17 1011  . calcium-vitamin D (OSCAL WITH D) 500-200 MG-UNIT per tablet 2 tablet  2 tablet Oral Q breakfast Mcarthur Rossetti, MD   2 tablet at 07/13/17 [phone]  . clopidogrel (PLAVIX) tablet 75 mg  75 mg Oral Daily Mcarthur Rossetti, MD   75 mg at 07/13/17 1011  . dimenhyDRINATE (DRAMAMINE) tablet 50 mg  50 mg Oral Q8H PRN Mcarthur Rossetti, MD      . diphenhydrAMINE (BENADRYL) 12.5 MG/5ML elixir 12.5-25 mg  12.5-25 mg Oral Q4H PRN Mcarthur Rossetti, MD      . docusate sodium (COLACE) capsule 100 mg  100 mg Oral BID Mcarthur Rossetti, MD   100 mg at 07/13/17 1015  . ferrous sulfate tablet 325 mg  325 mg Oral Q M,W,F Mcarthur Rossetti, MD   325 mg at 07/11/17 1550  . fluticasone (FLONASE) 50 MCG/ACT nasal spray 1 spray  1 spray Each Nare BID PRN Mcarthur Rossetti, MD   1 spray at 07/12/17 1027  . fluticasone furoate-vilanterol (BREO ELLIPTA) 100-25 MCG/INH 1 puff  1 puff Inhalation Daily Mcarthur Rossetti, MD   1 puff at 07/13/17 [phone]  . gabapentin (NEURONTIN) capsule 100 mg  100 mg Oral QPC lunch Mcarthur Rossetti, MD   100 mg at 07/12/17 1250  . HYDROcodone-acetaminophen (NORCO/VICODIN) 5-325 MG per tablet 1 tablet  1 tablet Oral Q4H PRN Mcarthur Rossetti, MD   1 tablet at 07/13/17 1057  . HYDROmorphone (DILAUDID) injection 0.5 mg  0.5 mg Intravenous Q2H PRN Mcarthur Rossetti, MD   0.5 mg at 07/11/17 1217  . ipratropium (ATROVENT) 0.03 % nasal spray 1 spray  1 spray Each Nare QHS Mcarthur Rossetti, MD      . loratadine (CLARITIN) tablet 10 mg  10 mg Oral Daily Mcarthur Rossetti, MD   10 mg at 07/13/17 1011  . losartan (COZAAR) tablet 50 mg  50 mg Oral Daily Mcarthur Rossetti, MD   50 mg at 07/13/17 1010  . menthol-cetylpyridinium (CEPACOL) lozenge 3 mg  1 lozenge Oral PRN Mcarthur Rossetti, MD       Or  . phenol (CHLORASEPTIC) mouth spray 1 spray  1 spray Mouth/Throat PRN  Mcarthur Rossetti, MD      . methocarbamol (ROBAXIN) tablet 500 mg  500 mg Oral Q6H PRN Mcarthur Rossetti, MD   500 mg at 07/13/17 0209   Or  . methocarbamol (ROBAXIN) 500 mg in dextrose 5 % 50 mL IVPB  500 mg Intravenous Q6H PRN Mcarthur Rossetti, MD   Stopped at 07/11/17 1119  . metoCLOPramide (REGLAN) tablet 5-10 mg  5-10 mg Oral Q8H PRN Mcarthur Rossetti, MD       Or  . metoCLOPramide (REGLAN) injection 5-10 mg  5-10 mg Intravenous Q8H PRN Mcarthur Rossetti, MD      . ondansetron [facility]) tablet 4 mg  4 mg Oral Q6H PRN Ninfa Linden,  Lind Guest, MD       Or  . ondansetron [facility]) injection 4 mg  4 mg Intravenous Q6H PRN Mcarthur Rossetti, MD      . pantoprazole (PROTONIX) EC tablet 40 mg  40 mg Oral Daily Mcarthur Rossetti, MD   40 mg at 07/13/17 1016  . polyethylene glycol (MIRALAX / GLYCOLAX) packet 17 g  17 g Oral Daily PRN Mcarthur Rossetti, MD      . polyvinyl alcohol (LIQUIFILM TEARS) 1.4 % ophthalmic solution 1 drop  1 drop Both Eyes PRN Mcarthur Rossetti, MD   1 drop at 07/12/17 1028  . primidone (MYSOLINE) tablet 25 mg  25 mg Oral BID Mcarthur Rossetti, MD   25 mg at 07/13/17 1013     Discharge Medications: Please see discharge summary for a list of discharge medications.  Relevant Imaging Results:  Relevant Lab Results:   Additional Information SS: [id number] [place], LCSW

## 2017-07-14 ENCOUNTER — Encounter (HOSPITAL_COMMUNITY): Payer: Self-pay | Admitting: Orthopaedic Surgery

## 2017-07-14 LAB — CBC
HCT: 24.4 % — ABNORMAL LOW (ref 36.0–46.0)
HEMOGLOBIN: 8.3 g/dL — AB (ref 12.0–15.0)
MCH: 31.8 pg (ref 26.0–34.0)
MCHC: 34 g/dL (ref 30.0–36.0)
MCV: 93.5 fL (ref 78.0–100.0)
PLATELETS: 147 10*3/uL — AB (ref 150–400)
RBC: 2.61 MIL/uL — AB (ref 3.87–5.11)
RDW: 13 % (ref 11.5–15.5)
WBC: 9.9 10*3/uL (ref 4.0–10.5)

## 2017-07-14 MED ORDER — HYDROCODONE-ACETAMINOPHEN 5-325 MG PO TABS: 1.0000 | ORAL_TABLET | ORAL | 0 refills | Status: DC | PRN

## 2017-07-14 MED ORDER — METHOCARBAMOL 500 MG PO TABS: 500.0000 mg | ORAL_TABLET | Freq: Four times a day (QID) | ORAL | 0 refills | Status: DC | PRN

## 2017-07-14 NOTE — Progress Notes (Signed)
Physical Therapy Treatment Patient Details Name: [name] MRN: [id number] DOB: 1920/10/16 Today's Date: 07/14/2017    History of Present Illness Pt s/p R THR     PT Comments    Progressing with mobility. Plan is for d/c to SNF later today.    Follow Up Recommendations  SNF     Equipment Recommendations  None recommended by PT    Recommendations for Other Services OT consult     Precautions / Restrictions Precautions Precautions: Fall Precaution Comments: pt gets dizzy Restrictions Weight Bearing Restrictions: No Other Position/Activity Restrictions: WBAT    Mobility  Bed Mobility               General bed mobility comments: oob in recliner  Transfers Overall transfer level: Needs assistance Equipment used: Rolling walker (2 wheeled) Transfers: Sit to/from Stand Sit to Stand: Min assist         General transfer comment: Assist to rise, stabilize, control descent. Increased time. VCS safety, hand placement  Ambulation/Gait Ambulation/Gait assistance: Min assist Ambulation Distance (Feet): 100 Feet Assistive device: Rolling walker (2 wheeled) Gait Pattern/deviations: Step-to pattern     General Gait Details: small amount of assist to stabilize pt intermittently. Pt fatigues fairly easily.   Stairs            Wheelchair Mobility    Modified Rankin (Stroke Patients Only)       Balance                                            Cognition Arousal/Alertness: Awake/alert Behavior During Therapy: WFL for tasks assessed/performed Overall Cognitive Status: Within Functional Limits for tasks assessed                                        Exercises      General Comments        Pertinent Vitals/Pain Pain Assessment: 0-10 Pain Score: 5  Pain Location: R hip/thigh Pain Descriptors / Indicators: Sore Pain Intervention(s): Monitored during session;Ice applied    Home Living                       Prior Function            PT Goals (current goals can now be found in the care plan section) Progress towards PT goals: Progressing toward goals    Frequency    7X/week      PT Plan Current plan remains appropriate    Co-evaluation              AM-PAC PT "6 Clicks" Daily Activity  Outcome Measure  Difficulty turning over in bed (including adjusting bedclothes, sheets and blankets)?: Unable Difficulty moving from lying on back to sitting on the side of the bed? : Unable Difficulty sitting down on and standing up from a chair with arms (e.g., wheelchair, bedside commode, etc,.)?: Unable Help needed moving to and from a bed to chair (including a wheelchair)?: A Little Help needed walking in hospital room?: A Little Help needed climbing 3-5 steps with a railing? : A Little 6 Click Score: 12    End of Session Equipment Utilized During Treatment: Gait belt Activity Tolerance: Patient tolerated treatment well Patient left: in chair;with call bell/phone within reach  PT Visit Diagnosis: Muscle weakness (generalized) (M62.81);Difficulty in walking, not elsewhere classified (R26.2)     Time: 8022-3361 PT Time Calculation (min) (ACUTE ONLY): 21 min  Charges:  $Gait Training: 8-22 mins                    G Codes:          Weston Anna, MPT Pager: [phone]

## 2017-07-14 NOTE — Discharge Summary (Signed)
Patient ID: [name] MRN: [id number] DOB/AGE: [birth year] 83 y.o.  Admit date: 07/11/2017 Discharge date: 07/14/2017  Admission Diagnoses:  Principal Problem:   Status post total replacement of right hip   Discharge Diagnoses:  Same  Past Medical History:  Diagnosis Date  . Anemia    iron deficiency  . Arthritis   . Asthma   . Cataract   . Chronic respiratory failure with hypoxia ([place])   . Colitis   . Colon cancer ([place])    basal cell nose  . Diabetes mellitus without complication ([place])    NO meds ever [place] related to steroid use. Has more problems eith hypoglycemia  . Diverticulitis   . DVT (deep venous thrombosis) ([place])   . Essential hypertension   . Family history of adverse reaction to anesthesia    both daughters have problems getting bp up after anesthesia  . GERD (gastroesophageal reflux disease)   . Hearing impaired   . Hiatal hernia    neuropathy  . History of kidney stones   . Hyperlipidemia   . Hypertension   . Pneumonia   . PONV (postoperative nausea and vomiting)    BP drops really low cant breathe well vomiting  . Seasonal allergies   . TIA (transient ischemic attack)    x2 2 years ago    Surgeries: Procedure(s): RIGHT TOTAL HIP ARTHROPLASTY ANTERIOR APPROACH on 07/11/2017   Consultants:   Discharged Condition: Improved  Hospital Course: [name] is an 83 y.o. female who was admitted 07/11/2017 for operative treatment ofStatus post total replacement of right hip. Patient has severe unremitting pain that affects sleep, daily activities, and work/hobbies. After pre-op clearance the patient was taken to the operating room on 07/11/2017 and underwent  Procedure(s): RIGHT TOTAL HIP ARTHROPLASTY ANTERIOR APPROACH.    Patient was given perioperative antibiotics:  Anti-infectives (From admission, onward)   Start     Dose/Rate Route Frequency Ordered Stop   07/11/17 1500  ceFAZolin (ANCEF) IVPB 1 g/50 mL premix     1 g 100 mL/hr over 30  Minutes Intravenous Every 6 hours 07/11/17 1152 07/11/17 2130   07/11/17 0721  ceFAZolin (ANCEF) 2-4 GM/100ML-% IVPB    Comments:  Mardelle Matte   : cabinet override      07/11/17 0721 07/11/17 0850   07/11/17 0658  ceFAZolin (ANCEF) IVPB 2g/100 mL premix     2 g 200 mL/hr over 30 Minutes Intravenous On call to O.R. 07/11/17 5784 07/11/17 0855       Patient was given sequential compression devices, early ambulation, and chemoprophylaxis to prevent DVT.  Patient benefited maximally from hospital stay and there were no complications.    Recent vital signs:  Patient Vitals for the past 24 hrs:  BP Temp Temp src Pulse Resp SpO2  07/14/17 0616 (!) 120/44 98.1 F (36.7 C) Oral 84 15 91 %  07/13/17 2158 (!) 136/49 98.4 F (36.9 C) Oral 97 20 94 %  07/13/17 1441 (!) 104/38 (!) 97.3 F (36.3 C) Oral 62 14 -  07/13/17 1020 - - - - - 97 %     Recent laboratory studies:  Recent Labs    07/12/17 0521 07/13/17 0531 07/14/17 0538  WBC 9.5 11.7* 9.9  HGB 9.1* 8.8* 8.3*  HCT 26.8* 26.5* 24.4*  PLT 154 165 147*  NA 135  --   --   K 4.5  --   --   CL 98*  --   --   CO2 31  --   --  BUN 15  --   --   CREATININE 0.70  --   --   GLUCOSE 131*  --   --   CALCIUM 8.3*  --   --      Discharge Medications:   Allergies as of 07/14/2017      Reactions   Prochlorperazine Other (See Comments)   tremor   Celecoxib Nausea And Vomiting, Other (See Comments)   Body aches   Codeine Nausea And Vomiting   Tolerates hydrocodone   Fish Oil Nausea And Vomiting   Fosamax [alendronate Sodium] Nausea And Vomiting   Morphine And Related Nausea And Vomiting   Moxifloxacin Other (See Comments)   fatigue   Oxycodone Nausea And Vomiting   Tolerates hydrocodone   Pregabalin Swelling   Propoxyphene Nausea And Vomiting   Statins Other (See Comments)   Muscle pain   Aspirin-dipyridamole Er Other (See Comments)   Headcache   Buprenorphine Hcl Nausea And Vomiting      Medication List    TAKE  these medications   acetaminophen 500 MG tablet Commonly known as:  TYLENOL Take 500 mg by mouth daily.   albuterol 108 (90 Base) MCG/ACT inhaler Commonly known as:  PROVENTIL HFA;VENTOLIN HFA Inhale 1 puff into the lungs every 4 (four) hours as needed for wheezing or shortness of breath.   aspirin EC 81 MG tablet Take 81 mg 2 (two) times a week by mouth. Take 1 tablet by mouth every Sunday and Thursday only.   bisoprolol 5 MG tablet Commonly known as:  ZEBETA Take 1 tablet (5 mg total) by mouth daily.   CALCIUM 600+D PLUS MINERALS 600-400 MG-UNIT Chew Chew 2 tablets every morning by mouth.   clopidogrel 75 MG tablet Commonly known as:  PLAVIX Take 1 tablet (75 mg total) by mouth daily.   dexlansoprazole 60 MG capsule Commonly known as:  DEXILANT TAKE 1CAP BY MOUTH EVERY MORNING BEFORE BREAKFAST.   dimenhyDRINATE 50 MG tablet Commonly known as:  DRAMAMINE Take 50 mg every 8 (eight) hours as needed by mouth (for dizziness/vertigo).   feeding supplement (ENSURE ENLIVE) Liqd Take 237 mLs by mouth 2 (two) times daily between meals.   ferrous sulfate 325 (65 FE) MG tablet Take 325 mg every Monday, Wednesday, and Friday by mouth. In the morning with breakfast.   fluticasone 50 MCG/ACT nasal spray Commonly known as:  FLONASE Place 1 spray 2 (two) times daily as needed into both nostrils (for allergies.).   fluticasone furoate-vilanterol 100-25 MCG/INH Aepb Commonly known as:  BREO ELLIPTA Inhale 1 puff into the lungs daily.   gabapentin 100 MG capsule Commonly known as:  NEURONTIN Take 100 mg daily after lunch by mouth.   HYDROcodone-acetaminophen 5-325 MG tablet Commonly known as:  NORCO/VICODIN Take 1-2 tablets by mouth every 4 (four) hours as needed for moderate pain ((score 4 to 6)). What changed:    how much to take  how to take this  when to take this  reasons to take this  additional instructions   ipratropium 0.03 % nasal spray Commonly known as:   ATROVENT Place 1 spray into both nostrils at bedtime.   loratadine 10 MG tablet Commonly known as:  CLARITIN Take 10 mg by mouth daily.   losartan 50 MG tablet Commonly known as:  COZAAR Take 1 tablet (50 mg total) by mouth daily.   LUBRICANT EYE DROPS 0.4-0.3 % Soln Generic drug:  Polyethyl Glycol-Propyl Glycol Apply 1 drop to eye 4 (four) times daily.  METAMUCIL FIBER PO Take 1 scoop by mouth daily.   methocarbamol 500 MG tablet Commonly known as:  ROBAXIN Take 1 tablet (500 mg total) by mouth every 6 (six) hours as needed for muscle spasms.   NASAL SPRAY SALINE NA Place 1 spray 4 (four) times daily as needed into both nostrils (congestion).   ondansetron 4 MG tablet Commonly known as:  ZOFRAN Take 2 mg 2 (two) times daily by mouth. In the morning & with hydrocodone at bedtime.   polyethylene glycol powder powder Commonly known as:  GLYCOLAX/MIRALAX Take 17 g at bedtime by mouth.   primidone 50 MG tablet Commonly known as:  MYSOLINE TAKE 1/2 TABLET BY MOUTH TWICE A DAY What changed:    how much to take  how to take this  when to take this   PROBIOTIC PO Take 1 capsule daily with lunch by mouth.   Vitamin D3 10000 units Tabs Take 10,000 Units 4 (four) times a week by mouth. Monday, Tuesday, Wednesday, & Thursday            Durable Medical Equipment  (From admission, onward)        Start     Ordered   07/11/17 1153  DME 3 n 1  Once     07/11/17 1152   07/11/17 1153  DME Walker rolling  Once    Question:  Patient needs a walker to treat with the following condition  Answer:  Status post total replacement of right hip   07/11/17 1152      Diagnostic Studies: Dg Pelvis Portable  Result Date: 07/11/2017 CLINICAL DATA:  Total hip replacement EXAM: PORTABLE PELVIS 1-2 VIEWS COMPARISON:  01/30/2017 FINDINGS: Changes of right hip replacement. Normal AP alignment. No hardware or bony complicating feature noted. IMPRESSION: Right hip replacement.  No  visible complicating feature. Electronically Signed   By: Rolm Baptise M.D.   On: 07/11/2017 10:57   Dg C-arm 1-60 Min-no Report  Result Date: 07/11/2017 Fluoroscopy was utilized by the requesting physician.  No radiographic interpretation.   Dg Hip Operative Unilat W Or W/o Pelvis Right  Result Date: 07/11/2017 CLINICAL DATA:  Right hip replacement EXAM: OPERATIVE RIGHT HIP (WITH PELVIS IF PERFORMED) 2 VIEWS TECHNIQUE: Fluoroscopic spot image(s) were submitted for interpretation post-operatively. COMPARISON:  01/28/2017 FINDINGS: Changes of right hip replacement. No hardware bony complicating feature. Normal AP alignment. IMPRESSION: Right hip replacement.  No complicating feature. Electronically Signed   By: Rolm Baptise M.D.   On: 07/11/2017 10:23    Disposition: to skilled nursing facility  Discharge Instructions    Discharge patient   Complete by:  As directed    Discharge disposition:  03-Skilled [facility]   Discharge patient date:  07/14/2017      Follow-up Information    Mcarthur Rossetti, MD Follow up in 2 week(s).   Specialty:  Orthopedic Surgery Contact information: [place] [address] [phone]            Signed: Mcarthur Rossetti 07/14/2017, 7:31 AM

## 2017-07-14 NOTE — Care Management Important Message (Signed)
Important Message  Patient Details  Name: [name] MRN: [id number] Date of Birth: 17-Aug-1920   Medicare Important Message Given:  Yes    Kerin Salen 07/14/2017, 10:43 AMImportant Message  Patient Details  Name: [name] MRN: [id number] Date of Birth: 1921/05/02   Medicare Important Message Given:  Yes    Kerin Salen 07/14/2017, 10:43 AM

## 2017-07-14 NOTE — Clinical Social Work Placement (Signed)
D/C Summary sent. PTAR to transport, Nurse given number for report.   CLINICAL SOCIAL WORK PLACEMENT  NOTE  Date:  07/14/2017  Patient Details  Name: [name] MRN: [id number] Date of Birth: 13-Jun-1921  Clinical Social Work is seeking post-discharge placement for this patient at the [place] level of care (*CSW will initial, date and re-position this form in  chart as items are completed):  Yes   Patient/family provided with [place] Work Department's list of facilities offering this level of care within the geographic area requested by the patient (or if unable, by the patient's family).  Yes   Patient/family informed of their freedom to choose among providers that offer the needed level of care, that participate in Medicare, Medicaid or managed care program needed by the patient, have an available bed and are willing to accept the patient.  Yes   Patient/family informed of [facility]'s ownership interest in [facility] and [facility], as well as of the fact that they are under no obligation to receive care at these facilities.  PASRR submitted to EDS on       PASRR number received on       Existing PASRR number confirmed on 07/14/17     FL2 transmitted to all facilities in geographic area requested by pt/family on       FL2 transmitted to all facilities within larger geographic area on       Patient informed that his/her managed care company has contracts with or will negotiate with certain facilities, including the following:  [facility]     Yes   Patient/family informed of bed offers received.  Patient chooses bed at [facility]     Physician recommends and patient chooses bed at [facility]    Patient to be transferred to [facility] on 07/14/17.  Patient to be transferred to facility by PTAR     Patient family notified on 07/14/17 of transfer.  Name of  family member notified:  Daughter at bedside     PHYSICIAN       Additional Comment:    _______________________________________________ Lia Hopping, LCSW 07/14/2017, 12:38 PM

## 2017-07-14 NOTE — Progress Notes (Signed)
Patient ID: [name], female   DOB: 12-28-1920, 83 y.o.   MRN: [id number] Stable overall.  She understands the recommendation for short-term skilled nursing.  Can be discharged today.

## 2017-07-14 NOTE — Anesthesia Postprocedure Evaluation (Signed)
Anesthesia Post Note  Patient: [name]  Procedure(s) Performed: RIGHT TOTAL HIP ARTHROPLASTY ANTERIOR APPROACH (Right Hip)     Patient location during evaluation: PACU Anesthesia Type: Spinal Level of consciousness: oriented and awake and alert Pain management: pain level controlled Vital Signs Assessment: post-procedure vital signs reviewed and stable Respiratory status: spontaneous breathing, respiratory function stable and patient connected to nasal cannula oxygen Cardiovascular status: blood pressure returned to baseline and stable Postop Assessment: no headache, no backache and no apparent nausea or vomiting Anesthetic complications: no    Last Vitals:  Vitals:   07/14/17 0616 07/14/17 1336  BP: (!) 120/44 (!) 108/40  Pulse: 84 77  Resp: 15 16  Temp: 36.7 C 36.7 C  SpO2: 91% 98%    Last Pain:  Vitals:   07/14/17 1336  TempSrc: Oral  PainSc:                  Kamree Wiens P Verbie Babic

## 2017-07-14 NOTE — Progress Notes (Signed)
Called report to [facility] and spoke with [place] B,RN. Awaiting PTAR.

## 2017-07-24 ENCOUNTER — Encounter (INDEPENDENT_AMBULATORY_CARE_PROVIDER_SITE_OTHER): Payer: Self-pay | Admitting: Orthopaedic Surgery

## 2017-07-24 ENCOUNTER — Ambulatory Visit (INDEPENDENT_AMBULATORY_CARE_PROVIDER_SITE_OTHER): Payer: Medicare Other | Admitting: Orthopaedic Surgery

## 2017-07-24 DIAGNOSIS — Z96641 Presence of right artificial hip joint: Secondary | ICD-10-CM

## 2017-07-24 NOTE — Progress Notes (Signed)
The patient will be 2 weeks tomorrow status post a right total hip arthroplasty.  She is 83 years old but very active individual and she does drive.  She staying in a nursing facility but is due to be discharged from there next week.  She says she is doing well.  I had her stand up on her own she can stand up easily.  Her right hip incision looks great and so remove the Steri-Strips in place new ones.  There is no significant seroma at all.  Her leg lengths are equal.  At this point tolerating easily put in her right hip to internal and external rotation.  She will transition home health therapy next week at her daughter's home.  I will see her back in a month to see how she is doing overall but no x-rays are needed.  All questions and concerns were answered and addressed.

## 2017-07-29 ENCOUNTER — Ambulatory Visit (INDEPENDENT_AMBULATORY_CARE_PROVIDER_SITE_OTHER): Payer: Medicare Other | Admitting: Adult Health

## 2017-07-29 ENCOUNTER — Encounter: Payer: Self-pay | Admitting: Adult Health

## 2017-07-29 VITALS — BP 120/56 | Temp 98.4°F | Wt 137.0 lb

## 2017-07-29 DIAGNOSIS — R600 Localized edema: Secondary | ICD-10-CM | POA: Diagnosis not present

## 2017-07-29 NOTE — Progress Notes (Signed)
Subjective:    Patient ID: [name], female    DOB: 07-07-1921, 83 y.o.   MRN: [id number]  HPI  83 year old female who  has a past medical history of Anemia, Arthritis, Asthma, Cataract, Chronic respiratory failure with hypoxia ([place]), Colitis, Colon cancer ([place]), Diabetes mellitus without complication ([place]), Diverticulitis, DVT (deep venous thrombosis) ([place]), Essential hypertension, Family history of adverse reaction to anesthesia, GERD (gastroesophageal reflux disease), Hearing impaired, Hiatal hernia, History of kidney stones, Hyperlipidemia, Hypertension, Pneumonia, PONV (postoperative nausea and vomiting), Seasonal allergies, and TIA (transient ischemic attack).  She presents to the office today with an acute complaint of lower extremity edema. She recently had total right hip surgery and is doing well post op. She did rehab at [facility]. At that facility her gabapentin was increased from daily to TID. She reports while in rehab both of her legs started to swell. She had a US done at the facility which was negative for DVT ( I do not have this report).   She endorses pain with palpation. She has been elevating her legs and wearing compression socks which do not help.   Denies any redness or warmth    Review of Systems   See HPI  Past Medical History:  Diagnosis Date  . Anemia    iron deficiency  . Arthritis   . Asthma   . Cataract   . Chronic respiratory failure with hypoxia ([place])   . Colitis   . Colon cancer ([place])    basal cell nose  . Diabetes mellitus without complication ([place])    NO meds ever [place] related to steroid use. Has more problems eith hypoglycemia  . Diverticulitis   . DVT (deep venous thrombosis) ([place])   . Essential hypertension   . Family history of adverse reaction to anesthesia    both daughters have problems getting bp up after anesthesia  . GERD (gastroesophageal reflux disease)   . Hearing impaired   . Hiatal hernia    neuropathy  . History of kidney stones   . Hyperlipidemia   . Hypertension   . Pneumonia   . PONV (postoperative nausea and vomiting)    BP drops really low cant breathe well vomiting  . Seasonal allergies   . TIA (transient ischemic attack)    x2 2 years ago    Social History   Socioeconomic History  . Marital status: Widowed    Spouse name: Not on file  . Number of children: Not on file  . Years of education: Not on file  . Highest education level: Not on file  Social Needs  . Financial resource strain: Not on file  . Food insecurity - worry: Not on file  . Food insecurity - inability: Not on file  . Transportation needs - medical: Not on file  . Transportation needs - non-medical: Not on file  Occupational History  . Not on file  Tobacco Use  . Smoking status: Never Smoker  . Smokeless tobacco: Never Used  Substance and Sexual Activity  . Alcohol use: No  . Drug use: No  . Sexual activity: Not Currently  Other Topics Concern  . Not on file  Social History Narrative  . Not on file    Past Surgical History:  Procedure Laterality Date  . APPENDECTOMY    . CHOLECYSTECTOMY    . COLON RESECTION  1967  . EYE SURGERY     cataract  . JOINT REPLACEMENT  Right hip Dr. Ninfa Linden 07/11/17  . KIDNEY SURGERY  1966   cyst  . TONSILLECTOMY    . TOTAL ABDOMINAL HYSTERECTOMY    . TOTAL HIP ARTHROPLASTY Right 07/11/2017   Procedure: RIGHT TOTAL HIP ARTHROPLASTY ANTERIOR APPROACH;  Surgeon: Mcarthur Rossetti, MD;  Location: [facility];  Service: Orthopedics;  Laterality: Right;    Family History  Problem Relation Age of Onset  . Breast cancer Mother   . Heart attack Mother   . Emphysema Sister        smoked  . Prostate cancer Brother   . Heart disease Brother     Allergies  Allergen Reactions  . Prochlorperazine Other (See Comments)    tremor  . Celecoxib Nausea And Vomiting and Other (See Comments)    Body aches  . Codeine Nausea And Vomiting    Tolerates  hydrocodone  . Fish Oil Nausea And Vomiting  . Fosamax [Alendronate Sodium] Nausea And Vomiting  . Morphine And Related Nausea And Vomiting  . Moxifloxacin Other (See Comments)    fatigue  . Oxycodone Nausea And Vomiting    Tolerates hydrocodone  . Pregabalin Swelling  . Propoxyphene Nausea And Vomiting  . Statins Other (See Comments)    Muscle pain  . Aspirin-Dipyridamole Er Other (See Comments)    Headcache  . Buprenorphine Hcl Nausea And Vomiting    Current Outpatient Medications on File Prior to Visit  Medication Sig Dispense Refill  . acetaminophen (TYLENOL) 500 MG tablet Take 500 mg by mouth daily.    Marland Kitchen albuterol (PROVENTIL HFA;VENTOLIN HFA) 108 (90 BASE) MCG/ACT inhaler Inhale 1 puff into the lungs every 4 (four) hours as needed for wheezing or shortness of breath.     Marland Kitchen aspirin EC 81 MG tablet Take 81 mg 2 (two) times a week by mouth. Take 1 tablet by mouth every Sunday and Thursday only.    . bisoprolol (ZEBETA) 5 MG tablet Take 1 tablet (5 mg total) by mouth daily. 30 tablet 11  . Calcium Carbonate-Vit D-Min (CALCIUM 600+D PLUS MINERALS) 600-400 MG-UNIT CHEW Chew 2 tablets every morning by mouth.     . Cholecalciferol (VITAMIN D3) 10000 units TABS Take 10,000 Units 4 (four) times a week by mouth. Monday, Tuesday, Wednesday, & Thursday    . clopidogrel (PLAVIX) 75 MG tablet Take 1 tablet (75 mg total) by mouth daily. 90 tablet 3  . dexlansoprazole (DEXILANT) 60 MG capsule TAKE 1CAP BY MOUTH EVERY MORNING BEFORE BREAKFAST. 90 capsule 1  . dimenhyDRINATE (DRAMAMINE) 50 MG tablet Take 50 mg every 8 (eight) hours as needed by mouth (for dizziness/vertigo).     . ferrous sulfate 325 (65 FE) MG tablet Take 325 mg every Monday, Wednesday, and Friday by mouth. In the morning with breakfast.  3  . fluticasone (FLONASE) 50 MCG/ACT nasal spray Place 1 spray 2 (two) times daily as needed into both nostrils (for allergies.).   6  . fluticasone furoate-vilanterol (BREO ELLIPTA) 100-25  MCG/INH AEPB Inhale 1 puff into the lungs daily. 30 each 11  . gabapentin (NEURONTIN) 100 MG capsule Take 100 mg by mouth 3 (three) times daily.     Marland Kitchen HYDROcodone-acetaminophen (NORCO/VICODIN) 5-325 MG tablet Take 1-2 tablets by mouth every 4 (four) hours as needed for moderate pain ((score 4 to 6)). 30 tablet 0  . ipratropium (ATROVENT) 0.03 % nasal spray Place 1 spray into both nostrils at bedtime.     Marland Kitchen loratadine (CLARITIN) 10 MG tablet Take 10 mg by mouth daily.     Marland Kitchen  NASAL SPRAY SALINE NA Place 1 spray 4 (four) times daily as needed into both nostrils (congestion).     . ondansetron (ZOFRAN) 4 MG tablet Take 2 mg 2 (two) times daily by mouth. In the morning & with hydrocodone at bedtime.    Vladimir Faster Glycol-Propyl Glycol (LUBRICANT EYE DROPS) 0.4-0.3 % SOLN Apply 1 drop to eye 4 (four) times daily.     . polyethylene glycol powder (GLYCOLAX/MIRALAX) powder Take 17 g at bedtime by mouth.    . primidone (MYSOLINE) 50 MG tablet TAKE 1/2 TABLET BY MOUTH TWICE A DAY (Patient taking differently: TAKE 0.5 TABLET (25 MG) BY MOUTH TWICE DAILY (IN THE MORNING & AFTER LUNCH WITH GABAPENTIN)) 90 tablet 1  . Probiotic Product (PROBIOTIC PO) Take 1 capsule daily with lunch by mouth.     . Psyllium (METAMUCIL FIBER PO) Take 1 scoop by mouth daily.      No current facility-administered medications on file prior to visit.     BP (!) 120/56 (BP Location: Left Arm)   Temp 98.4 F (36.9 C) (Oral)   Wt 137 lb (62.1 kg)   BMI 23.52 kg/m       Objective:   Physical Exam  Constitutional: She is oriented to person, place, and time. She appears well-developed and well-nourished. No distress.  Cardiovascular: Normal rate, regular rhythm, normal heart sounds and intact distal pulses. Exam reveals no gallop and no friction rub.  No murmur heard. Pulmonary/Chest: Breath sounds normal. No respiratory distress. She has no wheezes. She has no rales. She exhibits no tenderness.  Musculoskeletal: She exhibits  edema (+ 1 pitting edema in bilateral lower extremities R>L) and tenderness.  No pain in calf   Neurological: She is alert and oriented to person, place, and time.  Skin: Skin is warm and dry. No rash noted. She is not diaphoretic. No erythema. No pallor.  No redness or warmth noted   Psychiatric: She has a normal mood and affect. Her behavior is normal. Judgment and thought content normal.  Nursing note and vitals reviewed.     Assessment & Plan:  1. Lower extremity edema - Likely from increase in gabapentin  - Go back to 100 mg daily  - Follow up if no improvement in the next week  - Continue to elevated and wear compression socks   [name], NP

## 2017-07-29 NOTE — Patient Instructions (Signed)
Go back to taking Gabapentin 100 mg Daily. I think this is what is causing your swelling

## 2017-08-03 ENCOUNTER — Encounter: Payer: Self-pay | Admitting: Adult Health

## 2017-08-06 ENCOUNTER — Telehealth: Payer: Self-pay | Admitting: Family Medicine

## 2017-08-06 NOTE — Telephone Encounter (Signed)
Ok for verbal orders ?

## 2017-08-06 NOTE — Telephone Encounter (Signed)
Spoke to [place] and informed her to proceed with orders for physical therapy.  Call back if needed.  No further action needed.  Will close note.

## 2017-08-06 NOTE — Telephone Encounter (Signed)
Copied from [place] [id number]. Topic: General - Other >> Aug 06, 2017  2:29 PM Marin Olp L wrote: Reason for CRM: [place], PT completed admission visit Monday for hys therapy, and needs orders to continue PT w/ frequency 1x a wk for 1wk, 2x a wk for 3wks, and 1x a wk for 1wk.

## 2017-08-06 NOTE — Telephone Encounter (Signed)
Left a message for a return call.  Needs to be notified to proceed with orders.

## 2017-08-12 ENCOUNTER — Encounter: Payer: Self-pay | Admitting: Adult Health

## 2017-08-14 ENCOUNTER — Encounter: Payer: Self-pay | Admitting: Adult Health

## 2017-08-14 ENCOUNTER — Ambulatory Visit (INDEPENDENT_AMBULATORY_CARE_PROVIDER_SITE_OTHER): Payer: Medicare Other | Admitting: Adult Health

## 2017-08-14 VITALS — BP 154/74 | Temp 98.5°F | Wt 132.0 lb

## 2017-08-14 DIAGNOSIS — D492 Neoplasm of unspecified behavior of bone, soft tissue, and skin: Secondary | ICD-10-CM

## 2017-08-14 NOTE — Progress Notes (Signed)
Subjective:    Patient ID: [name], female    DOB: [birth year], 84 y.o.   MRN: [id number]  HPI 84 year old female who  has a past medical history of Anemia, Arthritis, Asthma, Cataract, Chronic respiratory failure with hypoxia ([place]), Colitis, Colon cancer ([place]), Diabetes mellitus without complication ([place]), Diverticulitis, DVT (deep venous thrombosis) ([place]), Essential hypertension, Family history of adverse reaction to anesthesia, GERD (gastroesophageal reflux disease), Hearing impaired, Hiatal hernia, History of kidney stones, Hyperlipidemia, Hypertension, Pneumonia, PONV (postoperative nausea and vomiting), Seasonal allergies, and TIA (transient ischemic attack).  She presents to the office for growth on left lower leg. She is unsure when she first noticed this growth. Reports no pain or bleeding " unless I hit it against something".      Review of Systems See HPI   Past Medical History:  Diagnosis Date  . Anemia    iron deficiency  . Arthritis   . Asthma   . Cataract   . Chronic respiratory failure with hypoxia ([place])   . Colitis   . Colon cancer ([place])    basal cell nose  . Diabetes mellitus without complication ([place])    NO meds ever [place] related to steroid use. Has more problems eith hypoglycemia  . Diverticulitis   . DVT (deep venous thrombosis) ([place])   . Essential hypertension   . Family history of adverse reaction to anesthesia    both daughters have problems getting bp up after anesthesia  . GERD (gastroesophageal reflux disease)   . Hearing impaired   . Hiatal hernia    neuropathy  . History of kidney stones   . Hyperlipidemia   . Hypertension   . Pneumonia   . PONV (postoperative nausea and vomiting)    BP drops really low cant breathe well vomiting  . Seasonal allergies   . TIA (transient ischemic attack)    x2 2 years ago    Social History   Socioeconomic History  . Marital status: Widowed    Spouse name: Not on file  . Number of children: Not  on file  . Years of education: Not on file  . Highest education level: Not on file  Social Needs  . Financial resource strain: Not on file  . Food insecurity - worry: Not on file  . Food insecurity - inability: Not on file  . Transportation needs - medical: Not on file  . Transportation needs - non-medical: Not on file  Occupational History  . Not on file  Tobacco Use  . Smoking status: Never Smoker  . Smokeless tobacco: Never Used  Substance and Sexual Activity  . Alcohol use: No  . Drug use: No  . Sexual activity: Not Currently  Other Topics Concern  . Not on file  Social History Narrative  . Not on file    Past Surgical History:  Procedure Laterality Date  . APPENDECTOMY    . CHOLECYSTECTOMY    . COLON RESECTION  1967  . EYE SURGERY     cataract  . JOINT REPLACEMENT     Right hip Dr. Ninfa Linden 07/11/17  . KIDNEY SURGERY  1966   cyst  . TONSILLECTOMY    . TOTAL ABDOMINAL HYSTERECTOMY    . TOTAL HIP ARTHROPLASTY Right 07/11/2017   Procedure: RIGHT TOTAL HIP ARTHROPLASTY ANTERIOR APPROACH;  Surgeon: Mcarthur Rossetti, MD;  Location: [facility];  Service: Orthopedics;  Laterality: Right;    Family History  Problem Relation Age of Onset  . Breast cancer  Mother   . Heart attack Mother   . Emphysema Sister        smoked  . Prostate cancer Brother   . Heart disease Brother     Allergies  Allergen Reactions  . Prochlorperazine Other (See Comments)    tremor  . Celecoxib Nausea And Vomiting and Other (See Comments)    Body aches  . Codeine Nausea And Vomiting    Tolerates hydrocodone  . Fish Oil Nausea And Vomiting  . Fosamax [Alendronate Sodium] Nausea And Vomiting  . Morphine And Related Nausea And Vomiting  . Moxifloxacin Other (See Comments)    fatigue  . Oxycodone Nausea And Vomiting    Tolerates hydrocodone  . Pregabalin Swelling  . Propoxyphene Nausea And Vomiting  . Statins Other (See Comments)    Muscle pain  . Aspirin-Dipyridamole Er Other  (See Comments)    Headcache  . Buprenorphine Hcl Nausea And Vomiting    Current Outpatient Medications on File Prior to Visit  Medication Sig Dispense Refill  . acetaminophen (TYLENOL) 500 MG tablet Take 500 mg by mouth daily.    Marland Kitchen albuterol (PROVENTIL HFA;VENTOLIN HFA) 108 (90 BASE) MCG/ACT inhaler Inhale 1 puff into the lungs every 4 (four) hours as needed for wheezing or shortness of breath.     Marland Kitchen aspirin EC 81 MG tablet Take 81 mg 2 (two) times a week by mouth. Take 1 tablet by mouth every Sunday and Thursday only.    . bisoprolol (ZEBETA) 5 MG tablet Take 1 tablet (5 mg total) by mouth daily. 30 tablet 11  . Calcium Carbonate-Vit D-Min (CALCIUM 600+D PLUS MINERALS) 600-400 MG-UNIT CHEW Chew 2 tablets every morning by mouth.     . Cholecalciferol (VITAMIN D3) 10000 units TABS Take 10,000 Units 4 (four) times a week by mouth. Monday, Tuesday, Wednesday, & Thursday    . clopidogrel (PLAVIX) 75 MG tablet Take 1 tablet (75 mg total) by mouth daily. 90 tablet 3  . dexlansoprazole (DEXILANT) 60 MG capsule TAKE 1CAP BY MOUTH EVERY MORNING BEFORE BREAKFAST. 90 capsule 1  . dimenhyDRINATE (DRAMAMINE) 50 MG tablet Take 50 mg every 8 (eight) hours as needed by mouth (for dizziness/vertigo).     . ferrous sulfate 325 (65 FE) MG tablet Take 325 mg every Monday, Wednesday, and Friday by mouth. In the morning with breakfast.  3  . fluticasone (FLONASE) 50 MCG/ACT nasal spray Place 1 spray 2 (two) times daily as needed into both nostrils (for allergies.).   6  . fluticasone furoate-vilanterol (BREO ELLIPTA) 100-25 MCG/INH AEPB Inhale 1 puff into the lungs daily. 30 each 11  . gabapentin (NEURONTIN) 100 MG capsule Take 100 mg by mouth daily.     Marland Kitchen HYDROcodone-acetaminophen (NORCO/VICODIN) 5-325 MG tablet Take 1-2 tablets by mouth every 4 (four) hours as needed for moderate pain ((score 4 to 6)). 30 tablet 0  . ipratropium (ATROVENT) 0.03 % nasal spray Place 1 spray into both nostrils at bedtime.     Marland Kitchen  loratadine (CLARITIN) 10 MG tablet Take 10 mg by mouth daily.     Marland Kitchen NASAL SPRAY SALINE NA Place 1 spray 4 (four) times daily as needed into both nostrils (congestion).     . ondansetron (ZOFRAN) 4 MG tablet Take 2 mg 2 (two) times daily by mouth. In the morning & with hydrocodone at bedtime.    Vladimir Faster Glycol-Propyl Glycol (LUBRICANT EYE DROPS) 0.4-0.3 % SOLN Apply 1 drop to eye 4 (four) times daily.     Marland Kitchen  polyethylene glycol powder (GLYCOLAX/MIRALAX) powder Take 17 g at bedtime by mouth.    . primidone (MYSOLINE) 50 MG tablet TAKE 1/2 TABLET BY MOUTH TWICE A DAY (Patient taking differently: TAKE 0.5 TABLET (25 MG) BY MOUTH TWICE DAILY (IN THE MORNING & AFTER LUNCH WITH GABAPENTIN)) 90 tablet 1  . Probiotic Product (PROBIOTIC PO) Take 1 capsule daily with lunch by mouth.     . Psyllium (METAMUCIL FIBER PO) Take 1 scoop by mouth daily.      No current facility-administered medications on file prior to visit.     BP (!) 154/74   Temp 98.5 F (36.9 C)   Wt 132 lb (59.9 kg)   BMI 22.66 kg/m       Objective:   Physical Exam  Constitutional: She is oriented to person, place, and time. She appears well-developed and well-nourished. No distress.  Cardiovascular: Normal rate, regular rhythm, normal heart sounds and intact distal pulses. Exam reveals no gallop and no friction rub.  No murmur heard. Pulmonary/Chest: Effort normal and breath sounds normal. No respiratory distress. She has no wheezes. She has no rales. She exhibits no tenderness.  Neurological: She is alert and oriented to person, place, and time. She has normal reflexes. She displays normal reflexes. No cranial nerve deficit. She exhibits normal muscle tone. Coordination normal.  Skin: Skin is warm and dry. No rash noted. She is not diaphoretic. No erythema. No pallor.  1.25 cm skin lesion on left lateral aspect of left ankle. Appears multi colored and scaly.   Psychiatric: She has a normal mood and affect. Her behavior is  normal. Judgment and thought content normal.  Nursing note and vitals reviewed.     Assessment & Plan:  1. Skin neoplasm - Lesion able to be dislodged from skin with manipulation. Anesthesia with cold spray. No active bleeding   - Appears as actinic keratosis but will send for pathology report - Dermatology pathology - Consider referral to dermatology   [name], NP

## 2017-08-16 ENCOUNTER — Other Ambulatory Visit (INDEPENDENT_AMBULATORY_CARE_PROVIDER_SITE_OTHER): Payer: Self-pay | Admitting: Orthopaedic Surgery

## 2017-08-16 ENCOUNTER — Encounter: Payer: Self-pay | Admitting: Adult Health

## 2017-08-18 ENCOUNTER — Encounter: Payer: Self-pay | Admitting: Adult Health

## 2017-08-18 MED ORDER — HYDROCODONE-ACETAMINOPHEN 5-325 MG PO TABS: 1.0000 | ORAL_TABLET | ORAL | 0 refills | Status: DC | PRN

## 2017-08-18 NOTE — Telephone Encounter (Signed)
Duplicate

## 2017-08-18 NOTE — Telephone Encounter (Signed)
Please advise 

## 2017-08-19 MED ORDER — GABAPENTIN 100 MG PO CAPS: 100.0000 mg | ORAL_CAPSULE | Freq: Every day | ORAL | 0 refills | Status: DC

## 2017-08-19 NOTE — Telephone Encounter (Signed)
Call in #[phone] with no rf  

## 2017-08-19 NOTE — Telephone Encounter (Signed)
Refill of hydrocodone no longer needed.  Request for gabapentin sent to Dr. Sarajane Jews as Tommi Rumps is out of the office.

## 2017-08-20 ENCOUNTER — Other Ambulatory Visit: Payer: Self-pay | Admitting: Family Medicine

## 2017-08-20 DIAGNOSIS — L859 Epidermal thickening, unspecified: Secondary | ICD-10-CM

## 2017-08-21 ENCOUNTER — Telehealth: Payer: Self-pay | Admitting: Family Medicine

## 2017-08-21 ENCOUNTER — Encounter (INDEPENDENT_AMBULATORY_CARE_PROVIDER_SITE_OTHER): Payer: Self-pay | Admitting: Orthopaedic Surgery

## 2017-08-21 ENCOUNTER — Ambulatory Visit (INDEPENDENT_AMBULATORY_CARE_PROVIDER_SITE_OTHER): Payer: Medicare Other | Admitting: Orthopaedic Surgery

## 2017-08-21 DIAGNOSIS — Z96641 Presence of right artificial hip joint: Secondary | ICD-10-CM

## 2017-08-21 NOTE — Telephone Encounter (Signed)
Copied from [place] [id number]. Topic: Referral - Question >> Aug 21, 2017  4:29 PM Bea Graff, NT wrote: Reason for CRM: Patients daughter calling needing a referral for her mom for a dermatology to do a biopsy on her left lower leg. They would like this sent asap in order to get an appt.

## 2017-08-21 NOTE — Progress Notes (Signed)
The patient is 6 weeks status post a right total hip arthroplasty directed approach.  She is 84 years old and doing excellent.  She says she is ready to drive and I agree with this.  She is ambulate with a walker and doing well with physical therapy.  She says she is just taking Tylenol during the daytime and occasional narcotic at bedtime  On exam her incision looks good on her right hip.  There is no seroma.  She tolerates moving hip around easily.  Her leg lengths are equal.  At this point she is doing well enough that we do not need to see her back for 3 months.  We will get a low AP pelvis at that standpoint.  If she looks good at that visit she will follow-up as needed after that visit.

## 2017-08-21 NOTE — Telephone Encounter (Signed)
Referral placed on 08/20/17.  No further action required.

## 2017-08-26 ENCOUNTER — Encounter: Payer: Self-pay | Admitting: Adult Health

## 2017-08-27 ENCOUNTER — Other Ambulatory Visit: Payer: Self-pay | Admitting: Adult Health

## 2017-08-27 ENCOUNTER — Ambulatory Visit: Payer: Self-pay | Admitting: Adult Health

## 2017-08-27 DIAGNOSIS — R897 Abnormal histological findings in specimens from other organs, systems and tissues: Secondary | ICD-10-CM

## 2017-08-29 NOTE — Telephone Encounter (Signed)
Sent to the pharmacy by e-scribe. 

## 2017-08-29 NOTE — Telephone Encounter (Signed)
Ok to refill for one year  

## 2017-09-04 ENCOUNTER — Telehealth: Payer: Self-pay

## 2017-09-04 NOTE — Telephone Encounter (Signed)
Refill 90 day supply for Gabapentin 90 day supply

## 2017-09-05 MED ORDER — GABAPENTIN 100 MG PO CAPS: 100.0000 mg | ORAL_CAPSULE | Freq: Every day | ORAL | 6 refills | Status: DC

## 2017-09-05 NOTE — Telephone Encounter (Signed)
Rx was refilled

## 2017-09-11 ENCOUNTER — Other Ambulatory Visit: Payer: Self-pay

## 2017-09-11 MED ORDER — GABAPENTIN 100 MG PO CAPS: 100.0000 mg | ORAL_CAPSULE | Freq: Every day | ORAL | 2 refills | Status: AC

## 2017-09-18 ENCOUNTER — Other Ambulatory Visit: Payer: Self-pay | Admitting: Adult Health

## 2017-09-18 ENCOUNTER — Encounter: Payer: Self-pay | Admitting: Adult Health

## 2017-09-18 MED ORDER — HYDROCODONE-ACETAMINOPHEN 5-325 MG PO TABS: ORAL_TABLET | ORAL | 0 refills | Status: DC

## 2017-10-01 ENCOUNTER — Other Ambulatory Visit: Payer: Self-pay | Admitting: Adult Health

## 2017-10-01 NOTE — Telephone Encounter (Signed)
Ok to refill 

## 2017-10-01 NOTE — Telephone Encounter (Signed)
Sent to the pharmacy by e-scribe. 

## 2017-10-02 ENCOUNTER — Ambulatory Visit (INDEPENDENT_AMBULATORY_CARE_PROVIDER_SITE_OTHER): Payer: Medicare Other | Admitting: Adult Health

## 2017-10-02 ENCOUNTER — Encounter: Payer: Self-pay | Admitting: Adult Health

## 2017-10-02 VITALS — BP 190/90 | HR 59 | Temp 97.8°F | Wt 129.0 lb

## 2017-10-02 DIAGNOSIS — R251 Tremor, unspecified: Secondary | ICD-10-CM | POA: Diagnosis not present

## 2017-10-02 DIAGNOSIS — J0140 Acute pansinusitis, unspecified: Secondary | ICD-10-CM | POA: Diagnosis not present

## 2017-10-02 DIAGNOSIS — M542 Cervicalgia: Secondary | ICD-10-CM | POA: Diagnosis not present

## 2017-10-02 LAB — BASIC METABOLIC PANEL
BUN: 14 mg/dL (ref 6–23)
CHLORIDE: 94 meq/L — AB (ref 96–112)
CO2: 38 meq/L — AB (ref 19–32)
CREATININE: 0.74 mg/dL (ref 0.40–1.20)
Calcium: 9.6 mg/dL (ref 8.4–10.5)
GFR: 77.57 mL/min (ref >60.00)
GLUCOSE: 90 mg/dL (ref 70–99)
POTASSIUM: 4.7 meq/L (ref 3.5–5.1)
Sodium: 136 mEq/L (ref 135–145)

## 2017-10-02 LAB — MAGNESIUM: Magnesium: 1.9 mg/dL (ref 1.5–2.5)

## 2017-10-02 MED ORDER — DOXYCYCLINE HYCLATE 100 MG PO CAPS: 100.0000 mg | ORAL_CAPSULE | Freq: Two times a day (BID) | ORAL | 0 refills | Status: DC

## 2017-10-02 NOTE — Progress Notes (Signed)
Subjective:    Patient ID: [name], female    DOB: [birth year], 84 y.o.   MRN: [id number]  HPI 84 year old female who  has a past medical history of Anemia, Arthritis, Asthma, Cataract, Chronic respiratory failure with hypoxia ([place]), Colitis, Colon cancer ([place]), Diabetes mellitus without complication ([place]), Diverticulitis, DVT (deep venous thrombosis) ([place]), Essential hypertension, Family history of adverse reaction to anesthesia, GERD (gastroesophageal reflux disease), Hearing impaired, Hiatal hernia, History of kidney stones, Hyperlipidemia, Hypertension, Pneumonia, PONV (postoperative nausea and vomiting), Seasonal allergies, and TIA (transient ischemic attack).  She presents to the office today for multiple complaints.   1.  Sinusitis.  Over the last 7-10 days has been complaining of sinus congestion, sinus pain and pressure, headache, and a productive cough with yellow sputum.  Eyes any fevers or chills.  Has been using Flonase Atrovent at home with no relief.  2.  She has a history of essential tremor for which she takes 25 mg of primidone twice daily.  Over unknown unknown amount of time she feels as though her tremors in her upper extremities have been slowly becoming more pronounced.  Most recently she is also noticed shaking in her head that is more apparent when she turns her head to the right.  She does endorse mild upper back pain, which is chronic in nature due to degenerative disc disease and also reports increased episodes of vertigo.  She has seen physical therapy in the past for her neck and and vertigo and she was wondering if it would be okay if she tried physical therapy again.  3.  She reports that she is slowly tapering off narcotic pain medication.  Currently taking one half tab daily and as needed.  She would like to stop this medication  .[facility] for neck pain   Review of Systems  See HPI   Past Medical History:  Diagnosis Date  . Anemia    iron deficiency  .  Arthritis   . Asthma   . Cataract   . Chronic respiratory failure with hypoxia ([place])   . Colitis   . Colon cancer ([place])    basal cell nose  . Diabetes mellitus without complication ([place])    NO meds ever [place] related to steroid use. Has more problems eith hypoglycemia  . Diverticulitis   . DVT (deep venous thrombosis) ([place])   . Essential hypertension   . Family history of adverse reaction to anesthesia    both daughters have problems getting bp up after anesthesia  . GERD (gastroesophageal reflux disease)   . Hearing impaired   . Hiatal hernia    neuropathy  . History of kidney stones   . Hyperlipidemia   . Hypertension   . Pneumonia   . PONV (postoperative nausea and vomiting)    BP drops really low cant breathe well vomiting  . Seasonal allergies   . TIA (transient ischemic attack)    x2 2 years ago    Social History   Socioeconomic History  . Marital status: Widowed    Spouse name: Not on file  . Number of children: Not on file  . Years of education: Not on file  . Highest education level: Not on file  Social Needs  . Financial resource strain: Not on file  . Food insecurity - worry: Not on file  . Food insecurity - inability: Not on file  . Transportation needs - medical: Not on file  . Transportation needs - non-medical: Not  on file  Occupational History  . Not on file  Tobacco Use  . Smoking status: Never Smoker  . Smokeless tobacco: Never Used  Substance and Sexual Activity  . Alcohol use: No  . Drug use: No  . Sexual activity: Not Currently  Other Topics Concern  . Not on file  Social History Narrative  . Not on file    Past Surgical History:  Procedure Laterality Date  . APPENDECTOMY    . CHOLECYSTECTOMY    . COLON RESECTION  1967  . EYE SURGERY     cataract  . JOINT REPLACEMENT     Right hip Dr. Ninfa Linden 07/11/17  . KIDNEY SURGERY  1966   cyst  . TONSILLECTOMY    . TOTAL ABDOMINAL HYSTERECTOMY    . TOTAL HIP ARTHROPLASTY Right  07/11/2017   Procedure: RIGHT TOTAL HIP ARTHROPLASTY ANTERIOR APPROACH;  Surgeon: Mcarthur Rossetti, MD;  Location: [facility];  Service: Orthopedics;  Laterality: Right;    Family History  Problem Relation Age of Onset  . Breast cancer Mother   . Heart attack Mother   . Emphysema Sister        smoked  . Prostate cancer Brother   . Heart disease Brother     Allergies  Allergen Reactions  . Prochlorperazine Other (See Comments)    tremor  . Celecoxib Nausea And Vomiting and Other (See Comments)    Body aches  . Codeine Nausea And Vomiting    Tolerates hydrocodone  . Fish Oil Nausea And Vomiting  . Fosamax [Alendronate Sodium] Nausea And Vomiting  . Morphine And Related Nausea And Vomiting  . Moxifloxacin Other (See Comments)    fatigue  . Oxycodone Nausea And Vomiting    Tolerates hydrocodone  . Pregabalin Swelling  . Propoxyphene Nausea And Vomiting  . Statins Other (See Comments)    Muscle pain  . Aspirin-Dipyridamole Er Other (See Comments)    Headcache  . Buprenorphine Hcl Nausea And Vomiting    Current Outpatient Medications on File Prior to Visit  Medication Sig Dispense Refill  . acetaminophen (TYLENOL) 500 MG tablet Take 500 mg by mouth daily.    Marland Kitchen albuterol (PROVENTIL HFA;VENTOLIN HFA) 108 (90 BASE) MCG/ACT inhaler Inhale 1 puff into the lungs every 4 (four) hours as needed for wheezing or shortness of breath.     Marland Kitchen aspirin EC 81 MG tablet Take 81 mg 2 (two) times a week by mouth. Take 1 tablet by mouth every Sunday and Thursday only.    . bisoprolol (ZEBETA) 5 MG tablet Take 1 tablet (5 mg total) by mouth daily. 30 tablet 11  . Calcium Carbonate-Vit D-Min (CALCIUM 600+D PLUS MINERALS) 600-400 MG-UNIT CHEW Chew 2 tablets every morning by mouth.     . Cholecalciferol (VITAMIN D3) 10000 units TABS Take 10,000 Units 4 (four) times a week by mouth. Monday, Tuesday, Wednesday, & Thursday    . clopidogrel (PLAVIX) 75 MG tablet Take 1 tablet (75 mg total) by mouth  daily. 90 tablet 3  . dexlansoprazole (DEXILANT) 60 MG capsule TAKE 1 CAPSULE BY MOUTH EVERY MORNING BEFORE BREAKFAST. 90 capsule 3  . dimenhyDRINATE (DRAMAMINE) 50 MG tablet Take 50 mg every 8 (eight) hours as needed by mouth (for dizziness/vertigo).     . ferrous sulfate 325 (65 FE) MG tablet Take 325 mg every Monday, Wednesday, and Friday by mouth. In the morning with breakfast.  3  . fluticasone (FLONASE) 50 MCG/ACT nasal spray Place 1 spray 2 (two) times daily  as needed into both nostrils (for allergies.).   6  . fluticasone furoate-vilanterol (BREO ELLIPTA) 100-25 MCG/INH AEPB Inhale 1 puff into the lungs daily. 30 each 11  . gabapentin (NEURONTIN) 100 MG capsule Take 1 capsule (100 mg total) by mouth daily. 90 capsule 2  . HYDROcodone-acetaminophen (NORCO/VICODIN) 5-325 MG tablet Take 1 to 1/2 tab at bedtime as needed 30 tablet 0  . ipratropium (ATROVENT) 0.03 % nasal spray Place 1 spray into both nostrils at bedtime.     Marland Kitchen loratadine (CLARITIN) 10 MG tablet Take 10 mg by mouth daily.     Marland Kitchen NASAL SPRAY SALINE NA Place 1 spray 4 (four) times daily as needed into both nostrils (congestion).     . ondansetron (ZOFRAN) 4 MG tablet Take 2 mg 2 (two) times daily by mouth. In the morning & with hydrocodone at bedtime.    Vladimir Faster Glycol-Propyl Glycol (LUBRICANT EYE DROPS) 0.4-0.3 % SOLN Apply 1 drop to eye 4 (four) times daily.     . polyethylene glycol powder (GLYCOLAX/MIRALAX) powder Take 17 g at bedtime by mouth.    . primidone (MYSOLINE) 50 MG tablet TAKE 1/2 TABLET BY MOUTH TWICE A DAY (Patient taking differently: TAKE 0.5 TABLET (25 MG) BY MOUTH TWICE DAILY (IN THE MORNING & AFTER LUNCH WITH GABAPENTIN)) 90 tablet 1  . Probiotic Product (PROBIOTIC PO) Take 1 capsule daily with lunch by mouth.     . Psyllium (METAMUCIL FIBER PO) Take 1 scoop by mouth daily.     [facility] ophthalmic ointment APPLY IN BOTH EYES AT BEDTIME  0  . tobramycin-dexamethasone (TOBRADEX) ophthalmic solution INSTILL  1 DROP INTO BOTH EYES 4 TIMES A DAY  0   No current facility-administered medications on file prior to visit.     BP (!) 190/90 (BP Location: Right Arm)   Pulse (!) 59   Temp 97.8 F (36.6 C) (Oral)   Wt 129 lb (58.5 kg)   SpO2 95%   BMI 22.14 kg/m       Objective:   Physical Exam  Constitutional: She is oriented to person, place, and time. She appears well-developed and well-nourished. No distress.  HENT:  Right Ear: Hearing, tympanic membrane, external ear and ear canal normal.  Left Ear: Hearing, tympanic membrane, external ear and ear canal normal.  Nose: Mucosal edema and rhinorrhea present. Right sinus exhibits frontal sinus tenderness. Left sinus exhibits maxillary sinus tenderness and frontal sinus tenderness.  Mouth/Throat: Uvula is midline, oropharynx is clear and moist and mucous membranes are normal.  Eyes: Right eye exhibits no nystagmus. Left eye exhibits no nystagmus.  Cardiovascular: Normal rate, regular rhythm, normal heart sounds and intact distal pulses. Exam reveals no gallop and no friction rub.  No murmur heard. Pulmonary/Chest: Effort normal and breath sounds normal. No respiratory distress. She has no wheezes. She has no rales. She exhibits no tenderness.  Musculoskeletal:       Cervical back: She exhibits decreased range of motion, tenderness, pain and spasm. She exhibits no bony tenderness, no edema and no deformity.  Neurological: She is alert and oriented to person, place, and time. She has normal strength. She displays tremor. No cranial nerve deficit or sensory deficit.  Essential tremor noted in bilateral upper extremities, seems to be slightly worse than right rather than left.  She does have a tremor of the head when turning her head to the right, this tremor resolves once her head is midline.  Small tremor with turning head to the left.  Skin: Skin is warm and dry. No rash noted. She is not diaphoretic. No erythema. No pallor.  Psychiatric: She has a  normal mood and affect. Her behavior is normal. Judgment and thought content normal.  Nursing note and vitals reviewed.     Assessment & Plan:  1. Acute non-recurrent pansinusitis  - doxycycline (VIBRAMYCIN) 100 MG capsule; Take 1 capsule (100 mg total) by mouth 2 (two) times daily.  Dispense: 14 capsule; Refill: 0 -Continue to use Flonase and can use Claritin as well to help with drainage.  Stay hydrated and rest.  Follow-up in 2-3 days if no improvement  2. Tremor -We will check BMP and mag.  Likely increase primidone to 50 mg in a.m. and 25 mg p.m. to see if this helps.  Sitter referral to neurology for further workup - Basic Metabolic Panel - Magnesium  3. Neck pain -Used to be muscular in nature.  She did not have any spinal tenderness with palpation.  We will have her use a heating pad at home and I have no problem referring her back to physical therapy - Ambulatory referral to Physical Therapy  *Of note her blood pressure was elevated in the office today with the last reading being 190/90.  She reports taking her medications on a daily basis and she has been going to physical therapy status post hip surgery for some time now.  Patient reports that during physical therapy her blood pressure is "always 130/80".  I am going to have a daughter check her blood pressure tomorrow morning and report back to me.  [name], NP

## 2017-10-02 NOTE — Progress Notes (Signed)
Subjective:    Patient ID: [name], female    DOB: Mar 09, 1921, 84 y.o.   MRN: [id number]  Cough   Sinusitis  Associated symptoms include coughing.  Dizziness  Associated symptoms include coughing.        Review of Systems  Respiratory: Positive for cough.   Neurological: Positive for dizziness.       Past Medical History:  Diagnosis Date  . Anemia    iron deficiency  . Arthritis   . Asthma   . Cataract   . Chronic respiratory failure with hypoxia ([place])   . Colitis   . Colon cancer ([place])    basal cell nose  . Diabetes mellitus without complication ([place])    NO meds ever [place] related to steroid use. Has more problems eith hypoglycemia  . Diverticulitis   . DVT (deep venous thrombosis) ([place])   . Essential hypertension   . Family history of adverse reaction to anesthesia    both daughters have problems getting bp up after anesthesia  . GERD (gastroesophageal reflux disease)   . Hearing impaired   . Hiatal hernia    neuropathy  . History of kidney stones   . Hyperlipidemia   . Hypertension   . Pneumonia   . PONV (postoperative nausea and vomiting)    BP drops really low cant breathe well vomiting  . Seasonal allergies   . TIA (transient ischemic attack)    x2 2 years ago    Social History   Socioeconomic History  . Marital status: Widowed    Spouse name: Not on file  . Number of children: Not on file  . Years of education: Not on file  . Highest education level: Not on file  Social Needs  . Financial resource strain: Not on file  . Food insecurity - worry: Not on file  . Food insecurity - inability: Not on file  . Transportation needs - medical: Not on file  . Transportation needs - non-medical: Not on file  Occupational History  . Not on file  Tobacco Use  . Smoking status: Never Smoker  . Smokeless tobacco: Never Used  Substance and Sexual Activity  . Alcohol use: No  . Drug use: No  . Sexual activity: Not Currently  Other Topics  Concern  . Not on file  Social History Narrative  . Not on file    Past Surgical History:  Procedure Laterality Date  . APPENDECTOMY    . CHOLECYSTECTOMY    . COLON RESECTION  1967  . EYE SURGERY     cataract  . JOINT REPLACEMENT     Right hip Dr. Ninfa Linden 07/11/17  . KIDNEY SURGERY  1966   cyst  . TONSILLECTOMY    . TOTAL ABDOMINAL HYSTERECTOMY    . TOTAL HIP ARTHROPLASTY Right 07/11/2017   Procedure: RIGHT TOTAL HIP ARTHROPLASTY ANTERIOR APPROACH;  Surgeon: Mcarthur Rossetti, MD;  Location: [facility];  Service: Orthopedics;  Laterality: Right;    Family History  Problem Relation Age of Onset  . Breast cancer Mother   . Heart attack Mother   . Emphysema Sister        smoked  . Prostate cancer Brother   . Heart disease Brother     Allergies  Allergen Reactions  . Prochlorperazine Other (See Comments)    tremor  . Celecoxib Nausea And Vomiting and Other (See Comments)    Body aches  . Codeine Nausea And Vomiting    Tolerates hydrocodone  .  Fish Oil Nausea And Vomiting  . Fosamax [Alendronate Sodium] Nausea And Vomiting  . Morphine And Related Nausea And Vomiting  . Moxifloxacin Other (See Comments)    fatigue  . Oxycodone Nausea And Vomiting    Tolerates hydrocodone  . Pregabalin Swelling  . Propoxyphene Nausea And Vomiting  . Statins Other (See Comments)    Muscle pain  . Aspirin-Dipyridamole Er Other (See Comments)    Headcache  . Buprenorphine Hcl Nausea And Vomiting    Current Outpatient Medications on File Prior to Visit  Medication Sig Dispense Refill  . acetaminophen (TYLENOL) 500 MG tablet Take 500 mg by mouth daily.    Marland Kitchen albuterol (PROVENTIL HFA;VENTOLIN HFA) 108 (90 BASE) MCG/ACT inhaler Inhale 1 puff into the lungs every 4 (four) hours as needed for wheezing or shortness of breath.     Marland Kitchen aspirin EC 81 MG tablet Take 81 mg 2 (two) times a week by mouth. Take 1 tablet by mouth every Sunday and Thursday only.    . bisoprolol (ZEBETA) 5 MG tablet  Take 1 tablet (5 mg total) by mouth daily. 30 tablet 11  . Calcium Carbonate-Vit D-Min (CALCIUM 600+D PLUS MINERALS) 600-400 MG-UNIT CHEW Chew 2 tablets every morning by mouth.     . Cholecalciferol (VITAMIN D3) 10000 units TABS Take 10,000 Units 4 (four) times a week by mouth. Monday, Tuesday, Wednesday, & Thursday    . clopidogrel (PLAVIX) 75 MG tablet Take 1 tablet (75 mg total) by mouth daily. 90 tablet 3  . dexlansoprazole (DEXILANT) 60 MG capsule TAKE 1 CAPSULE BY MOUTH EVERY MORNING BEFORE BREAKFAST. 90 capsule 3  . dimenhyDRINATE (DRAMAMINE) 50 MG tablet Take 50 mg every 8 (eight) hours as needed by mouth (for dizziness/vertigo).     . ferrous sulfate 325 (65 FE) MG tablet Take 325 mg every Monday, Wednesday, and Friday by mouth. In the morning with breakfast.  3  . fluticasone (FLONASE) 50 MCG/ACT nasal spray Place 1 spray 2 (two) times daily as needed into both nostrils (for allergies.).   6  . fluticasone furoate-vilanterol (BREO ELLIPTA) 100-25 MCG/INH AEPB Inhale 1 puff into the lungs daily. 30 each 11  . gabapentin (NEURONTIN) 100 MG capsule Take 1 capsule (100 mg total) by mouth daily. 90 capsule 2  . HYDROcodone-acetaminophen (NORCO/VICODIN) 5-325 MG tablet Take 1 to 1/2 tab at bedtime as needed 30 tablet 0  . ipratropium (ATROVENT) 0.03 % nasal spray Place 1 spray into both nostrils at bedtime.     Marland Kitchen loratadine (CLARITIN) 10 MG tablet Take 10 mg by mouth daily.     Marland Kitchen NASAL SPRAY SALINE NA Place 1 spray 4 (four) times daily as needed into both nostrils (congestion).     . ondansetron (ZOFRAN) 4 MG tablet Take 2 mg 2 (two) times daily by mouth. In the morning & with hydrocodone at bedtime.    Vladimir Faster Glycol-Propyl Glycol (LUBRICANT EYE DROPS) 0.4-0.3 % SOLN Apply 1 drop to eye 4 (four) times daily.     . polyethylene glycol powder (GLYCOLAX/MIRALAX) powder Take 17 g at bedtime by mouth.    . primidone (MYSOLINE) 50 MG tablet TAKE 1/2 TABLET BY MOUTH TWICE A DAY (Patient taking  differently: TAKE 0.5 TABLET (25 MG) BY MOUTH TWICE DAILY (IN THE MORNING & AFTER LUNCH WITH GABAPENTIN)) 90 tablet 1  . Probiotic Product (PROBIOTIC PO) Take 1 capsule daily with lunch by mouth.     . Psyllium (METAMUCIL FIBER PO) Take 1 scoop by mouth daily.     Marland Kitchen  TOBRADEX ophthalmic ointment APPLY IN BOTH EYES AT BEDTIME  0  . tobramycin-dexamethasone (TOBRADEX) ophthalmic solution INSTILL 1 DROP INTO BOTH EYES 4 TIMES A DAY  0   No current facility-administered medications on file prior to visit.     BP (!) 220/90 (BP Location: Right Arm)   Pulse (!) 59   Temp 97.8 F (36.6 C) (Oral)   Wt 129 lb (58.5 kg)   SpO2 95%   BMI 22.14 kg/m   Objective:   Physical Exam        Assessment & Plan:

## 2017-10-02 NOTE — Progress Notes (Addendum)
Subjective:    Patient ID: [name], female    DOB: 01/26/1921, 84 y.o.   MRN: [id number]  84 year old accompanied by daughter presents with several issues.  She is recovering well from right hip replacement and active with physical therapy. But since surgery in December she has developed worsening of her essential tremor of her hands and has developed a tremor of her head especially with turning her head to the right.   She is also experiencing more vertigo and sinus congestion, left-seded headache since December.  Headache is not associated with nausea, vomiting or light sensitivity. She has contributed some of her vertigo to the absence of PT for her neck which ended when she began post-op PT for her hip.  She has not had any new medications added or stopped since her last appointment and reports she is taking everything as prescribed with the exception of her Vicodin. She has decreased this dose to 1/2 tab at bedtime with the hopes of discontinuation. Her daughter states her mother is most concerned about the tremor being associated with "parkinson's" as her sister had it and with some memory loss.  She was at a funeral yesterday and was unable to recall names of people she has known for years.  This is concerning for her and has only become an issue since surgery per the patient and daughter.  She denies drooling, shuffling gait, falling, dropping items or hallucinations.     Cough  Associated symptoms include headaches and rhinorrhea. Pertinent negatives include no chills, ear pain, fever, postnasal drip, sore throat, shortness of breath or wheezing.  Sinusitis  Associated symptoms include congestion, coughing, headaches, neck pain and sinus pressure. Pertinent negatives include no chills, ear pain, shortness of breath, sneezing or sore throat.  Dizziness  Associated symptoms include congestion, coughing, headaches, neck pain and weakness. Pertinent negatives include no chills, fatigue, fever  or sore throat.      Review of Systems  Constitutional: Negative for chills, fatigue and fever.  HENT: Positive for congestion, rhinorrhea, sinus pressure and sinus pain. Negative for ear discharge, ear pain, postnasal drip, sneezing and sore throat.   Eyes: Negative.   Respiratory: Positive for cough. Negative for chest tightness, shortness of breath and wheezing.   Cardiovascular: Negative.   Gastrointestinal: Negative.   Musculoskeletal: Positive for neck pain and neck stiffness.  Neurological: Positive for dizziness, tremors, weakness and headaches. Negative for light-headedness.   Past Medical History:  Diagnosis Date  . Anemia    iron deficiency  . Arthritis   . Asthma   . Cataract   . Chronic respiratory failure with hypoxia ([place])   . Colitis   . Colon cancer ([place])    basal cell nose  . Diabetes mellitus without complication ([place])    NO meds ever [place] related to steroid use. Has more problems eith hypoglycemia  . Diverticulitis   . DVT (deep venous thrombosis) ([place])   . Essential hypertension   . Family history of adverse reaction to anesthesia    both daughters have problems getting bp up after anesthesia  . GERD (gastroesophageal reflux disease)   . Hearing impaired   . Hiatal hernia    neuropathy  . History of kidney stones   . Hyperlipidemia   . Hypertension   . Pneumonia   . PONV (postoperative nausea and vomiting)    BP drops really low cant breathe well vomiting  . Seasonal allergies   . TIA (transient ischemic attack)  x2 2 years ago    Social History   Socioeconomic History  . Marital status: Widowed    Spouse name: Not on file  . Number of children: Not on file  . Years of education: Not on file  . Highest education level: Not on file  Social Needs  . Financial resource strain: Not on file  . Food insecurity - worry: Not on file  . Food insecurity - inability: Not on file  . Transportation needs - medical: Not on file  .  Transportation needs - non-medical: Not on file  Occupational History  . Not on file  Tobacco Use  . Smoking status: Never Smoker  . Smokeless tobacco: Never Used  Substance and Sexual Activity  . Alcohol use: No  . Drug use: No  . Sexual activity: Not Currently  Other Topics Concern  . Not on file  Social History Narrative  . Not on file    Past Surgical History:  Procedure Laterality Date  . APPENDECTOMY    . CHOLECYSTECTOMY    . COLON RESECTION  1967  . EYE SURGERY     cataract  . JOINT REPLACEMENT     Right hip Dr. Ninfa Linden 07/11/17  . KIDNEY SURGERY  1966   cyst  . TONSILLECTOMY    . TOTAL ABDOMINAL HYSTERECTOMY    . TOTAL HIP ARTHROPLASTY Right 07/11/2017   Procedure: RIGHT TOTAL HIP ARTHROPLASTY ANTERIOR APPROACH;  Surgeon: Mcarthur Rossetti, MD;  Location: [facility];  Service: Orthopedics;  Laterality: Right;    Family History  Problem Relation Age of Onset  . Breast cancer Mother   . Heart attack Mother   . Emphysema Sister        smoked  . Prostate cancer Brother   . Heart disease Brother     Allergies  Allergen Reactions  . Prochlorperazine Other (See Comments)    tremor  . Celecoxib Nausea And Vomiting and Other (See Comments)    Body aches  . Codeine Nausea And Vomiting    Tolerates hydrocodone  . Fish Oil Nausea And Vomiting  . Fosamax [Alendronate Sodium] Nausea And Vomiting  . Morphine And Related Nausea And Vomiting  . Moxifloxacin Other (See Comments)    fatigue  . Oxycodone Nausea And Vomiting    Tolerates hydrocodone  . Pregabalin Swelling  . Propoxyphene Nausea And Vomiting  . Statins Other (See Comments)    Muscle pain  . Aspirin-Dipyridamole Er Other (See Comments)    Headcache  . Buprenorphine Hcl Nausea And Vomiting    Current Outpatient Medications on File Prior to Visit  Medication Sig Dispense Refill  . acetaminophen (TYLENOL) 500 MG tablet Take 500 mg by mouth daily.    Marland Kitchen albuterol (PROVENTIL HFA;VENTOLIN HFA) 108  (90 BASE) MCG/ACT inhaler Inhale 1 puff into the lungs every 4 (four) hours as needed for wheezing or shortness of breath.     Marland Kitchen aspirin EC 81 MG tablet Take 81 mg 2 (two) times a week by mouth. Take 1 tablet by mouth every Sunday and Thursday only.    . bisoprolol (ZEBETA) 5 MG tablet Take 1 tablet (5 mg total) by mouth daily. 30 tablet 11  . Calcium Carbonate-Vit D-Min (CALCIUM 600+D PLUS MINERALS) 600-400 MG-UNIT CHEW Chew 2 tablets every morning by mouth.     . Cholecalciferol (VITAMIN D3) 10000 units TABS Take 10,000 Units 4 (four) times a week by mouth. Monday, Tuesday, Wednesday, & Thursday    . clopidogrel (PLAVIX) 75 MG tablet  Take 1 tablet (75 mg total) by mouth daily. 90 tablet 3  . dexlansoprazole (DEXILANT) 60 MG capsule TAKE 1 CAPSULE BY MOUTH EVERY MORNING BEFORE BREAKFAST. 90 capsule 3  . dimenhyDRINATE (DRAMAMINE) 50 MG tablet Take 50 mg every 8 (eight) hours as needed by mouth (for dizziness/vertigo).     . ferrous sulfate 325 (65 FE) MG tablet Take 325 mg every Monday, Wednesday, and Friday by mouth. In the morning with breakfast.  3  . fluticasone (FLONASE) 50 MCG/ACT nasal spray Place 1 spray 2 (two) times daily as needed into both nostrils (for allergies.).   6  . fluticasone furoate-vilanterol (BREO ELLIPTA) 100-25 MCG/INH AEPB Inhale 1 puff into the lungs daily. 30 each 11  . gabapentin (NEURONTIN) 100 MG capsule Take 1 capsule (100 mg total) by mouth daily. 90 capsule 2  . HYDROcodone-acetaminophen (NORCO/VICODIN) 5-325 MG tablet Take 1 to 1/2 tab at bedtime as needed 30 tablet 0  . ipratropium (ATROVENT) 0.03 % nasal spray Place 1 spray into both nostrils at bedtime.     Marland Kitchen loratadine (CLARITIN) 10 MG tablet Take 10 mg by mouth daily.     Marland Kitchen NASAL SPRAY SALINE NA Place 1 spray 4 (four) times daily as needed into both nostrils (congestion).     . ondansetron (ZOFRAN) 4 MG tablet Take 2 mg 2 (two) times daily by mouth. In the morning & with hydrocodone at bedtime.    Vladimir Faster Glycol-Propyl Glycol (LUBRICANT EYE DROPS) 0.4-0.3 % SOLN Apply 1 drop to eye 4 (four) times daily.     . polyethylene glycol powder (GLYCOLAX/MIRALAX) powder Take 17 g at bedtime by mouth.    . primidone (MYSOLINE) 50 MG tablet TAKE 1/2 TABLET BY MOUTH TWICE A DAY (Patient taking differently: TAKE 0.5 TABLET (25 MG) BY MOUTH TWICE DAILY (IN THE MORNING & AFTER LUNCH WITH GABAPENTIN)) 90 tablet 1  . Probiotic Product (PROBIOTIC PO) Take 1 capsule daily with lunch by mouth.     . Psyllium (METAMUCIL FIBER PO) Take 1 scoop by mouth daily.     [facility] ophthalmic ointment APPLY IN BOTH EYES AT BEDTIME  0  . tobramycin-dexamethasone (TOBRADEX) ophthalmic solution INSTILL 1 DROP INTO BOTH EYES 4 TIMES A DAY  0   No current facility-administered medications on file prior to visit.     BP (!) 190/90 (BP Location: Right Arm)   Pulse (!) 59   Temp 97.8 F (36.6 C) (Oral)   Wt 129 lb (58.5 kg)   SpO2 95%   BMI 22.14 kg/m       Objective:   Physical Exam  Constitutional: She is oriented to person, place, and time.  HENT:  Head: Normocephalic and atraumatic.  Right Ear: Tympanic membrane normal.  Left Ear: Tympanic membrane normal.  Nose: Rhinorrhea present. No mucosal edema. Right sinus exhibits maxillary sinus tenderness and frontal sinus tenderness. Left sinus exhibits frontal sinus tenderness. Left sinus exhibits no maxillary sinus tenderness.  Mouth/Throat: Uvula is midline, oropharynx is clear and moist and mucous membranes are normal. No oropharyngeal exudate, posterior oropharyngeal edema or posterior oropharyngeal erythema.  Eyes: Conjunctivae are normal. Right eye exhibits no discharge. Left eye exhibits no discharge.  Cardiovascular: Normal rate, regular rhythm and normal heart sounds. Exam reveals no gallop and no friction rub.  No murmur heard. Pulmonary/Chest: Effort normal. No respiratory distress. She has wheezes. She has no rales.  Musculoskeletal:       Right  shoulder: She exhibits decreased range of motion, tenderness  and spasm.       Arms: Decreased ROM with turning neck to the right.  Full ROM when turning to the left.  She does have a palpable tightened area of muscle over the right upper trapezius area.   Lymphadenopathy:    She has no cervical adenopathy.  Neurological: She is alert and oriented to person, place, and time.  Skin: Skin is warm and dry.  Nursing note and vitals reviewed.     Assessment & Plan:  1. Acute non-recurrent pansinusitis Continue atrovent nasal spray, claritin and daily use of flonase nasal spray.   - Basic Metabolic Panel - Magnesium - doxycycline (VIBRAMYCIN) 100 MG capsule; Take 1 capsule (100 mg total) by mouth 2 (two) times daily.  Dispense: 14 capsule; Refill: 0  2. Tremor Will check labs and call with results. Consider increase in Primidone.    3. Neck pain Reorder PT for her neck.  Use heat pad to neck as needed for pain.  - Ambulatory referral to Physical Therapy

## 2017-10-03 ENCOUNTER — Encounter: Payer: Self-pay | Admitting: Adult Health

## 2017-10-05 ENCOUNTER — Encounter: Payer: Self-pay | Admitting: Adult Health

## 2017-10-10 ENCOUNTER — Other Ambulatory Visit: Payer: Self-pay | Admitting: Adult Health

## 2017-10-10 DIAGNOSIS — I10 Essential (primary) hypertension: Secondary | ICD-10-CM

## 2017-10-10 MED ORDER — BISOPROLOL FUMARATE 10 MG PO TABS: 10.0000 mg | ORAL_TABLET | Freq: Every day | ORAL | 1 refills | Status: DC

## 2017-10-16 ENCOUNTER — Inpatient Hospital Stay (HOSPITAL_COMMUNITY)
Admission: EM | Admit: 2017-10-16 | Discharge: 2017-10-22 | DRG: 291 | Disposition: A | Discharge status: Skilled Nursing Facility | Payer: Medicare Other | Attending: Family Medicine | Admitting: Family Medicine

## 2017-10-16 ENCOUNTER — Other Ambulatory Visit: Payer: Self-pay

## 2017-10-16 ENCOUNTER — Emergency Department (HOSPITAL_COMMUNITY): Payer: Medicare Other

## 2017-10-16 DIAGNOSIS — J189 Pneumonia, unspecified organism: Secondary | ICD-10-CM

## 2017-10-16 DIAGNOSIS — J302 Other seasonal allergic rhinitis: Secondary | ICD-10-CM | POA: Diagnosis present

## 2017-10-16 DIAGNOSIS — I509 Heart failure, unspecified: Secondary | ICD-10-CM

## 2017-10-16 DIAGNOSIS — J9601 Acute respiratory failure with hypoxia: Secondary | ICD-10-CM

## 2017-10-16 DIAGNOSIS — Z7902 Long term (current) use of antithrombotics/antiplatelets: Secondary | ICD-10-CM

## 2017-10-16 DIAGNOSIS — I110 Hypertensive heart disease with heart failure: Principal | ICD-10-CM | POA: Diagnosis present

## 2017-10-16 DIAGNOSIS — E876 Hypokalemia: Secondary | ICD-10-CM | POA: Diagnosis present

## 2017-10-16 DIAGNOSIS — Z888 Allergy status to other drugs, medicaments and biological substances status: Secondary | ICD-10-CM

## 2017-10-16 DIAGNOSIS — E875 Hyperkalemia: Secondary | ICD-10-CM | POA: Diagnosis present

## 2017-10-16 DIAGNOSIS — Z79899 Other long term (current) drug therapy: Secondary | ICD-10-CM

## 2017-10-16 DIAGNOSIS — J9600 Acute respiratory failure, unspecified whether with hypoxia or hypercapnia: Secondary | ICD-10-CM | POA: Diagnosis present

## 2017-10-16 DIAGNOSIS — R739 Hyperglycemia, unspecified: Secondary | ICD-10-CM | POA: Diagnosis present

## 2017-10-16 DIAGNOSIS — J810 Acute pulmonary edema: Secondary | ICD-10-CM

## 2017-10-16 DIAGNOSIS — Z86718 Personal history of other venous thrombosis and embolism: Secondary | ICD-10-CM

## 2017-10-16 DIAGNOSIS — I2722 Pulmonary hypertension due to left heart disease: Secondary | ICD-10-CM | POA: Diagnosis present

## 2017-10-16 DIAGNOSIS — Z8249 Family history of ischemic heart disease and other diseases of the circulatory system: Secondary | ICD-10-CM

## 2017-10-16 DIAGNOSIS — E872 Acidosis: Secondary | ICD-10-CM | POA: Diagnosis present

## 2017-10-16 DIAGNOSIS — Z881 Allergy status to other antibiotic agents status: Secondary | ICD-10-CM

## 2017-10-16 DIAGNOSIS — I081 Rheumatic disorders of both mitral and tricuspid valves: Secondary | ICD-10-CM | POA: Diagnosis present

## 2017-10-16 DIAGNOSIS — Z01818 Encounter for other preprocedural examination: Secondary | ICD-10-CM

## 2017-10-16 DIAGNOSIS — H9190 Unspecified hearing loss, unspecified ear: Secondary | ICD-10-CM | POA: Diagnosis present

## 2017-10-16 DIAGNOSIS — Z9049 Acquired absence of other specified parts of digestive tract: Secondary | ICD-10-CM

## 2017-10-16 DIAGNOSIS — Z85038 Personal history of other malignant neoplasm of large intestine: Secondary | ICD-10-CM

## 2017-10-16 DIAGNOSIS — Z91048 Other nonmedicinal substance allergy status: Secondary | ICD-10-CM

## 2017-10-16 DIAGNOSIS — Z96641 Presence of right artificial hip joint: Secondary | ICD-10-CM | POA: Diagnosis present

## 2017-10-16 DIAGNOSIS — Z90710 Acquired absence of both cervix and uterus: Secondary | ICD-10-CM

## 2017-10-16 DIAGNOSIS — K5900 Constipation, unspecified: Secondary | ICD-10-CM | POA: Diagnosis present

## 2017-10-16 DIAGNOSIS — A419 Sepsis, unspecified organism: Secondary | ICD-10-CM

## 2017-10-16 DIAGNOSIS — Z85828 Personal history of other malignant neoplasm of skin: Secondary | ICD-10-CM

## 2017-10-16 DIAGNOSIS — E785 Hyperlipidemia, unspecified: Secondary | ICD-10-CM | POA: Diagnosis present

## 2017-10-16 DIAGNOSIS — J9602 Acute respiratory failure with hypercapnia: Secondary | ICD-10-CM

## 2017-10-16 DIAGNOSIS — I5033 Acute on chronic diastolic (congestive) heart failure: Secondary | ICD-10-CM | POA: Diagnosis present

## 2017-10-16 DIAGNOSIS — K219 Gastro-esophageal reflux disease without esophagitis: Secondary | ICD-10-CM | POA: Diagnosis present

## 2017-10-16 DIAGNOSIS — Z8673 Personal history of transient ischemic attack (TIA), and cerebral infarction without residual deficits: Secondary | ICD-10-CM

## 2017-10-16 DIAGNOSIS — J9622 Acute and chronic respiratory failure with hypercapnia: Secondary | ICD-10-CM | POA: Diagnosis present

## 2017-10-16 DIAGNOSIS — J449 Chronic obstructive pulmonary disease, unspecified: Secondary | ICD-10-CM | POA: Diagnosis present

## 2017-10-16 DIAGNOSIS — Z7951 Long term (current) use of inhaled steroids: Secondary | ICD-10-CM

## 2017-10-16 DIAGNOSIS — J9621 Acute and chronic respiratory failure with hypoxia: Secondary | ICD-10-CM | POA: Diagnosis present

## 2017-10-16 DIAGNOSIS — D696 Thrombocytopenia, unspecified: Secondary | ICD-10-CM | POA: Diagnosis present

## 2017-10-16 DIAGNOSIS — Z87891 Personal history of nicotine dependence: Secondary | ICD-10-CM

## 2017-10-16 DIAGNOSIS — Z885 Allergy status to narcotic agent status: Secondary | ICD-10-CM

## 2017-10-16 DIAGNOSIS — Z87442 Personal history of urinary calculi: Secondary | ICD-10-CM

## 2017-10-16 DIAGNOSIS — Z7982 Long term (current) use of aspirin: Secondary | ICD-10-CM

## 2017-10-16 DIAGNOSIS — J9690 Respiratory failure, unspecified, unspecified whether with hypoxia or hypercapnia: Secondary | ICD-10-CM

## 2017-10-16 LAB — I-STAT ARTERIAL BLOOD GAS, ED
Acid-base deficit: 1 mmol/L (ref 0.0–2.0)
BICARBONATE: 29.3 mmol/L — AB (ref 20.0–28.0)
O2 Saturation: 99 %
PCO2 ART: 76.2 mmHg — AB (ref 32.0–48.0)
PH ART: 7.193 — AB (ref 7.350–7.450)
TCO2: 32 mmol/L (ref 22–32)
pO2, Arterial: 175 mmHg — ABNORMAL HIGH (ref 83.0–108.0)

## 2017-10-16 LAB — CBC WITH DIFFERENTIAL/PLATELET
Basophils Absolute: 0 10*3/uL (ref 0.0–0.1)
Basophils Relative: 0 %
Eosinophils Absolute: 0.2 10*3/uL (ref 0.0–0.7)
Eosinophils Relative: 1 %
HCT: 46.3 % — ABNORMAL HIGH (ref 36.0–46.0)
Hemoglobin: 14.6 g/dL (ref 12.0–15.0)
Lymphocytes Relative: 24 %
Lymphs Abs: 4.5 10*3/uL — ABNORMAL HIGH (ref 0.7–4.0)
MCH: 29 pg (ref 26.0–34.0)
MCHC: 31.5 g/dL (ref 30.0–36.0)
MCV: 92 fL (ref 78.0–100.0)
Monocytes Absolute: 2.1 10*3/uL — ABNORMAL HIGH (ref 0.1–1.0)
Monocytes Relative: 11 %
Neutro Abs: 11.9 10*3/uL — ABNORMAL HIGH (ref 1.7–7.7)
Neutrophils Relative %: 64 %
Platelets: 248 10*3/uL (ref 150–400)
RBC: 5.03 MIL/uL (ref 3.87–5.11)
RDW: 14 % (ref 11.5–15.5)
WBC: 18.7 10*3/uL — ABNORMAL HIGH (ref 4.0–10.5)

## 2017-10-16 LAB — I-STAT CG4 LACTIC ACID, ED: LACTIC ACID, VENOUS: 2.61 mmol/L — AB (ref 0.5–1.9)

## 2017-10-16 LAB — I-STAT TROPONIN, ED: Troponin i, poc: 0.04 ng/mL (ref 0.00–0.08)

## 2017-10-16 LAB — COMPREHENSIVE METABOLIC PANEL
ALT: 26 U/L (ref 14–54)
ANION GAP: 11 (ref 5–15)
AST: 45 U/L — ABNORMAL HIGH (ref 15–41)
Albumin: 4 g/dL (ref 3.5–5.0)
Alkaline Phosphatase: 89 U/L (ref 38–126)
BUN: 21 mg/dL — ABNORMAL HIGH (ref 6–20)
CHLORIDE: 94 mmol/L — AB (ref 101–111)
CO2: 26 mmol/L (ref 22–32)
Calcium: 9.2 mg/dL (ref 8.9–10.3)
Creatinine, Ser: 0.9 mg/dL (ref 0.44–1.00)
GFR, EST NON AFRICAN AMERICAN: 53 mL/min — AB (ref >60)
Glucose, Bld: 205 mg/dL — ABNORMAL HIGH (ref 65–99)
POTASSIUM: 5 mmol/L (ref 3.5–5.1)
Sodium: 131 mmol/L — ABNORMAL LOW (ref 135–145)
Total Bilirubin: 0.7 mg/dL (ref 0.3–1.2)
Total Protein: 7.4 g/dL (ref 6.5–8.1)

## 2017-10-16 MED ORDER — SODIUM CHLORIDE 0.9 % IV SOLN: 2.0000 g | Freq: Once | INTRAVENOUS | Status: DC

## 2017-10-16 MED ORDER — ALBUTEROL SULFATE (2.5 MG/3ML) 0.083% IN NEBU
5.0000 mg | INHALATION_SOLUTION | Freq: Once | RESPIRATORY_TRACT | Status: AC
  Administered 2017-10-16: 5 mg via RESPIRATORY_TRACT
  Filled 2017-10-16: qty 6

## 2017-10-16 MED ORDER — PROPOFOL 1000 MG/100ML IV EMUL
5.0000 ug/kg/min | Freq: Once | INTRAVENOUS | Status: AC
  Administered 2017-10-16: 10 ug/kg/min via INTRAVENOUS
  Filled 2017-10-16: qty 100

## 2017-10-16 MED ORDER — ETOMIDATE 2 MG/ML IV SOLN
INTRAVENOUS | Status: AC | PRN
  Administered 2017-10-16: 20 mg via INTRAVENOUS

## 2017-10-16 MED ORDER — SUCCINYLCHOLINE CHLORIDE 20 MG/ML IJ SOLN
INTRAMUSCULAR | Status: AC | PRN
  Administered 2017-10-16: 100 mg via INTRAVENOUS

## 2017-10-16 MED ORDER — PROPOFOL 1000 MG/100ML IV EMUL
INTRAVENOUS | Status: AC
  Filled 2017-10-16: qty 100

## 2017-10-16 MED ORDER — SODIUM CHLORIDE 0.9 % IV SOLN
1.0000 g | INTRAVENOUS | Status: DC
  Administered 2017-10-17: 1 g via INTRAVENOUS
  Filled 2017-10-16: qty 1

## 2017-10-16 MED ORDER — SODIUM CHLORIDE 0.9 % IV SOLN: 500.0000 mg | INTRAVENOUS | Status: DC

## 2017-10-16 MED ORDER — IPRATROPIUM-ALBUTEROL 0.5-2.5 (3) MG/3ML IN SOLN
3.0000 mL | Freq: Once | RESPIRATORY_TRACT | Status: AC
  Administered 2017-10-16: 3 mL via RESPIRATORY_TRACT

## 2017-10-16 MED ORDER — SODIUM CHLORIDE 0.9 % IV BOLUS (SEPSIS)
1000.0000 mL | Freq: Once | INTRAVENOUS | Status: AC
Start: 2017-10-16 — End: 2017-10-16
  Administered 2017-10-16: 1000 mL via INTRAVENOUS

## 2017-10-16 MED ORDER — VANCOMYCIN HCL IN DEXTROSE 1-5 GM/200ML-% IV SOLN
1000.0000 mg | Freq: Once | INTRAVENOUS | Status: AC
  Administered 2017-10-16: 1000 mg via INTRAVENOUS
  Filled 2017-10-16: qty 200

## 2017-10-16 NOTE — Progress Notes (Signed)
Pharmacy Antibiotic Note  [name] is a 84 y.o. female admitted on 10/16/2017 with sepsis.  Pharmacy has been consulted for vancomycin and cefepime dosing.  SCr 0.9, CrCl ~56mL/min due to patient's small size, which may still be an overestimation of true renal function.  Plan: Vancomycin 1g IV x1, then 500mg  IV q24h. Goal trough 15-22mcg/mL. Cefepime 1g IV q24h Follow c/s, clinical progression, renal function, LOT/de-escalation, level PRN  Height: 5\' 4"  (162.6 cm) Weight: 129 lb (58.5 kg) IBW/kg (Calculated) : 54.7  Temp (24hrs), Avg:99.2 F (37.3 C), Min:99.2 F (37.3 C), Max:99.2 F (37.3 C)  Recent Labs  Lab 10/16/17 2150 10/16/17 2236  WBC 18.7*  --   CREATININE 0.90  --   LATICACIDVEN  --  2.61*    Estimated Creatinine Clearance: 33 mL/min (by C-G formula based on SCr of 0.9 mg/dL).    Allergies  Allergen Reactions  . Prochlorperazine Other (See Comments)    tremor  . Celecoxib Nausea And Vomiting and Other (See Comments)    Body aches  . Codeine Nausea And Vomiting    Tolerates hydrocodone  . Fish Oil Nausea And Vomiting  . Fosamax [Alendronate Sodium] Nausea And Vomiting  . Morphine And Related Nausea And Vomiting  . Moxifloxacin Other (See Comments)    fatigue  . Oxycodone Nausea And Vomiting    Tolerates hydrocodone  . Pregabalin Swelling  . Propoxyphene Nausea And Vomiting  . Statins Other (See Comments)    Muscle pain  . Aspirin-Dipyridamole Er Other (See Comments)    Headcache  . Buprenorphine Hcl Nausea And Vomiting   Vancomycin 3/7>> Cefepime 3/7>>  3/7 BCx:  Thank you for allowing pharmacy to be a part of this patient's care.  [name] D. [name], PharmD, [place] Clinical Pharmacist [phone] 10/16/2017 10:49 PM

## 2017-10-16 NOTE — ED Triage Notes (Signed)
Pt to ED via EMS from [facility] for SOb x2 days. Pt endorses nause x 2 days with dry heaves and bile only. Pt A/O x4, denies pain. Received 10 albuterol, 0.5 atrovent, 125 solumedrol in route. 90% on 8 L aerosol mask

## 2017-10-16 NOTE — ED Provider Notes (Signed)
[place] 67[facility] MEDICAL ICU Provider Note   CSN: [id number] Arrival date & time: 10/16/17  2130     History   Chief Complaint Chief Complaint  Patient presents with  . Shortness of Breath    HPI [name] is a 84 y.o. female.  HPI  84 year old female history of hypertension, hyperlipidemia, diabetes, TIA, asthma COPD presents to the emergency department via EMS from her nursing home facility/after having 48 hours of worsening shortness of breath/dyspnea.  EMS gave the patient DuoNeb, steroids and had the patient on nonrebreather with marginal improvement with oxygen saturations at 75%.  Patient was initially alert and oriented x4 upon arrival then quickly had a change in mental status with worsening dyspnea/shortness of breath.  Past Medical History:  Diagnosis Date  . Anemia    iron deficiency  . Arthritis   . Asthma   . Cataract   . Chronic respiratory failure with hypoxia ([place])   . Colitis   . Colon cancer ([facility])    basal cell nose  . Diabetes mellitus without complication ([place])    NO meds ever [place] related to steroid use. Has more problems eith hypoglycemia  . Diverticulitis   . DVT (deep venous thrombosis) ([place])   . Essential hypertension   . Family history of adverse reaction to anesthesia    both daughters have problems getting bp up after anesthesia  . GERD (gastroesophageal reflux disease)   . Hearing impaired   . Hiatal hernia    neuropathy  . History of kidney stones   . Hyperlipidemia   . Hypertension   . Pneumonia   . PONV (postoperative nausea and vomiting)    BP drops really low cant breathe well vomiting  . Seasonal allergies   . TIA (transient ischemic attack)    x2 2 years ago    Patient Active Problem List   Diagnosis Date Noted  . Acute respiratory failure ([place]) 10/17/2017  . Status post total replacement of right hip 07/11/2017  . Pain of right hip joint 05/05/2017  . DOE (dyspnea on exertion) 02/21/2017  . Asthma,  chronic, moderate persistent, uncomplicated 15/17/6160  . Chronic respiratory failure with hypoxia and hypercapnia ([place]) 02/21/2017  . Hypertensive urgency 01/30/2017  . Dizziness 01/30/2017  . Hyponatremia with extracellular fluid depletion 01/30/2017  . Hyponatremia 01/30/2017  . Lumbar spondylosis 01/28/2017  . Unilateral primary osteoarthritis, right hip 01/28/2017  . Arthritis   . Asthma   . Cataract   . Colon cancer ([place])   . COPD (chronic obstructive pulmonary disease) ([place])   . Diabetes mellitus without complication ([place])   . Diverticulitis   . DVT (deep vein thrombosis) in pregnancy ([place])   . Essential hypertension   . GERD (gastroesophageal reflux disease)   . Hearing impaired   . Hyperlipidemia   . Hypertension   . TIA (transient ischemic attack)   . Seasonal allergies     Past Surgical History:  Procedure Laterality Date  . APPENDECTOMY    . CHOLECYSTECTOMY    . COLON RESECTION  1967  . EYE SURGERY     cataract  . JOINT REPLACEMENT     Right hip Dr. Ninfa Linden 07/11/17  . KIDNEY SURGERY  1966   cyst  . TONSILLECTOMY    . TOTAL ABDOMINAL HYSTERECTOMY    . TOTAL HIP ARTHROPLASTY Right 07/11/2017   Procedure: RIGHT TOTAL HIP ARTHROPLASTY ANTERIOR APPROACH;  Surgeon: Mcarthur Rossetti, MD;  Location: [facility];  Service: Orthopedics;  Laterality:  Right;    OB History    No data available       Home Medications    Prior to Admission medications   Medication Sig Start Date End Date Taking? Authorizing Provider  acetaminophen (TYLENOL) 500 MG tablet Take 500 mg by mouth daily.   Yes [provider]  albuterol (PROVENTIL HFA;VENTOLIN HFA) 108 (90 BASE) MCG/ACT inhaler Inhale 1 puff into the lungs every 4 (four) hours as needed for wheezing or shortness of breath.  01/17/14 06/30/18 Yes [provider]  aspirin EC 81 MG tablet Take 81 mg by mouth 2 (two) times a week. on Sunday and Thursday only. 09/01/12  Yes [provider]    bisoprolol (ZEBETA) 10 MG tablet Take 1 tablet (10 mg total) by mouth daily. Patient taking differently: Take 20 mg by mouth daily.  10/10/17  Yes Nafziger, Tommi Rumps, NP  Calcium Carbonate-Vit D-Min (CALCIUM 600+D PLUS MINERALS) 600-400 MG-UNIT CHEW Chew 2 tablets every morning by mouth.    Yes [provider]  Cholecalciferol (VITAMIN D3) 10000 units TABS Take 10,000 Units 4 (four) times a week by mouth. Monday, Tuesday, Wednesday, & Thursday   Yes [provider]  clopidogrel (PLAVIX) 75 MG tablet Take 1 tablet (75 mg total) by mouth daily. 01/28/17  Yes Nafziger, Tommi Rumps, NP  dexlansoprazole (DEXILANT) 60 MG capsule TAKE 1 CAPSULE BY MOUTH EVERY MORNING BEFORE BREAKFAST. 08/29/17  Yes Nafziger, Tommi Rumps, NP  dimenhyDRINATE (DRAMAMINE) 50 MG tablet Take 50 mg every 8 (eight) hours as needed by mouth (for dizziness/vertigo).    Yes [provider]  ferrous sulfate 325 (65 FE) MG tablet Take 325 mg every Monday, Wednesday, and Friday by mouth. In the morning with breakfast. 10/12/14  Yes [provider]  fluticasone (FLONASE) 50 MCG/ACT nasal spray Place 1 spray 2 (two) times daily as needed into both nostrils (for allergies.).  11/01/14  Yes [provider]  fluticasone furoate-vilanterol (BREO ELLIPTA) 100-25 MCG/INH AEPB Inhale 1 puff into the lungs daily. 02/21/17  Yes Tanda Rockers, MD  gabapentin (NEURONTIN) 100 MG capsule Take 1 capsule (100 mg total) by mouth daily. 09/11/17  Yes Laurey Morale, MD  HYDROcodone-acetaminophen (NORCO/VICODIN) 5-325 MG tablet Take 1 to 1/2 tab at bedtime as needed Patient taking differently: Take 1 tablet by mouth at bedtime.  09/18/17  Yes Nafziger, Tommi Rumps, NP  ipratropium (ATROVENT) 0.03 % nasal spray Place 1 spray into both nostrils at bedtime.    Yes [provider]  loratadine (CLARITIN) 10 MG tablet Take 10 mg by mouth daily.  09/01/12  Yes [provider]  NASAL SPRAY SALINE NA Place 1 spray 4 (four) times daily as  needed into both nostrils (congestion).    Yes [provider]  ondansetron (ZOFRAN) 4 MG tablet Take 2 mg 2 (two) times daily by mouth. In the morning & with hydrocodone at bedtime.   Yes [provider]  Polyethyl Glycol-Propyl Glycol (LUBRICANT EYE DROPS) 0.4-0.3 % SOLN Apply 1 drop to eye 4 (four) times daily.    Yes [provider]  polyethylene glycol powder (GLYCOLAX/MIRALAX) powder Take 17 g at bedtime by mouth.   Yes [provider]  primidone (MYSOLINE) 50 MG tablet TAKE 1/2 TABLET BY MOUTH TWICE A DAY Patient taking differently: Take one tablet ( 50 mg) in the morning and 1/2 tab in the afternoon 01/29/17  Yes Nafziger, Tommi Rumps, NP  Probiotic Product (PROBIOTIC PO) Take 1 capsule daily with lunch by mouth.  Yes [provider]  Psyllium (METAMUCIL FIBER PO) Take 1 scoop by mouth daily.    Yes [provider]  TOBRADEX ophthalmic ointment APPLY IN BOTH EYES AT BEDTIME 09/11/17  Yes [provider]  tobramycin-dexamethasone (TOBRADEX) ophthalmic solution INSTILL 1 DROP INTO BOTH EYES 4 TIMES A DAY 09/11/17  Yes [provider]  doxycycline (VIBRAMYCIN) 100 MG capsule Take 1 capsule (100 mg total) by mouth 2 (two) times daily. Patient not taking: Reported on 10/16/2017 10/02/17   [name], NP    Family History Family History  Problem Relation Age of Onset  . Breast cancer Mother   . Heart attack Mother   . Emphysema Sister        smoked  . Prostate cancer Brother   . Heart disease Brother     Social History Social History   Tobacco Use  . Smoking status: Never Smoker  . Smokeless tobacco: Never Used  Substance Use Topics  . Alcohol use: No  . Drug use: No     Allergies   Prochlorperazine; Celecoxib; Codeine; Fish oil; Fosamax [alendronate sodium]; Morphine and related; Moxifloxacin; Oxycodone; Pregabalin; Propoxyphene; Statins; Aspirin-dipyridamole er; and Buprenorphine hcl   Review of  Systems Review of Systems  Unable to perform ROS: Acuity of condition     Physical Exam Updated Vital Signs BP (!) 117/52   Pulse 88   Temp 98.6 F (37 C) (Oral)   Resp 12   Ht '5\' 4"'  (1.626 m)   Wt 62.8 kg (138 lb 7.2 oz)   SpO2 99%   BMI 23.76 kg/m   Physical Exam  Physical Exam Vitals:   10/17/17 0900 10/17/17 1000  BP: (!) 115/51 (!) 117/52  Pulse: 98 88  Resp: 12 12  Temp:    SpO2: 97% 99%   Constitutional: Patient is in severe acute distress Head: Normocephalic and atraumatic.  Eyes: Extraocular motion intact, no scleral icterus Neck: Supple without meningismus, mass, or overt JVD Respiratory: diminished bilateral/slight wheeze severe respiratory distress. CV: tachcardia,  no obvious murmurs.  Pulses +2 and symmetric Abdomen: Soft, non-tender, non-distended MSK: Extremities are atraumatic without deformity, ROM intact Skin: Warm, dry, intact Neuro: GCS 3   ED Treatments / Results  Labs (all labs ordered are listed, but only abnormal results are displayed) Labs Reviewed  COMPREHENSIVE METABOLIC PANEL - Abnormal; Notable for the following components:      Result Value   Sodium 131 (*)    Chloride 94 (*)    Glucose, Bld 205 (*)    BUN 21 (*)    AST 45 (*)    GFR calc non Af Amer 53 (*)    All other components within normal limits  CBC WITH DIFFERENTIAL/PLATELET - Abnormal; Notable for the following components:   WBC 18.7 (*)    HCT 46.3 (*)    Neutro Abs 11.9 (*)    Lymphs Abs 4.5 (*)    Monocytes Absolute 2.1 (*)    All other components within normal limits  BRAIN NATRIURETIC PEPTIDE - Abnormal; Notable for the following components:   B Natriuretic Peptide 665.0 (*)    All other components within normal limits  LACTIC ACID, PLASMA - Abnormal; Notable for the following components:   Lactic Acid, Venous 4.6 (*)    All other components within normal limits  URINALYSIS, ROUTINE W REFLEX MICROSCOPIC - Abnormal; Notable for the following components:    APPearance CLOUDY (*)    Glucose, UA 50 (*)    Protein, ur  100 (*)    Bacteria, UA FEW (*)    Squamous Epithelial / LPF 0-5 (*)    All other components within normal limits  CBC - Abnormal; Notable for the following components:   WBC 14.7 (*)    All other components within normal limits  BASIC METABOLIC PANEL - Abnormal; Notable for the following components:   Potassium 3.3 (*)    CO2 21 (*)    Glucose, Bld 239 (*)    Calcium 8.0 (*)    GFR calc non Af Amer 48 (*)    GFR calc Af Amer 56 (*)    All other components within normal limits  BLOOD GAS, ARTERIAL - Abnormal; Notable for the following components:   pH, Arterial 7.306 (*)    Acid-base deficit 3.3 (*)    All other components within normal limits  MAGNESIUM - Abnormal; Notable for the following components:   Magnesium 1.6 (*)    All other components within normal limits  GLUCOSE, CAPILLARY - Abnormal; Notable for the following components:   Glucose-Capillary 220 (*)    All other components within normal limits  LACTIC ACID, PLASMA - Abnormal; Notable for the following components:   Lactic Acid, Venous 7.2 (*)    All other components within normal limits  LACTIC ACID, PLASMA - Abnormal; Notable for the following components:   Lactic Acid, Venous 5.5 (*)    All other components within normal limits  GLUCOSE, CAPILLARY - Abnormal; Notable for the following components:   Glucose-Capillary 177 (*)    All other components within normal limits  I-STAT CG4 LACTIC ACID, ED - Abnormal; Notable for the following components:   Lactic Acid, Venous 2.61 (*)    All other components within normal limits  I-STAT ARTERIAL BLOOD GAS, ED - Abnormal; Notable for the following components:   pH, Arterial 7.193 (*)    pCO2 arterial 76.2 (*)    pO2, Arterial 175.0 (*)    Bicarbonate 29.3 (*)    All other components within normal limits  I-STAT ARTERIAL BLOOD GAS, ED - Abnormal; Notable for the following components:   pH, Arterial 7.310 (*)     pCO2 arterial 53.1 (*)    pO2, Arterial 80.0 (*)    All other components within normal limits  CBG MONITORING, ED - Abnormal; Notable for the following components:   Glucose-Capillary 227 (*)    All other components within normal limits  POCT I-STAT 3, ART BLOOD GAS (G3+) - Abnormal; Notable for the following components:   pH, Arterial 7.293 (*)    pO2, Arterial 130.0 (*)    Acid-base deficit 6.0 (*)    All other components within normal limits  RESPIRATORY PANEL BY PCR  MRSA PCR SCREENING  CULTURE, BLOOD (ROUTINE X 2)  CULTURE, BLOOD (ROUTINE X 2)  CULTURE, RESPIRATORY (NON-EXPECTORATED)  INFLUENZA PANEL BY PCR (TYPE A & B)  PROCALCITONIN  PHOSPHORUS  LACTIC ACID, PLASMA  LACTIC ACID, PLASMA  BRAIN NATRIURETIC PEPTIDE  I-STAT TROPONIN, ED  I-STAT CG4 LACTIC ACID, ED  I-STAT TROPONIN, ED  I-STAT TROPONIN, ED    EKG  EKG Interpretation  Date/Time:  Thursday October 16 2017 22:11:06 EST Ventricular Rate:  103 PR Interval:    QRS Duration: 94 QT Interval:  346 QTC Calculation: 453 R Axis:   80 Text Interpretation:  Sinus tachycardia Consider right atrial enlargement Anteroseptal infarct, old No acute changes Nonspecific ST and T wave abnormality Confirmed by Varney Biles [phone]) on 10/16/2017 10:13:40 PM Also confirmed  by Varney Biles [phone]), editor Hattie Perch [phone])  on 10/17/2017 6:56:43 AM       Radiology Dg Chest Port 1 View  Result Date: 10/16/2017 CLINICAL DATA:  Initial evaluation for intubation. EXAM: PORTABLE CHEST 1 VIEW COMPARISON:  Prior radiograph from 01/31/2017. FINDINGS: Endotracheal tube in place with tip well positioned 2.7 cm above the carina. Tip of an apparent additional enteric tube positioned above the thoracic inlet, which may be in the esophagus or airway. Stable cardiomegaly. Mediastinal silhouette normal. Aortic atherosclerosis. Failing opacities overlying the bilateral hemidiaphragms, consistent with effusions. Diffuse vascular  congestion with interstitial prominence, consistent with moderate diffuse pulmonary edema. Superimposed bibasilar opacities favored to reflect atelectasis and/or edema. Superimposed infiltrates would be difficult to exclude. No pneumothorax. No acute osseous abnormality. IMPRESSION: 1. Endotracheal tube in place with tip well positioned 2.7 cm above the carina. 2. Tip of enteric tube positioned above the thoracic inlet. This may lie within the trachea or esophagus. Repositioning recommended. 3. Cardiomegaly with moderate diffuse pulmonary edema and bilateral pleural effusions, suggesting CHF. Superimposed bibasilar opacities favored to reflect atelectasis/edema. Infiltrates could be considered in the correct clinical setting. Electronically Signed   By: Jeannine Boga M.D.   On: 10/16/2017 22:47   Dg Abd Portable 1 View  Result Date: 10/17/2017 CLINICAL DATA:  OG tube placement. EXAM: PORTABLE ABDOMEN - 1 VIEW COMPARISON:  None. FINDINGS: Tip and side-port of the enteric tube below the diaphragm in the stomach. No bowel obstruction in the visualized upper abdomen. Bibasilar opacities and perihilar edema, as seen on recent chest radiograph. IMPRESSION: Tip and side port of the enteric tube in the stomach Electronically Signed   By: Jeb Levering M.D.   On: 10/17/2017 01:35    Procedures Procedures (including critical care time)  Medications Ordered in ED Medications  ceFEPIme (MAXIPIME) 1 g in sodium chloride 0.9 % 100 mL IVPB (0 g Intravenous Stopped 10/17/17 0109)  vancomycin (VANCOCIN) 500 mg in sodium chloride 0.9 % 100 mL IVPB (not administered)  ipratropium-albuterol (DUONEB) 0.5-2.5 (3) MG/3ML nebulizer solution 3 mL (3 mLs Nebulization Given 10/17/17 0803)  budesonide (PULMICORT) nebulizer solution 0.5 mg (0.5 mg Nebulization Given 10/17/17 0810)  arformoterol (BROVANA) nebulizer solution 15 mcg (15 mcg Nebulization Given 10/17/17 0813)  insulin aspart (novoLOG) injection 2-6 Units (4 Units  Subcutaneous Given 10/17/17 0822)  heparin injection 5,000 Units (5,000 Units Subcutaneous Given 10/17/17 0606)  acetaminophen (TYLENOL) tablet 650 mg (not administered)  ondansetron (ZOFRAN) injection 4 mg (not administered)  chlorhexidine gluconate (MEDLINE KIT) (PERIDEX) 0.12 % solution 15 mL (15 mLs Mouth Rinse Given 10/17/17 0823)  MEDLINE mouth rinse (15 mLs Mouth Rinse Given 10/17/17 0400)  fentaNYL 257mg in NS 2560m(1028mml) infusion-PREMIX (25 mcg/hr Intravenous Rate/Dose Change 10/17/17 0828)  fentaNYL (SUBLIMAZE) bolus via infusion 25 mcg (not administered)  midazolam (VERSED) injection 1 mg (not administered)  midazolam (VERSED) injection 1 mg (not administered)  docusate (COLACE) 50 MG/5ML liquid 100 mg (100 mg Per Tube Given 10/17/17 0910)  famotidine (PEPCID) 40 MG/5ML suspension 20 mg (20 mg Per Tube Given 10/17/17 0910)  magnesium sulfate IVPB 2 g 50 mL (2 g Intravenous New Bag/Given 10/17/17 1000)  potassium PHOSPHATE 20 mmol in dextrose 5 % 500 mL infusion (not administered)  0.9 %  sodium chloride infusion (250 mLs Intravenous New Bag/Given 10/17/17 0959)  albuterol (PROVENTIL) (2.5 MG/3ML) 0.083% nebulizer solution 5 mg (5 mg Nebulization Given 10/16/17 2240)  etomidate (AMIDATE) injection (20 mg Intravenous Given 10/16/17 2159)  succinylcholine (ANECTINE) injection (  100 mg Intravenous Given 10/16/17 2200)  sodium chloride 0.9 % bolus 1,000 mL (0 mLs Intravenous Stopped 10/16/17 2232)  propofol (DIPRIVAN) 1000 MG/100ML infusion (0 mcg/kg/min  58.5 kg Intravenous Stopped 10/17/17 0406)  vancomycin (VANCOCIN) IVPB 1000 mg/200 mL premix (0 mg Intravenous Stopped 10/16/17 2356)  ipratropium-albuterol (DUONEB) 0.5-2.5 (3) MG/3ML nebulizer solution 3 mL (3 mLs Nebulization Given 10/16/17 2245)  sodium chloride 0.9 % bolus 1,000 mL (0 mLs Intravenous Stopped 10/17/17 0202)    And  sodium chloride 0.9 % bolus 1,000 mL (1,000 mLs Intravenous Transfusing/Transfer 10/17/17 0247)  fentaNYL (SUBLIMAZE) injection  50 mcg (50 mcg Intravenous Given 10/17/17 0133)  potassium chloride 20 MEQ/15ML (10%) solution 40 mEq (40 mEq Oral Given 10/17/17 1009)  sodium chloride 0.9 % bolus 250 mL (250 mLs Intravenous New Bag/Given 10/17/17 0958)     Initial Impression / Assessment and Plan / ED Course  I have reviewed the triage vital signs and the nursing notes.  Pertinent labs & imaging results that were available during my care of the patient were reviewed by me and considered in my medical decision making (see chart for details).     84 year old female history of hypertension, hyperlipidemia, diabetes, TIA, asthma COPD presents to the emergency department via EMS from her nursing home facility/after having 48 hours of worsening shortness of breath/dyspnea.  EMS gave the patient DuoNeb, steroids and had the patient on nonrebreather with marginal improvement with oxygen saturations at 75%.  Patient was initially alert and oriented x4 upon arrival then quickly had a change in mental status with worsening dyspnea/shortness of breath.  Patient was in acute respiratory distress upon arrival in the room/unresponsive.  Spoke with the son stated patient is full code wanted intubation/CPR/defibrillation if needed.  Patient was intubated without complication in the emergency department and code sepsis initiated.  Review of patient's lab shows 18,000 leukocytosis, hemoglobin stable at 14.6, good renal function, normal LFTs, elevated lactic acid 2.61, fluid resuscitation in ED along with empiric abx.  Findings concerning with chest x-ray with findings concerning for pneumonia and component of heart failure.   Post intubation x-ray with proper tube placement.    Plan admit ICU for  hypoxic respiratory failure sepsis/pneumonia with possible cardiac component/CHF.  Final Clinical Impressions(s) / ED Diagnoses   Final diagnoses:  Acute respiratory failure with hypoxia ([place])  Healthcare-associated pneumonia  Sepsis, due to unspecified  organism ([place])  Acute hypercapnic respiratory failure ([place])  Acute pulmonary edema [facility])    ED Discharge Orders    None       Willette Alma, DO 10/17/17 [place], MD 10/18/17 1402

## 2017-10-16 NOTE — ED Notes (Signed)
ED Provider at bedside. 

## 2017-10-17 ENCOUNTER — Inpatient Hospital Stay (HOSPITAL_COMMUNITY): Payer: Medicare Other

## 2017-10-17 ENCOUNTER — Emergency Department (HOSPITAL_COMMUNITY): Payer: Medicare Other

## 2017-10-17 DIAGNOSIS — D696 Thrombocytopenia, unspecified: Secondary | ICD-10-CM | POA: Diagnosis present

## 2017-10-17 DIAGNOSIS — R6520 Severe sepsis without septic shock: Secondary | ICD-10-CM | POA: Diagnosis not present

## 2017-10-17 DIAGNOSIS — Z9049 Acquired absence of other specified parts of digestive tract: Secondary | ICD-10-CM | POA: Diagnosis not present

## 2017-10-17 DIAGNOSIS — J189 Pneumonia, unspecified organism: Secondary | ICD-10-CM

## 2017-10-17 DIAGNOSIS — I340 Nonrheumatic mitral (valve) insufficiency: Secondary | ICD-10-CM

## 2017-10-17 DIAGNOSIS — Z96641 Presence of right artificial hip joint: Secondary | ICD-10-CM | POA: Diagnosis present

## 2017-10-17 DIAGNOSIS — J302 Other seasonal allergic rhinitis: Secondary | ICD-10-CM | POA: Diagnosis present

## 2017-10-17 DIAGNOSIS — I081 Rheumatic disorders of both mitral and tricuspid valves: Secondary | ICD-10-CM | POA: Diagnosis present

## 2017-10-17 DIAGNOSIS — J9600 Acute respiratory failure, unspecified whether with hypoxia or hypercapnia: Secondary | ICD-10-CM | POA: Diagnosis present

## 2017-10-17 DIAGNOSIS — R739 Hyperglycemia, unspecified: Secondary | ICD-10-CM | POA: Diagnosis present

## 2017-10-17 DIAGNOSIS — I361 Nonrheumatic tricuspid (valve) insufficiency: Secondary | ICD-10-CM | POA: Diagnosis not present

## 2017-10-17 DIAGNOSIS — J9601 Acute respiratory failure with hypoxia: Secondary | ICD-10-CM

## 2017-10-17 DIAGNOSIS — Z8673 Personal history of transient ischemic attack (TIA), and cerebral infarction without residual deficits: Secondary | ICD-10-CM | POA: Diagnosis not present

## 2017-10-17 DIAGNOSIS — A419 Sepsis, unspecified organism: Secondary | ICD-10-CM

## 2017-10-17 DIAGNOSIS — J9602 Acute respiratory failure with hypercapnia: Secondary | ICD-10-CM | POA: Diagnosis not present

## 2017-10-17 DIAGNOSIS — E875 Hyperkalemia: Secondary | ICD-10-CM | POA: Diagnosis present

## 2017-10-17 DIAGNOSIS — Z7951 Long term (current) use of inhaled steroids: Secondary | ICD-10-CM | POA: Diagnosis not present

## 2017-10-17 DIAGNOSIS — J9622 Acute and chronic respiratory failure with hypercapnia: Secondary | ICD-10-CM

## 2017-10-17 DIAGNOSIS — Z7902 Long term (current) use of antithrombotics/antiplatelets: Secondary | ICD-10-CM | POA: Diagnosis not present

## 2017-10-17 DIAGNOSIS — E872 Acidosis: Secondary | ICD-10-CM | POA: Diagnosis present

## 2017-10-17 DIAGNOSIS — I5033 Acute on chronic diastolic (congestive) heart failure: Secondary | ICD-10-CM | POA: Diagnosis present

## 2017-10-17 DIAGNOSIS — J9621 Acute and chronic respiratory failure with hypoxia: Secondary | ICD-10-CM | POA: Diagnosis present

## 2017-10-17 DIAGNOSIS — J810 Acute pulmonary edema: Secondary | ICD-10-CM | POA: Diagnosis present

## 2017-10-17 DIAGNOSIS — J449 Chronic obstructive pulmonary disease, unspecified: Secondary | ICD-10-CM | POA: Diagnosis present

## 2017-10-17 DIAGNOSIS — E785 Hyperlipidemia, unspecified: Secondary | ICD-10-CM | POA: Diagnosis present

## 2017-10-17 DIAGNOSIS — E876 Hypokalemia: Secondary | ICD-10-CM | POA: Diagnosis present

## 2017-10-17 DIAGNOSIS — I110 Hypertensive heart disease with heart failure: Secondary | ICD-10-CM | POA: Diagnosis present

## 2017-10-17 DIAGNOSIS — K219 Gastro-esophageal reflux disease without esophagitis: Secondary | ICD-10-CM | POA: Diagnosis present

## 2017-10-17 DIAGNOSIS — K5900 Constipation, unspecified: Secondary | ICD-10-CM | POA: Diagnosis present

## 2017-10-17 DIAGNOSIS — Z85038 Personal history of other malignant neoplasm of large intestine: Secondary | ICD-10-CM | POA: Diagnosis not present

## 2017-10-17 DIAGNOSIS — H9190 Unspecified hearing loss, unspecified ear: Secondary | ICD-10-CM | POA: Diagnosis present

## 2017-10-17 DIAGNOSIS — I2722 Pulmonary hypertension due to left heart disease: Secondary | ICD-10-CM | POA: Diagnosis present

## 2017-10-17 DIAGNOSIS — I509 Heart failure, unspecified: Secondary | ICD-10-CM | POA: Diagnosis not present

## 2017-10-17 LAB — BLOOD GAS, ARTERIAL
ACID-BASE DEFICIT: 3.3 mmol/L — AB (ref 0.0–2.0)
BICARBONATE: 22.1 mmol/L (ref 20.0–28.0)
Drawn by: 36496
FIO2: 60
LHR: 20 {breaths}/min
O2 Saturation: 96 %
PATIENT TEMPERATURE: 98.6
PEEP/CPAP: 5 cmH2O
VT: 440 mL
pCO2 arterial: 45.5 mmHg (ref 32.0–48.0)
pH, Arterial: 7.306 — ABNORMAL LOW (ref 7.350–7.450)
pO2, Arterial: 91.1 mmHg (ref 83.0–108.0)

## 2017-10-17 LAB — MAGNESIUM
Magnesium: 1.6 mg/dL — ABNORMAL LOW (ref 1.7–2.4)
Magnesium: 2.1 mg/dL (ref 1.7–2.4)

## 2017-10-17 LAB — URINALYSIS, ROUTINE W REFLEX MICROSCOPIC
Bilirubin Urine: NEGATIVE
GLUCOSE, UA: 50 mg/dL — AB
HGB URINE DIPSTICK: NEGATIVE
KETONES UR: NEGATIVE mg/dL
Leukocytes, UA: NEGATIVE
NITRITE: NEGATIVE
PROTEIN: 100 mg/dL — AB
Specific Gravity, Urine: 1.021 (ref 1.005–1.030)
pH: 5 (ref 5.0–8.0)

## 2017-10-17 LAB — POCT I-STAT 3, ART BLOOD GAS (G3+)
ACID-BASE DEFICIT: 6 mmol/L — AB (ref 0.0–2.0)
BICARBONATE: 20.7 mmol/L (ref 20.0–28.0)
O2 SAT: 99 %
TCO2: 22 mmol/L (ref 22–32)
pCO2 arterial: 42.9 mmHg (ref 32.0–48.0)
pH, Arterial: 7.293 — ABNORMAL LOW (ref 7.350–7.450)
pO2, Arterial: 130 mmHg — ABNORMAL HIGH (ref 83.0–108.0)

## 2017-10-17 LAB — INFLUENZA PANEL BY PCR (TYPE A & B)
Influenza A By PCR: NEGATIVE
Influenza B By PCR: NEGATIVE

## 2017-10-17 LAB — BASIC METABOLIC PANEL
ANION GAP: 14 (ref 5–15)
Anion gap: 7 (ref 5–15)
BUN: 16 mg/dL (ref 6–20)
BUN: 16 mg/dL (ref 6–20)
CALCIUM: 8 mg/dL — AB (ref 8.9–10.3)
CHLORIDE: 100 mmol/L — AB (ref 101–111)
CO2: 21 mmol/L — ABNORMAL LOW (ref 22–32)
CO2: 24 mmol/L (ref 22–32)
Calcium: 8.3 mg/dL — ABNORMAL LOW (ref 8.9–10.3)
Chloride: 101 mmol/L (ref 101–111)
Creatinine, Ser: 0.97 mg/dL (ref 0.44–1.00)
Creatinine, Ser: 0.75 mg/dL (ref 0.44–1.00)
GFR calc Af Amer: >60 mL/min (ref >60)
GFR calc non Af Amer: 48 mL/min — ABNORMAL LOW (ref >60)
GFR calc non Af Amer: >60 mL/min (ref >60)
GFR, EST AFRICAN AMERICAN: 56 mL/min — AB (ref >60)
GLUCOSE: 179 mg/dL — AB (ref 65–99)
Glucose, Bld: 239 mg/dL — ABNORMAL HIGH (ref 65–99)
POTASSIUM: 3.3 mmol/L — AB (ref 3.5–5.1)
POTASSIUM: 5.5 mmol/L — AB (ref 3.5–5.1)
Sodium: 136 mmol/L (ref 135–145)
Sodium: 131 mmol/L — ABNORMAL LOW (ref 135–145)

## 2017-10-17 LAB — GLUCOSE, CAPILLARY
GLUCOSE-CAPILLARY: 116 mg/dL — AB (ref 65–99)
GLUCOSE-CAPILLARY: 146 mg/dL — AB (ref 65–99)
Glucose-Capillary: 220 mg/dL — ABNORMAL HIGH (ref 65–99)
Glucose-Capillary: 177 mg/dL — ABNORMAL HIGH (ref 65–99)
Glucose-Capillary: 157 mg/dL — ABNORMAL HIGH (ref 65–99)
Glucose-Capillary: 117 mg/dL — ABNORMAL HIGH (ref 65–99)

## 2017-10-17 LAB — RESPIRATORY PANEL BY PCR
Adenovirus: NOT DETECTED
Bordetella pertussis: NOT DETECTED
CORONAVIRUS 229E-RVPPCR: NOT DETECTED
CORONAVIRUS OC43-RVPPCR: NOT DETECTED
Chlamydophila pneumoniae: NOT DETECTED
Coronavirus HKU1: NOT DETECTED
Coronavirus NL63: NOT DETECTED
INFLUENZA B-RVPPCR: NOT DETECTED
Influenza A: NOT DETECTED
METAPNEUMOVIRUS-RVPPCR: NOT DETECTED
Mycoplasma pneumoniae: NOT DETECTED
PARAINFLUENZA VIRUS 1-RVPPCR: NOT DETECTED
PARAINFLUENZA VIRUS 2-RVPPCR: NOT DETECTED
PARAINFLUENZA VIRUS 4-RVPPCR: NOT DETECTED
Parainfluenza Virus 3: NOT DETECTED
RESPIRATORY SYNCYTIAL VIRUS-RVPPCR: NOT DETECTED
RHINOVIRUS / ENTEROVIRUS - RVPPCR: NOT DETECTED

## 2017-10-17 LAB — CBC
HCT: 38.5 % (ref 36.0–46.0)
HEMOGLOBIN: 12.2 g/dL (ref 12.0–15.0)
MCH: 29 pg (ref 26.0–34.0)
MCHC: 31.7 g/dL (ref 30.0–36.0)
MCV: 91.4 fL (ref 78.0–100.0)
Platelets: 165 10*3/uL (ref 150–400)
RBC: 4.21 MIL/uL (ref 3.87–5.11)
RDW: 14 % (ref 11.5–15.5)
WBC: 14.7 10*3/uL — AB (ref 4.0–10.5)

## 2017-10-17 LAB — LACTIC ACID, PLASMA
LACTIC ACID, VENOUS: 7.2 mmol/L — AB (ref 0.5–1.9)
LACTIC ACID, VENOUS: 5.5 mmol/L — AB (ref 0.5–1.9)
Lactic Acid, Venous: 4.6 mmol/L (ref 0.5–1.9)
Lactic Acid, Venous: 1.2 mmol/L (ref 0.5–1.9)

## 2017-10-17 LAB — ECHOCARDIOGRAM COMPLETE
Height: 64 in
Weight: 2215.18 oz

## 2017-10-17 LAB — PHOSPHORUS
Phosphorus: 2.6 mg/dL (ref 2.5–4.6)
Phosphorus: 5.1 mg/dL — ABNORMAL HIGH (ref 2.5–4.6)

## 2017-10-17 LAB — MRSA PCR SCREENING: MRSA BY PCR: NEGATIVE

## 2017-10-17 LAB — I-STAT ARTERIAL BLOOD GAS, ED
Bicarbonate: 26.8 mmol/L (ref 20.0–28.0)
O2 Saturation: 94 %
PCO2 ART: 53.1 mmHg — AB (ref 32.0–48.0)
PO2 ART: 80 mmHg — AB (ref 83.0–108.0)
TCO2: 28 mmol/L (ref 22–32)
pH, Arterial: 7.31 — ABNORMAL LOW (ref 7.350–7.450)

## 2017-10-17 LAB — BRAIN NATRIURETIC PEPTIDE
B NATRIURETIC PEPTIDE 5: 845.9 pg/mL — AB (ref 0.0–100.0)
B Natriuretic Peptide: 665 pg/mL — ABNORMAL HIGH (ref 0.0–100.0)

## 2017-10-17 LAB — CBG MONITORING, ED: Glucose-Capillary: 227 mg/dL — ABNORMAL HIGH (ref 65–99)

## 2017-10-17 LAB — PROCALCITONIN: Procalcitonin: <0.1 ng/mL

## 2017-10-17 MED ORDER — ORAL CARE MOUTH RINSE
15.0000 mL | Freq: Four times a day (QID) | OROMUCOSAL | Status: DC
  Administered 2017-10-17 (×2): 15 mL via OROMUCOSAL

## 2017-10-17 MED ORDER — TOBRAMYCIN-DEXAMETHASONE 0.3-0.1 % OP OINT
TOPICAL_OINTMENT | Freq: Every day | OPHTHALMIC | Status: DC
  Administered 2017-10-17: 22:00:00 via OPHTHALMIC
  Filled 2017-10-17 (×2): qty 3.5

## 2017-10-17 MED ORDER — INSULIN ASPART 100 UNIT/ML ~~LOC~~ SOLN
2.0000 [IU] | SUBCUTANEOUS | Status: DC
  Administered 2017-10-17 (×2): 4 [IU] via SUBCUTANEOUS
  Administered 2017-10-17 (×2): 6 [IU] via SUBCUTANEOUS
  Filled 2017-10-17: qty 1

## 2017-10-17 MED ORDER — FENTANYL CITRATE (PF) 100 MCG/2ML IJ SOLN
50.0000 ug | Freq: Once | INTRAMUSCULAR | Status: AC
  Administered 2017-10-17: 50 ug via INTRAVENOUS

## 2017-10-17 MED ORDER — PANTOPRAZOLE SODIUM 40 MG IV SOLR
40.0000 mg | INTRAVENOUS | Status: DC
  Administered 2017-10-18: 40 mg via INTRAVENOUS
  Filled 2017-10-17 (×2): qty 40

## 2017-10-17 MED ORDER — POTASSIUM CHLORIDE 20 MEQ/15ML (10%) PO SOLN
40.0000 meq | Freq: Every day | ORAL | Status: AC
  Administered 2017-10-17: 40 meq via ORAL
  Filled 2017-10-17: qty 30

## 2017-10-17 MED ORDER — TOBRAMYCIN-DEXAMETHASONE 0.3-0.1 % OP SUSP
1.0000 [drp] | Freq: Four times a day (QID) | OPHTHALMIC | Status: DC
  Administered 2017-10-18 – 2017-10-21 (×7): 1 [drp] via OPHTHALMIC
  Filled 2017-10-17 (×2): qty 2.5

## 2017-10-17 MED ORDER — FENTANYL CITRATE (PF) 100 MCG/2ML IJ SOLN
INTRAMUSCULAR | Status: AC
  Filled 2017-10-17: qty 2

## 2017-10-17 MED ORDER — FENTANYL 2500MCG IN NS 250ML (10MCG/ML) PREMIX INFUSION
25.0000 ug/h | INTRAVENOUS | Status: DC
  Administered 2017-10-17: 50 ug/h via INTRAVENOUS
  Filled 2017-10-17: qty 250

## 2017-10-17 MED ORDER — SODIUM CHLORIDE 0.9 % IV SOLN: 2.0000 g | Freq: Once | INTRAVENOUS | Status: DC

## 2017-10-17 MED ORDER — IPRATROPIUM-ALBUTEROL 0.5-2.5 (3) MG/3ML IN SOLN
3.0000 mL | Freq: Four times a day (QID) | RESPIRATORY_TRACT | Status: DC
  Administered 2017-10-17 (×4): 3 mL via RESPIRATORY_TRACT
  Filled 2017-10-17 (×4): qty 3

## 2017-10-17 MED ORDER — SODIUM CHLORIDE 0.9 % IV SOLN: 250.0000 mL | INTRAVENOUS | Status: DC | PRN

## 2017-10-17 MED ORDER — ARFORMOTEROL TARTRATE 15 MCG/2ML IN NEBU
15.0000 ug | INHALATION_SOLUTION | Freq: Two times a day (BID) | RESPIRATORY_TRACT | Status: DC
  Administered 2017-10-17 – 2017-10-22 (×11): 15 ug via RESPIRATORY_TRACT
  Filled 2017-10-17 (×15): qty 2

## 2017-10-17 MED ORDER — MIDAZOLAM HCL 2 MG/2ML IJ SOLN: 1.0000 mg | INTRAMUSCULAR | Status: DC | PRN

## 2017-10-17 MED ORDER — FAMOTIDINE 40 MG/5ML PO SUSR
20.0000 mg | Freq: Two times a day (BID) | ORAL | Status: DC
  Filled 2017-10-17: qty 2.5

## 2017-10-17 MED ORDER — SODIUM CHLORIDE 0.9 % IV BOLUS (SEPSIS): 500.0000 mL | Freq: Once | INTRAVENOUS | Status: DC

## 2017-10-17 MED ORDER — ACETAMINOPHEN 325 MG PO TABS: 650.0000 mg | ORAL_TABLET | ORAL | Status: DC | PRN

## 2017-10-17 MED ORDER — FENTANYL BOLUS VIA INFUSION
25.0000 ug | INTRAVENOUS | Status: DC | PRN
  Filled 2017-10-17: qty 25

## 2017-10-17 MED ORDER — ORAL CARE MOUTH RINSE
15.0000 mL | Freq: Two times a day (BID) | OROMUCOSAL | Status: DC
  Administered 2017-10-17 – 2017-10-21 (×7): 15 mL via OROMUCOSAL

## 2017-10-17 MED ORDER — SODIUM CHLORIDE 0.9 % IV BOLUS (SEPSIS)
1000.0000 mL | Freq: Once | INTRAVENOUS | Status: AC
  Administered 2017-10-17: 1000 mL via INTRAVENOUS

## 2017-10-17 MED ORDER — FAMOTIDINE 40 MG/5ML PO SUSR
20.0000 mg | Freq: Every day | ORAL | Status: DC
  Administered 2017-10-17: 20 mg
  Filled 2017-10-17: qty 2.5

## 2017-10-17 MED ORDER — ASPIRIN EC 81 MG PO TBEC
81.0000 mg | DELAYED_RELEASE_TABLET | ORAL | Status: DC
  Administered 2017-10-20: 81 mg via ORAL
  Filled 2017-10-17: qty 1

## 2017-10-17 MED ORDER — SODIUM CHLORIDE 0.9 % IV SOLN: INTRAVENOUS | Status: DC

## 2017-10-17 MED ORDER — MAGNESIUM SULFATE 2 GM/50ML IV SOLN
2.0000 g | Freq: Once | INTRAVENOUS | Status: AC
  Administered 2017-10-17: 2 g via INTRAVENOUS
  Filled 2017-10-17: qty 50

## 2017-10-17 MED ORDER — CLOPIDOGREL BISULFATE 75 MG PO TABS
75.0000 mg | ORAL_TABLET | Freq: Every day | ORAL | Status: DC
  Administered 2017-10-17 – 2017-10-22 (×6): 75 mg via ORAL
  Filled 2017-10-17 (×6): qty 1

## 2017-10-17 MED ORDER — BUDESONIDE 0.5 MG/2ML IN SUSP
0.5000 mg | Freq: Two times a day (BID) | RESPIRATORY_TRACT | Status: DC
  Administered 2017-10-17 – 2017-10-22 (×11): 0.5 mg via RESPIRATORY_TRACT
  Filled 2017-10-17 (×15): qty 2

## 2017-10-17 MED ORDER — POTASSIUM PHOSPHATES 15 MMOLE/5ML IV SOLN
20.0000 mmol | Freq: Once | INTRAVENOUS | Status: AC
  Administered 2017-10-17: 20 mmol via INTRAVENOUS
  Filled 2017-10-17: qty 6.67

## 2017-10-17 MED ORDER — IPRATROPIUM BROMIDE 0.02 % IN SOLN: 0.5000 mg | RESPIRATORY_TRACT | Status: DC | PRN

## 2017-10-17 MED ORDER — CHLORHEXIDINE GLUCONATE 0.12% ORAL RINSE (MEDLINE KIT)
15.0000 mL | Freq: Two times a day (BID) | OROMUCOSAL | Status: DC
  Administered 2017-10-17: 15 mL via OROMUCOSAL

## 2017-10-17 MED ORDER — SODIUM CHLORIDE 0.9 % IV BOLUS (SEPSIS)
250.0000 mL | Freq: Once | INTRAVENOUS | Status: AC
  Administered 2017-10-17: 250 mL via INTRAVENOUS

## 2017-10-17 MED ORDER — SODIUM CHLORIDE 0.9 % IV SOLN
250.0000 mL | INTRAVENOUS | Status: DC | PRN
  Administered 2017-10-17: 250 mL via INTRAVENOUS

## 2017-10-17 MED ORDER — INSULIN ASPART 100 UNIT/ML ~~LOC~~ SOLN
0.0000 [IU] | SUBCUTANEOUS | Status: DC
  Administered 2017-10-18: 1 [IU] via SUBCUTANEOUS
  Administered 2017-10-18: 2 [IU] via SUBCUTANEOUS
  Administered 2017-10-18: 1 [IU] via SUBCUTANEOUS
  Administered 2017-10-19: 2 [IU] via SUBCUTANEOUS

## 2017-10-17 MED ORDER — DOCUSATE SODIUM 50 MG/5ML PO LIQD
100.0000 mg | Freq: Two times a day (BID) | ORAL | Status: DC
  Administered 2017-10-17: 100 mg
  Filled 2017-10-17: qty 10

## 2017-10-17 MED ORDER — VANCOMYCIN HCL IN DEXTROSE 1-5 GM/200ML-% IV SOLN: 1000.0000 mg | Freq: Once | INTRAVENOUS | Status: DC

## 2017-10-17 MED ORDER — HEPARIN SODIUM (PORCINE) 5000 UNIT/ML IJ SOLN
5000.0000 [IU] | Freq: Three times a day (TID) | INTRAMUSCULAR | Status: DC
  Administered 2017-10-17 – 2017-10-22 (×17): 5000 [IU] via SUBCUTANEOUS
  Filled 2017-10-17 (×19): qty 1

## 2017-10-17 MED ORDER — ONDANSETRON HCL 4 MG/2ML IJ SOLN
4.0000 mg | Freq: Four times a day (QID) | INTRAMUSCULAR | Status: DC | PRN
  Administered 2017-10-17 – 2017-10-22 (×3): 4 mg via INTRAVENOUS
  Filled 2017-10-17 (×3): qty 2

## 2017-10-17 NOTE — Progress Notes (Addendum)
Short Progress Note.  PHYSICAL EXAMINATION: General: Thin Elderly female, intubated  And awake, following commands, off sedation, weaning Neuro: PERRL, awake and alert, following commands, lip speaking HEENT: OP clear, MM moist, Orally intubated  Cardiovascular: RRR no m/r/g Lungs: CTA b/l, scant rales per bases Abdomen: Soft NT, ND, BS quiet Musculoskeletal: no LE edema, otherwise no abnormalities noted Skin: no rashes, lesions    Rounded on patient. The below issues were addressed  Lactate continues to rise, last 7.2 Hemodynamically stable no pressors IVF at 10cc/hr. Received 3 L IVF ( 30cc per KG) Per patient, she has had N&V x 2 days and has had very little to eat or drink Needs careful hydration as CXR shows  cardiomegally / diffuse pulmonary edema, suggesting CHF Plan Bolus 250 NS BNP with next blood draw FU Echo IVF to 10 Follow Lactate Follow CXR  Hypo: Potassium/ Mag / Phos Plan Repleted 3/8   Weaning well on 40 %,5/5 Alert and following commands Minimize sedation Plan: Consider extubation CXR 3/9  ABX as per original note Follow Cx ABG in am  All else per previous note dated today   Magdalen Spatz, AGACNP-BC [place] Pager # [phone] .10/17/2017 10:04 AM

## 2017-10-17 NOTE — Progress Notes (Signed)
  Echocardiogram 2D Echocardiogram has been performed.  Allyson Tineo G Tamalyn Wadsworth 10/17/2017, 3:57 PM

## 2017-10-17 NOTE — Progress Notes (Signed)
Pt extubated to 4L [place] per MD order. Pt tol well. No distress noted. Positive cuff leak present prior to extubation. Pt able to vocalize . Will cont to monitor

## 2017-10-17 NOTE — Progress Notes (Signed)
PULMONARY / CRITICAL CARE MEDICINE   Name: JULICIA KRIEGER MRN: [id number] DOB: 1920/12/31    ADMISSION DATE:  10/16/2017  REFERRING MD:  Dr. Markus Jarvis, ER  CHIEF COMPLAINT:  Short of breath  HISTORY OF PRESENT ILLNESS:   84 yo female presented to ER with nausea, dry heaves, and dyspnea.  She was hypoxic in ER and required intubation.  PMHx of asthma, colitis, colon cancer, DM, DVT, HTN, GERD, Hiatal hernia, Nephrolithiasis, HTN, HLD, Allergies.  SUBJECTIVE:  Tolerating SBT.  VITAL SIGNS: BP (!) 117/52   Pulse 88   Temp 98.5 F (36.9 C) (Oral)   Resp 12   Ht 5\' 4"  (1.626 m)   Wt 138 lb 7.2 oz (62.8 kg)   SpO2 99%   BMI 23.76 kg/m   VENTILATOR SETTINGS: Vent Mode: CPAP;PSV FiO2 (%):  [40 %-100 %] 40 % Set Rate:  [14 bmp-20 bmp] 20 bmp Vt Set:  [440 mL] 440 mL PEEP:  [5 cmH20] 5 cmH20 Pressure Support:  [5 cmH20] 5 cmH20 Plateau Pressure:  [9 cmH20-19 cmH20] 17 cmH20  INTAKE / OUTPUT: I/O last 3 completed shifts: In: 2323.3 [I.V.:23.3; IV Piggyback:2300] Out: 195 [Urine:195]  PHYSICAL EXAMINATION:  General - pleasant Eyes - pupils reactive ENT - ETT in place Cardiac - regular, no murmur Chest - no wheeze, rales Abd - soft, non tender Ext - no edema Skin - no rashes Neuro - normal strength   LABS:  BMET Recent Labs  Lab 10/16/17 2150 10/17/17 0611  NA 131* 136  K 5.0 3.3*  CL 94* 101  CO2 26 21*  BUN 21* 16  CREATININE 0.90 0.97  GLUCOSE 205* 239*    Electrolytes Recent Labs  Lab 10/16/17 2150 10/17/17 0611  CALCIUM 9.2 8.0*  MG  --  1.6*  PHOS  --  2.6    CBC Recent Labs  Lab 10/16/17 2150 10/17/17 0611  WBC 18.7* 14.7*  HGB 14.6 12.2  HCT 46.3* 38.5  PLT 248 165    Coag's No results for input(s): APTT, INR in the last 168 hours.  Sepsis Markers Recent Labs  Lab 10/17/17 0036 10/17/17 0611 10/17/17 0919  LATICACIDVEN 4.6* 7.2* 5.5*  PROCALCITON <0.10  --   --     ABG Recent Labs  Lab 10/17/17 0055 10/17/17 0340  10/17/17 0601  PHART 7.310* 7.306* 7.293*  PCO2ART 53.1* 45.5 42.9  PO2ART 80.0* 91.1 130.0*    Liver Enzymes Recent Labs  Lab 10/16/17 2150  AST 45*  ALT 26  ALKPHOS 89  BILITOT 0.7  ALBUMIN 4.0    Cardiac Enzymes No results for input(s): TROPONINI, PROBNP in the last 168 hours.  Glucose Recent Labs  Lab 10/17/17 0126 10/17/17 0336 10/17/17 0807 10/17/17 1109  GLUCAP 227* 220* 177* 157*    Imaging Dg Chest Port 1 View  Result Date: 10/16/2017 CLINICAL DATA:  Initial evaluation for intubation. EXAM: PORTABLE CHEST 1 VIEW COMPARISON:  Prior radiograph from 01/31/2017. FINDINGS: Endotracheal tube in place with tip well positioned 2.7 cm above the carina. Tip of an apparent additional enteric tube positioned above the thoracic inlet, which may be in the esophagus or airway. Stable cardiomegaly. Mediastinal silhouette normal. Aortic atherosclerosis. Failing opacities overlying the bilateral hemidiaphragms, consistent with effusions. Diffuse vascular congestion with interstitial prominence, consistent with moderate diffuse pulmonary edema. Superimposed bibasilar opacities favored to reflect atelectasis and/or edema. Superimposed infiltrates would be difficult to exclude. No pneumothorax. No acute osseous abnormality. IMPRESSION: 1. Endotracheal tube in place with tip well positioned  2.7 cm above the carina. 2. Tip of enteric tube positioned above the thoracic inlet. This may lie within the trachea or esophagus. Repositioning recommended. 3. Cardiomegaly with moderate diffuse pulmonary edema and bilateral pleural effusions, suggesting CHF. Superimposed bibasilar opacities favored to reflect atelectasis/edema. Infiltrates could be considered in the correct clinical setting. Electronically Signed   By: Jeannine Boga M.D.   On: 10/16/2017 22:47   Dg Abd Portable 1 View  Result Date: 10/17/2017 CLINICAL DATA:  OG tube placement. EXAM: PORTABLE ABDOMEN - 1 VIEW COMPARISON:  None.  FINDINGS: Tip and side-port of the enteric tube below the diaphragm in the stomach. No bowel obstruction in the visualized upper abdomen. Bibasilar opacities and perihilar edema, as seen on recent chest radiograph. IMPRESSION: Tip and side port of the enteric tube in the stomach Electronically Signed   By: Jeb Levering M.D.   On: 10/17/2017 01:35     STUDIES:  Echo 3/08 >>  CULTURES: Blood 3/07 >> Sputum 3/07 >> Respiratory viral panel 3/08 >> negative  ANTIBIOTICS: Vancomycin 3/08 >> 3/08 Cefepime 3/08 >> 3/08  SIGNIFICANT EVENTS: 3/08 Admit  LINES/TUBES: ETT 3/08 >> 3/08  DISCUSSION: 84 yo female with sudden onset of dyspnea and hypoxia.  She was started on tx for sepsis and pneumonia.  She has rapid improvement in respiratory status, respiratory viral panel negative, and procalcitonin negative.  Her chest xray findings are more suggestive of pulmonary edema.  I do not think she has a respiratory infection or sepsis.  ASSESSMENT / PLAN:  Acute respiratory failure with hypoxia, hypercapnia likely from acute pulmonary edema. Hx of asthma. - proceed with extubation - f/u CXR - scheduled BDs - d/c Abx  Acute on chronic diastolic CHF. Hx of HTN. - continue ASA - f/u Echo, BNP  Lactic acidosis. - likely from hypoxia - f/u lactic acid  DM type II. - SSI  Hypokalemia. Hypophosphatemia. - replace as needed  DVT prophylaxis - SUP - protonix Nutrition - advance diet after extubation Goals of care - full code  Updated pt's son at bedside  CC time 28 minutes  Chesley Mires, MD [place] 10/17/2017, 11:43 AM Pager:  [phone] After 3pm call: [phone]

## 2017-10-17 NOTE — Progress Notes (Signed)
2130--   At 2000,   Patient receiving albuterol breathing treatment and took off mask stating she could not breathe.  I was called into the rom and saw her oxygen sat was 77 with a reliable wave form.  I placed patient on non re-breather and her sats came up to 95 at which point she behaved as if it were a panic attack.  She became diaphoretic, tachycardic,  Restless and c/o shortness of breath.  Called the box and MD Deterdine came on, saw patient and stated to give tylenol for the discomfort.  Unable to give Tylenol PO related to patient condition at this time.  MD Louretta Parma on unit.  Patients saturationm began dropping again on non rebreather, so I asked MD Wendelyn Breslow to see the patient with me.  After speaking to family on phone, it was determined that Albuterol has done this to patient in the past on several occasions.  I gave zofran for the stomach discomfort and comforted patient through the attack.  At this point patient is sleeping comfortably vss at this time.

## 2017-10-17 NOTE — H&P (Signed)
PULMONARY / CRITICAL CARE MEDICINE   Name: [name] MRN: [id number] DOB: [birth year]    ADMISSION DATE:  10/16/2017 CONSULTATION DATE: 10/17/17  REFERRING MD: ER MD  CHIEF COMPLAINT: Respiratory failure; Pneumonia  HISTORY OF PRESENT ILLNESS:   72yoF with hx TIA, HTN, Nephrolithiasis, GERD, HTN, DVT, DM, Asthma, and Anemia, who according to ER notes had SOB and N/V x 2 days. But according to her son, patient lives in an independent living center and has family call her or visit her daily. They say she was feeling well until today when she suddenly developed SOB while at physical therapy and was noted to be hypoxic there. On arrival in the ER patient was profoundly hypoxic and ultimately required intubation. At time of my exam, she is intubated and sedated. Her son doesn't think she has had any F/C, Cough, Wheezing, or CP recently. He says she didn't have any SOB prior to today. He has a list of her medications and says she hasn't run out of any recently.   PAST MEDICAL HISTORY :  She  has a past medical history of Anemia, Arthritis, Asthma, Cataract, Chronic respiratory failure with hypoxia ([place]), Colitis, Colon cancer ([place]), Diabetes mellitus without complication ([place]), Diverticulitis, DVT (deep venous thrombosis) ([place]), Essential hypertension, Family history of adverse reaction to anesthesia, GERD (gastroesophageal reflux disease), Hearing impaired, Hiatal hernia, History of kidney stones, Hyperlipidemia, Hypertension, Pneumonia, PONV (postoperative nausea and vomiting), Seasonal allergies, and TIA (transient ischemic attack).  PAST SURGICAL HISTORY: She  has a past surgical history that includes Cholecystectomy; Appendectomy; Tonsillectomy; Total abdominal hysterectomy; Colon resection (3875); Kidney surgery (1966); Eye surgery; Joint replacement; and Total hip arthroplasty (Right, 07/11/2017).  Allergies  Allergen Reactions  . Prochlorperazine Other (See Comments)    tremor  . Celecoxib  Nausea And Vomiting and Other (See Comments)    Body aches  . Codeine Nausea And Vomiting    Tolerates hydrocodone  . Fish Oil Nausea And Vomiting  . Fosamax [Alendronate Sodium] Nausea And Vomiting  . Morphine And Related Nausea And Vomiting  . Moxifloxacin Other (See Comments)    fatigue  . Oxycodone Nausea And Vomiting    Tolerates hydrocodone  . Pregabalin Swelling  . Propoxyphene Nausea And Vomiting  . Statins Other (See Comments)    Muscle pain  . Aspirin-Dipyridamole Er Other (See Comments)    Headcache  . Buprenorphine Hcl Nausea And Vomiting   No current facility-administered medications on file prior to encounter.    Current Outpatient Medications on File Prior to Encounter  Medication Sig  . acetaminophen (TYLENOL) 500 MG tablet Take 500 mg by mouth daily.  Marland Kitchen albuterol (PROVENTIL HFA;VENTOLIN HFA) 108 (90 BASE) MCG/ACT inhaler Inhale 1 puff into the lungs every 4 (four) hours as needed for wheezing or shortness of breath.   Marland Kitchen aspirin EC 81 MG tablet Take 81 mg by mouth 2 (two) times a week. on Sunday and Thursday only.  . bisoprolol (ZEBETA) 10 MG tablet Take 1 tablet (10 mg total) by mouth daily. (Patient taking differently: Take 20 mg by mouth daily. )  . Calcium Carbonate-Vit D-Min (CALCIUM 600+D PLUS MINERALS) 600-400 MG-UNIT CHEW Chew 2 tablets every morning by mouth.   . Cholecalciferol (VITAMIN D3) 10000 units TABS Take 10,000 Units 4 (four) times a week by mouth. Monday, Tuesday, Wednesday, & Thursday  . clopidogrel (PLAVIX) 75 MG tablet Take 1 tablet (75 mg total) by mouth daily.  Marland Kitchen dexlansoprazole (DEXILANT) 60 MG capsule TAKE 1 CAPSULE BY MOUTH EVERY  MORNING BEFORE BREAKFAST.  Marland Kitchen dimenhyDRINATE (DRAMAMINE) 50 MG tablet Take 50 mg every 8 (eight) hours as needed by mouth (for dizziness/vertigo).   . ferrous sulfate 325 (65 FE) MG tablet Take 325 mg every Monday, Wednesday, and Friday by mouth. In the morning with breakfast.  . fluticasone (FLONASE) 50 MCG/ACT  nasal spray Place 1 spray 2 (two) times daily as needed into both nostrils (for allergies.).   Marland Kitchen fluticasone furoate-vilanterol (BREO ELLIPTA) 100-25 MCG/INH AEPB Inhale 1 puff into the lungs daily.  Marland Kitchen gabapentin (NEURONTIN) 100 MG capsule Take 1 capsule (100 mg total) by mouth daily.  Marland Kitchen HYDROcodone-acetaminophen (NORCO/VICODIN) 5-325 MG tablet Take 1 to 1/2 tab at bedtime as needed (Patient taking differently: Take 1 tablet by mouth at bedtime. )  . ipratropium (ATROVENT) 0.03 % nasal spray Place 1 spray into both nostrils at bedtime.   Marland Kitchen loratadine (CLARITIN) 10 MG tablet Take 10 mg by mouth daily.   Marland Kitchen NASAL SPRAY SALINE NA Place 1 spray 4 (four) times daily as needed into both nostrils (congestion).   . ondansetron (ZOFRAN) 4 MG tablet Take 2 mg 2 (two) times daily by mouth. In the morning & with hydrocodone at bedtime.  Vladimir Faster Glycol-Propyl Glycol (LUBRICANT EYE DROPS) 0.4-0.3 % SOLN Apply 1 drop to eye 4 (four) times daily.   . polyethylene glycol powder (GLYCOLAX/MIRALAX) powder Take 17 g at bedtime by mouth.  . primidone (MYSOLINE) 50 MG tablet TAKE 1/2 TABLET BY MOUTH TWICE A DAY (Patient taking differently: Take one tablet ( 50 mg) in the morning and 1/2 tab in the afternoon)  . Probiotic Product (PROBIOTIC PO) Take 1 capsule daily with lunch by mouth.   . Psyllium (METAMUCIL FIBER PO) Take 1 scoop by mouth daily.   B[facility] Cancer ophthalmic ointment APPLY IN BOTH EYES AT BEDTIME  . tobramycin-dexamethasone (TOBRADEX) ophthalmic solution INSTILL 1 DROP INTO BOTH EYES 4 TIMES A DAY  . doxycycline (VIBRAMYCIN) 100 MG capsule Take 1 capsule (100 mg total) by mouth 2 (two) times daily. (Patient not taking: Reported on 10/16/2017)   FAMILY HISTORY:  Her indicated that the status of her mother is unknown. She indicated that the status of her sister is unknown. She indicated that the status of her brother is unknown.  SOCIAL HISTORY: She  reports that  has never smoked. she has never used  smokeless tobacco. She reports that she does not drink alcohol or use drugs.  REVIEW OF SYSTEMS:   Review of Systems  Unable to perform ROS: Critical illness   SUBJECTIVE:  Intubated and sedated   VITAL SIGNS: BP (!) 100/43   Pulse 80   Temp 98.6 F (37 C) (Rectal)   Resp 18   Ht 5\' 4"  (1.626 m)   Wt 129 lb (58.5 kg)   SpO2 98%   BMI 22.14 kg/m   VENTILATOR SETTINGS: Vent Mode: PRVC FiO2 (%):  [40 %-100 %] 80 % Set Rate:  [14 bmp-18 bmp] 18 bmp Vt Set:  [440 mL] 440 mL PEEP:  [5 cmH20] 5 cmH20 Plateau Pressure:  [19 cmH20] 19 cmH20  INTAKE / OUTPUT: No intake/output data recorded.  PHYSICAL EXAMINATION: General: Thin Elderly female, intubated and sedated Neuro: PERRL, withdraws to pain, nonpurposeful movements of BUE; not obeying commands  HEENT: OP clear, MM moist, Orally intubated  Cardiovascular: RRR no m/r/g Lungs: CTA b/l Abdomen: Soft NTND Musculoskeletal: no LE edema Skin: no rashes   LABS:  BMET Recent Labs  Lab 10/16/17 2150  NA 131*  K 5.0  CL 94*  CO2 26  BUN 21*  CREATININE 0.90  GLUCOSE 205*   Electrolytes Recent Labs  Lab 10/16/17 2150  CALCIUM 9.2   CBC Recent Labs  Lab 10/16/17 2150  WBC 18.7*  HGB 14.6  HCT 46.3*  PLT 248   Coag's No results for input(s): APTT, INR in the last 168 hours.  Sepsis Markers Recent Labs  Lab 10/16/17 2236  LATICACIDVEN 2.61*   ABG Recent Labs  Lab 10/16/17 2318 10/17/17 0055  PHART 7.193* 7.310*  PCO2ART 76.2* 53.1*  PO2ART 175.0* 80.0*   Liver Enzymes Recent Labs  Lab 10/16/17 2150  AST 45*  ALT 26  ALKPHOS 89  BILITOT 0.7  ALBUMIN 4.0   Cardiac Enzymes No results for input(s): TROPONINI, PROBNP in the last 168 hours.  Glucose No results for input(s): GLUCAP in the last 168 hours.  Imaging Dg Chest Port 1 View  Result Date: 10/16/2017 CLINICAL DATA:  Initial evaluation for intubation. EXAM: PORTABLE CHEST 1 VIEW COMPARISON:  Prior radiograph from 01/31/2017.  FINDINGS: Endotracheal tube in place with tip well positioned 2.7 cm above the carina. Tip of an apparent additional enteric tube positioned above the thoracic inlet, which may be in the esophagus or airway. Stable cardiomegaly. Mediastinal silhouette normal. Aortic atherosclerosis. Failing opacities overlying the bilateral hemidiaphragms, consistent with effusions. Diffuse vascular congestion with interstitial prominence, consistent with moderate diffuse pulmonary edema. Superimposed bibasilar opacities favored to reflect atelectasis and/or edema. Superimposed infiltrates would be difficult to exclude. No pneumothorax. No acute osseous abnormality. IMPRESSION: 1. Endotracheal tube in place with tip well positioned 2.7 cm above the carina. 2. Tip of enteric tube positioned above the thoracic inlet. This may lie within the trachea or esophagus. Repositioning recommended. 3. Cardiomegaly with moderate diffuse pulmonary edema and bilateral pleural effusions, suggesting CHF. Superimposed bibasilar opacities favored to reflect atelectasis/edema. Infiltrates could be considered in the correct clinical setting. Electronically Signed   By: Jeannine Boga M.D.   On: 10/16/2017 22:47   CULTURES: Sputum culture (3/8): pending Blood cultures (3/8): pending UA (3/8) negative  ANTIBIOTICS: Vancomycin 3/7 >> Cefepime 3/8 >>  SIGNIFICANT EVENTS: 3/7: presented to ER with SOB and Acute Hypoxia >> required intubation  LINES/TUBES: 2-18G PIV's ETT 3/7 >> Foley 3/7 >> OG tube   DISCUSSION: 89yoF with hx TIA, HTN, Nephrolithiasis, GERD, DVT, DM, Asthma, and Anemia, presenting with acute onset SOB and Acute hypoxic respiratory failure requiring intubation/mechanical ventilation, found to have bilateral infiltrates consistent with pneumonia.   ASSESSMENT / PLAN:  PULMONARY 1. Acute hypoxic and Acute-on-Chronic Hypercapneic respiratory failure; Pneumonia; Asthma - CXR on my review shows hyperfinflation  (from her asthma); also shows increased interstitial markings (pulmonary edema vs pulmonary fibrosis; favor fibrosis as clinically she appears dry); and it also shows bibasilar infiltrates - ABG shows acute-on-chronic hypercapneic respiratory failure (02/28/75/175/29); increased RR from 18 to 20. Repeat ABG much improved at 7.31/53/80/26. She has severe hypoxic though as this ABG was performed on 80% FIO2 and 5 PEEP.  - continue mechanical ventilation; d/c propofol gtt as she is waking up on it and yet can't increase dose as BP already soft. Will start fentanyl gtt instead - pneumonia treatment discussed below.  - repeat CXR and ABG daily  CARDIOVASCULAR 2. Hx HTN - hold home antihypertensives for now as BP is soft from the sedation  RENAL 1. Hyperkalemia - likely related to the succ she was given during intubation; has good renal function and making good UOP. Continue to monitor  GASTROINTESTINAL 1. Hx GERD - start H2 blocker for GI prophylaxis  HEMATOLOGIC 1. Hx Anemia - History of Hgb in 8's; currently Hgb ABOVE normal at 14.6, likely due to volume contraction in setting of sepsis; Expect it to drop somewhat with IVF she is receiving. Continue to monitor.  2. Hx DVT:  - listed in her PMHx but her son says he doesn't know anything about this; he has a list of her home medications, and she is not on anticoag at home - place on heparin prophylaxis only for now  INFECTIOUS 1. Severe sepsis due to Pneumonia - CXR with BLL pneumonia as discussed above - obtain blood cultures and sputum culture; UA shows no infection. Check procalcitonin and resp viral panel. - lactate increased from 2.6 to 4.6, but this was before any IVF was given; will continue to trend lactate - give 30cc/kg IVF bolus - continue Vancomycin and Cefepime  ENDOCRINE 1. DM: - NPO; give SSI PRN  NEUROLOGIC No active issues   FAMILY  - Updates: updated patient's son (Mr Altamese Fleischmanns) in the ER room - Inter-disciplinary  family meet or Palliative Care meeting due by: 10/23/17   60 minutes critical care time  Vernie Murders, MD  Pulmonary and [place] Pager: [phone]  10/17/2017, 1:27 AM

## 2017-10-17 NOTE — ED Notes (Signed)
Pt's son Roseanne Reno phone number [phone]

## 2017-10-18 ENCOUNTER — Inpatient Hospital Stay (HOSPITAL_COMMUNITY): Payer: Medicare Other

## 2017-10-18 ENCOUNTER — Encounter (HOSPITAL_COMMUNITY): Payer: Self-pay | Admitting: *Deleted

## 2017-10-18 DIAGNOSIS — J9602 Acute respiratory failure with hypercapnia: Secondary | ICD-10-CM

## 2017-10-18 LAB — BASIC METABOLIC PANEL
Anion gap: 9 (ref 5–15)
BUN: 18 mg/dL (ref 6–20)
CHLORIDE: 100 mmol/L — AB (ref 101–111)
CO2: 23 mmol/L (ref 22–32)
CREATININE: 0.8 mg/dL (ref 0.44–1.00)
Calcium: 8.5 mg/dL — ABNORMAL LOW (ref 8.9–10.3)
GFR calc Af Amer: >60 mL/min (ref >60)
GFR calc non Af Amer: >60 mL/min (ref >60)
Glucose, Bld: 122 mg/dL — ABNORMAL HIGH (ref 65–99)
Potassium: 5.8 mmol/L — ABNORMAL HIGH (ref 3.5–5.1)
SODIUM: 132 mmol/L — AB (ref 135–145)

## 2017-10-18 LAB — GLUCOSE, CAPILLARY
GLUCOSE-CAPILLARY: 121 mg/dL — AB (ref 65–99)
GLUCOSE-CAPILLARY: 171 mg/dL — AB (ref 65–99)
Glucose-Capillary: 117 mg/dL — ABNORMAL HIGH (ref 65–99)
Glucose-Capillary: 121 mg/dL — ABNORMAL HIGH (ref 65–99)
Glucose-Capillary: 126 mg/dL — ABNORMAL HIGH (ref 65–99)
Glucose-Capillary: 94 mg/dL (ref 65–99)

## 2017-10-18 LAB — POTASSIUM: POTASSIUM: 5.5 mmol/L — AB (ref 3.5–5.1)

## 2017-10-18 LAB — CBC
HEMATOCRIT: 39.8 % (ref 36.0–46.0)
HEMOGLOBIN: 12.6 g/dL (ref 12.0–15.0)
MCH: 29.3 pg (ref 26.0–34.0)
MCHC: 31.7 g/dL (ref 30.0–36.0)
MCV: 92.6 fL (ref 78.0–100.0)
Platelets: 239 10*3/uL (ref 150–400)
RBC: 4.3 MIL/uL (ref 3.87–5.11)
RDW: 14.6 % (ref 11.5–15.5)
WBC: 24.2 10*3/uL — ABNORMAL HIGH (ref 4.0–10.5)

## 2017-10-18 LAB — MAGNESIUM: MAGNESIUM: 2 mg/dL (ref 1.7–2.4)

## 2017-10-18 LAB — BRAIN NATRIURETIC PEPTIDE: B Natriuretic Peptide: 1148.3 pg/mL — ABNORMAL HIGH (ref 0.0–100.0)

## 2017-10-18 LAB — LACTIC ACID, PLASMA: LACTIC ACID, VENOUS: 1.2 mmol/L (ref 0.5–1.9)

## 2017-10-18 LAB — PHOSPHORUS: Phosphorus: 4.1 mg/dL (ref 2.5–4.6)

## 2017-10-18 MED ORDER — PANTOPRAZOLE SODIUM 40 MG PO TBEC
40.0000 mg | DELAYED_RELEASE_TABLET | Freq: Every day | ORAL | Status: DC
  Administered 2017-10-19: 40 mg via ORAL
  Filled 2017-10-18: qty 1

## 2017-10-18 MED ORDER — FUROSEMIDE 10 MG/ML IJ SOLN
40.0000 mg | Freq: Four times a day (QID) | INTRAMUSCULAR | Status: AC
  Administered 2017-10-18 (×2): 40 mg via INTRAVENOUS
  Filled 2017-10-18 (×3): qty 4

## 2017-10-18 MED ORDER — BISOPROLOL FUMARATE 5 MG PO TABS
2.5000 mg | ORAL_TABLET | Freq: Every day | ORAL | Status: DC
  Administered 2017-10-18 – 2017-10-22 (×5): 2.5 mg via ORAL
  Filled 2017-10-18 (×2): qty 0.5
  Filled 2017-10-18: qty 1
  Filled 2017-10-18: qty 0.5
  Filled 2017-10-18: qty 1

## 2017-10-18 NOTE — Progress Notes (Signed)
0400  Wasted 228ml fentanyl in sink from day time intubation meds.  Witnessed by [employer].

## 2017-10-18 NOTE — Progress Notes (Signed)
PULMONARY / CRITICAL CARE MEDICINE   Name: [name] MRN: [id number] DOB: [birth year]    ADMISSION DATE:  10/16/2017  REFERRING MD:  Dr. Markus Jarvis, ER  CHIEF COMPLAINT:  Short of breath  HISTORY OF PRESENT ILLNESS:   84 yo female presented to ER with nausea, dry heaves, and dyspnea.  She was hypoxic in ER and required intubation.  PMHx of asthma, colitis, colon cancer, DM, DVT, HTN, GERD, Hiatal hernia, Nephrolithiasis, HTN, HLD, Allergies.  SUBJECTIVE:  Needed increased oxygen overnight.  VITAL SIGNS: BP (!) 95/51   Pulse 88   Temp (!) 96.2 F (35.7 C) (Axillary) Comment: notified RN Greg  Resp 18   Ht 5\' 4"  (1.626 m)   Wt 137 lb 9.1 oz (62.4 kg)   SpO2 91%   BMI 23.61 kg/m   INTAKE / OUTPUT: I/O last 3 completed shifts: In: 3085.6 [I.V.:197.6; IV Piggyback:2888] Out: 700 [Urine:700]  PHYSICAL EXAMINATION:  General - alert, mild increased WOB Eyes - pupils reactive ENT - no sinus tenderness, no oral exudate, no LAN Cardiac - regular, 3/6 SM Chest - b/l crackles Abd - soft, non tender Ext - no edema Skin - no rashes Neuro - normal strength  LABS:  BMET Recent Labs  Lab 10/17/17 0611 10/17/17 2145 10/18/17 0246  NA 136 131* 132*  K 3.3* 5.5* 5.8*  CL 101 100* 100*  CO2 21* 24 23  BUN 16 16 18   CREATININE 0.97 0.75 0.80  GLUCOSE 239* 179* 122*    Electrolytes Recent Labs  Lab 10/17/17 0611 10/17/17 2145 10/18/17 0246  CALCIUM 8.0* 8.3* 8.5*  MG 1.6* 2.1 2.0  PHOS 2.6 5.1* 4.1    CBC Recent Labs  Lab 10/16/17 2150 10/17/17 0611 10/18/17 0246  WBC 18.7* 14.7* 24.2*  HGB 14.6 12.2 12.6  HCT 46.3* 38.5 39.8  PLT 248 165 239    Coag's No results for input(s): APTT, INR in the last 168 hours.  Sepsis Markers Recent Labs  Lab 10/17/17 0036  10/17/17 0919 10/17/17 2145 10/18/17 0247  LATICACIDVEN 4.6*   < > 5.5* 1.2 1.2  PROCALCITON <0.10  --   --   --   --    < > = values in this interval not displayed.    ABG Recent Labs   Lab 10/17/17 0055 10/17/17 0340 10/17/17 0601  PHART 7.310* 7.306* 7.293*  PCO2ART 53.1* 45.5 42.9  PO2ART 80.0* 91.1 130.0*    Liver Enzymes Recent Labs  Lab 10/16/17 2150  AST 45*  ALT 26  ALKPHOS 89  BILITOT 0.7  ALBUMIN 4.0    Cardiac Enzymes No results for input(s): TROPONINI, PROBNP in the last 168 hours.  Glucose Recent Labs  Lab 10/17/17 0807 10/17/17 1109 10/17/17 1530 10/17/17 2005 10/17/17 2346 10/18/17 0352  GLUCAP 177* 157* 117* 116* 146* 121*    Imaging No results found.   STUDIES:  Echo 3/08 >> EF 60 to 65%, grade 2 DD, mod/severe MR, mod/severe TR, PAS 103 mmHg  CULTURES: Blood 3/07 >> Sputum 3/07 >> Respiratory viral panel 3/08 >> negative  ANTIBIOTICS: Vancomycin 3/08 >> 3/08 Cefepime 3/08 >> 3/08  SIGNIFICANT EVENTS: 3/08 Admit  LINES/TUBES: ETT 3/08 >> 3/08  DISCUSSION: 84 yo female with sudden onset of dyspnea and hypoxia.  She was started on tx for sepsis and pneumonia.  She has rapid improvement in respiratory status, respiratory viral panel negative, and procalcitonin negative.  Her chest xray findings are more suggestive of pulmonary edema.  I do not think  she has a respiratory infection or sepsis.  ASSESSMENT / PLAN:  Acute respiratory failure with hypoxia, hypercapnia likely from acute pulmonary edema. Hx of asthma. - oxygen to keep SpO2 90 to 95% - Bipap prn - will need to d/w family about whether she should be intubation candidate again - f/u CXR - scheduled BDs  Acute on chronic diastolic CHF. Moderate to severe mitral regurgitation. WHO group 2 pulmonary hypertension.  Hx of HTN. - continue ASA - lasix 40 mg IV bid - depending on family discussion will determine if she needs cardiology assessment  Lactic acidosis. - resolved  DM type II. - SSI  Hypokalemia. Hypophosphatemia. - f/u labs  DVT prophylaxis - heparin SQ SUP - protonix Nutrition - advance diet after extubation Goals of care - full  code  CC time 31 minutes  Chesley Mires, MD [place] 10/18/2017, 7:46 AM Pager:  [phone] After 3pm call: [phone]

## 2017-10-18 NOTE — Progress Notes (Signed)
Spoke with pt's daughter.  Explained concern about acute pulmonary edema and Echo findings.  Explained plan for diuresis, cardiology consult and supplemental oxygen with Bipap as needed.  She will d/w her sibling and await further input from cardiology.  She is to remain full code status for now.  Chesley Mires, MD [facility] Pulmonary/Critical Care 10/18/2017, 8:36 AM Pager:  [phone] After 3pm call: [phone]

## 2017-10-18 NOTE — Plan of Care (Signed)
  Progressing Elimination: Will not experience complications related to bowel motility 10/18/2017 0315 - Progressing by Terald Sleeper, RN Will not experience complications related to urinary retention 10/18/2017 0315 - Progressing by Terald Sleeper, RN Pain Managment: General experience of comfort will improve 10/18/2017 0315 - Progressing by Terald Sleeper, RN Safety: Ability to remain free from injury will improve 10/18/2017 0315 - Progressing by Terald Sleeper, RN Skin Integrity: Risk for impaired skin integrity will decrease 10/18/2017 0315 - Progressing by Terald Sleeper, RN Clinical Measurements: Diagnostic test results will improve 10/18/2017 0315 - Progressing by Terald Sleeper, RN Signs and symptoms of infection will decrease 10/18/2017 0315 - Progressing by Terald Sleeper, RN   Not Progressing Respiratory: Ability to maintain adequate ventilation will improve 10/18/2017 0315 - Not Progressing by Terald Sleeper, RN Note Remove non rebreather for more than a minute and patient will de sat into 70's

## 2017-10-18 NOTE — Consult Note (Addendum)
Reason for Consult: Acute pulmonary edema Referring Physician: Gaetano Net, MD  [name] is an 84 y.o. female.  HPI: [name] is a Caucasian female who is fairly active in spite of of 84 years of age, lives independently, has had history of remote TIA and on chronic Plavix, details not completely available was treated  by [facility] neurology.  She has been doing well until 2 evenings ago, she was speaking to her daughter for the telephone, complained of being very weak and also short of breath.  She activated emergency resident alert and by the time EMT and she was found to be in acute respiratory distress and was transferred to [facility], was intubated in the emergency room.  She was initially treated as probable pneumonia but was aggressively diuresed with improvement in symptoms and was extubated just 4-6 hours after intubation.  But she again developed respiratory distress which improved with diuresis, echocardiogram revealed severe MR.  I was consulted to further manage the patient.  Patient is reclined in the back, has mild respiratory distress and history was mostly obtained by her son who is present.  She has been doing well prior to the presentation.  She has had chronic dyspnea which was considered mild and has been using bronchodilator therapy and was managed by Dr. Christinia Gully for COPD.  Patient states that she smoked very rarely in her younger years but no history of any tobacco use.  She has history of hypertension, but otherwise no history of diabetes or hyperlipidemia.  No fever, no chills.  No recent dental workup.No painful swelling of the lower extremities.  Past Medical History:  Diagnosis Date  . Anemia    iron deficiency  . Arthritis   . Asthma   . Cataract   . Chronic respiratory failure with hypoxia ([place])   . Colitis   . Colon cancer ([place])    basal cell nose  . Diabetes mellitus without complication ([place])    NO meds ever [place] related to steroid  use. Has more problems eith hypoglycemia  . Diverticulitis   . DVT (deep venous thrombosis) ([place])   . Essential hypertension   . Family history of adverse reaction to anesthesia    both daughters have problems getting bp up after anesthesia  . GERD (gastroesophageal reflux disease)   . Hearing impaired   . Hiatal hernia    neuropathy  . History of kidney stones   . Hyperlipidemia   . Hypertension   . Pneumonia   . PONV (postoperative nausea and vomiting)    BP drops really low cant breathe well vomiting  . Seasonal allergies   . TIA (transient ischemic attack)    x2 2 years ago    Past Surgical History:  Procedure Laterality Date  . APPENDECTOMY    . CHOLECYSTECTOMY    . COLON RESECTION  1967  . EYE SURGERY     cataract  . JOINT REPLACEMENT     Right hip Dr. Ninfa Linden 07/11/17  . KIDNEY SURGERY  1966   cyst  . TONSILLECTOMY    . TOTAL ABDOMINAL HYSTERECTOMY    . TOTAL HIP ARTHROPLASTY Right 07/11/2017   Procedure: RIGHT TOTAL HIP ARTHROPLASTY ANTERIOR APPROACH;  Surgeon: Mcarthur Rossetti, MD;  Location: [facility] ORS;  Service: Orthopedics;  Laterality: Right;    Family History  Problem Relation Age of Onset  . Breast cancer Mother   . Heart attack Mother   . Emphysema Sister  smoked  . Prostate cancer Brother   . Heart disease Brother     Social History:  reports that she has quit smoking. Her smoking use included cigarettes. she has never used smokeless tobacco. She reports that she does not drink alcohol or use drugs.  Allergies:  Allergies  Allergen Reactions  . Prochlorperazine Other (See Comments)    tremor  . Celecoxib Nausea And Vomiting and Other (See Comments)    Body aches  . Codeine Nausea And Vomiting    Tolerates hydrocodone  . Fish Oil Nausea And Vomiting  . Fosamax [Alendronate Sodium] Nausea And Vomiting  . Morphine And Related Nausea And Vomiting  . Moxifloxacin Other (See Comments)    fatigue  . Oxycodone Nausea And Vomiting     Tolerates hydrocodone  . Pregabalin Swelling  . Propoxyphene Nausea And Vomiting  . Statins Other (See Comments)    Muscle pain  . Aspirin-Dipyridamole Er Other (See Comments)    Headcache  . Buprenorphine Hcl Nausea And Vomiting   Dg Chest Port 1 View  Result Date: 10/18/2017 CLINICAL DATA:  Respiratory failure. EXAM: PORTABLE CHEST 1 VIEW COMPARISON:  10/16/2017 FINDINGS: Endotracheal and enteric tubes have been removed. The cardiac silhouette remains mildly enlarged. Aortic atherosclerosis is noted. Veiling opacities in the right greater than left lung bases obscure the hemidiaphragms and have increased from prior. There is pulmonary vascular congestion with diffuse interstitial and patchy airspace opacities in both lungs, similar to the prior study. No pneumothorax is identified. IMPRESSION: 1. Interval extubation. 2. Increasing bilateral pleural effusions. 3. Similar diffuse interstitial and patchy airspace opacities in both lungs which may reflect edema or pneumonia. Electronically Signed   By: Logan Bores M.D.   On: 10/18/2017 07:48   Dg Chest Port 1 View  Result Date: 10/16/2017 CLINICAL DATA:  Initial evaluation for intubation. EXAM: PORTABLE CHEST 1 VIEW COMPARISON:  Prior radiograph from 01/31/2017. FINDINGS: Endotracheal tube in place with tip well positioned 2.7 cm above the carina. Tip of an apparent additional enteric tube positioned above the thoracic inlet, which may be in the esophagus or airway. Stable cardiomegaly. Mediastinal silhouette normal. Aortic atherosclerosis. Failing opacities overlying the bilateral hemidiaphragms, consistent with effusions. Diffuse vascular congestion with interstitial prominence, consistent with moderate diffuse pulmonary edema. Superimposed bibasilar opacities favored to reflect atelectasis and/or edema. Superimposed infiltrates would be difficult to exclude. No pneumothorax. No acute osseous abnormality. IMPRESSION: 1. Endotracheal tube in place with  tip well positioned 2.7 cm above the carina. 2. Tip of enteric tube positioned above the thoracic inlet. This may lie within the trachea or esophagus. Repositioning recommended. 3. Cardiomegaly with moderate diffuse pulmonary edema and bilateral pleural effusions, suggesting CHF. Superimposed bibasilar opacities favored to reflect atelectasis/edema. Infiltrates could be considered in the correct clinical setting. Electronically Signed   By: Jeannine Boga M.D.   On: 10/16/2017 22:47   Dg Abd Portable 1 View  Result Date: 10/17/2017 CLINICAL DATA:  OG tube placement. EXAM: PORTABLE ABDOMEN - 1 VIEW COMPARISON:  None. FINDINGS: Tip and side-port of the enteric tube below the diaphragm in the stomach. No bowel obstruction in the visualized upper abdomen. Bibasilar opacities and perihilar edema, as seen on recent chest radiograph. IMPRESSION: Tip and side port of the enteric tube in the stomach Electronically Signed   By: Jeb Levering M.D.   On: 10/17/2017 01:35   BMP Latest Ref Rng & Units 10/18/2017 10/17/2017 10/17/2017  Glucose 65 - 99 mg/dL 122(H) 179(H) 239(H)  BUN 6 - 20  mg/dL 18 16 16   Creatinine 0.44 - 1.00 mg/dL 0.80 0.75 0.97  Sodium 135 - 145 mmol/L 132(L) 131(L) 136  Potassium 3.5 - 5.1 mmol/L 5.8(H) 5.5(H) 3.3(L)  Chloride 101 - 111 mmol/L 100(L) 100(L) 101  CO2 22 - 32 mmol/L 23 24 21(L)  Calcium 8.9 - 10.3 mg/dL 8.5(L) 8.3(L) 8.0(L)    CMP Latest Ref Rng & Units 10/18/2017 10/17/2017 10/17/2017  Glucose 65 - 99 mg/dL 122(H) 179(H) 239(H)  BUN 6 - 20 mg/dL 18 16 16   Creatinine 0.44 - 1.00 mg/dL 0.80 0.75 0.97  Sodium 135 - 145 mmol/L 132(L) 131(L) 136  Potassium 3.5 - 5.1 mmol/L 5.8(H) 5.5(H) 3.3(L)  Chloride 101 - 111 mmol/L 100(L) 100(L) 101  CO2 22 - 32 mmol/L 23 24 21(L)  Calcium 8.9 - 10.3 mg/dL 8.5(L) 8.3(L) 8.0(L)  Total Protein 6.5 - 8.1 g/dL - - -  Total Bilirubin 0.3 - 1.2 mg/dL - - -  Alkaline Phos 38 - 126 U/L - - -  AST 15 - 41 U/L - - -  ALT 14 - 54 U/L - - -    CBC Latest Ref Rng & Units 10/18/2017 10/17/2017 10/16/2017  WBC 4.0 - 10.5 K/uL 24.2(H) 14.7(H) 18.7(H)  Hemoglobin 12.0 - 15.0 g/dL 12.6 12.2 14.6  Hematocrit 36.0 - 46.0 % 39.8 38.5 46.3(H)  Platelets 150 - 400 K/uL 239 165 248   BNP (last 3 results) Recent Labs    10/17/17 0036 10/17/17 0611 10/18/17 0749  BNP 665.0* 845.9* 1,148.3*   Review of Systems  Constitutional: Positive for diaphoresis and malaise/fatigue. Negative for chills, fever and weight loss.  HENT: Negative.   Eyes: Negative.   Respiratory: Positive for cough, shortness of breath and wheezing. Negative for hemoptysis and sputum production.   Cardiovascular: Positive for orthopnea and PND. Negative for chest pain, palpitations, claudication and leg swelling.  Gastrointestinal: Positive for abdominal pain (chronic) and heartburn. Negative for blood in stool, constipation and diarrhea.  Genitourinary: Negative.   Musculoskeletal: Negative.   Skin: Negative.   Neurological: Positive for weakness.  Endo/Heme/Allergies: Negative.   Psychiatric/Behavioral: Negative.    Blood pressure (!) 108/57, pulse 96, temperature 98.4 F (36.9 C), temperature source Oral, resp. rate 20, height 5\' 4"  (1.626 m), weight 62.4 kg (137 lb 9.1 oz), SpO2 95 %. Physical Exam  Constitutional: She is oriented to person, place, and time. She appears well-nourished. She appears distressed (mild respiratory distress).  Moderately built, appears younger than stated age  Eyes: Conjunctivae are normal.  Neck: Neck supple. JVD present. No tracheal deviation present.  Cardiovascular:  S1 [place] with pansystolic murmur at the apex with radiation to the axilla.  S2 is normal.  No gallop appreciated. Bilateral carotids normal and no obvious bruit and difficult to hear sounds.  Femoral pulse normal, no bruit.  Pedal pulses are absent, however normal capillary refill.  Respiratory: She is in respiratory distress (mild). She has rales (At the bases).   GI: Soft. She exhibits no mass. There is tenderness (Mild diffuse tenderness present, patient states chronic). There is no rebound and no guarding.  Musculoskeletal: Normal range of motion.  Lymphadenopathy:    She has no cervical adenopathy.  Neurological: She is alert and oriented to person, place, and time.  Skin: Skin is warm. She is not diaphoretic.  Psychiatric: She has a normal mood and affect.   Scheduled Meds: . arformoterol  15 mcg Nebulization BID  . [START ON 10/20/2017] aspirin EC  81 mg Oral Once per  day on Mon Thu  . budesonide (PULMICORT) nebulizer solution  0.5 mg Nebulization BID  . clopidogrel  75 mg Oral Daily  . heparin  5,000 Units Subcutaneous Q8H  . insulin aspart  0-9 Units Subcutaneous Q4H  . mouth rinse  15 mL Mouth Rinse BID  . [START ON 10/19/2017] pantoprazole  40 mg Oral Daily  . tobramycin-dexamethasone   Both Eyes QHS  . tobramycin-dexamethasone  1 drop Both Eyes Q6H   Continuous Infusions: . sodium chloride     PRN Meds:.sodium chloride, acetaminophen, ipratropium, ondansetron (ZOFRAN) IV   Echocardiogram 10/17/2017: Normal LV size, mild LVH, normal LVEF at 60-65%, no Hacking motion abnormality.  Grade 2 diastolic dysfunction.  Mildly thickened mitral valve leaflet.  Mild mitral annular calcification.  Moderate to severe MR.  Moderate left atrial enlargement.  RV is mildly dilated with normal function.  Moderate to severe TR with severe pulmonary hypertension, PASP 103 mmHg.  EKG 10/16/2017: Sinus tachycardia at the rate of 103 bpm.  Biatrial enlargement.  Nonspecific ST abnormality.  Compared to 01/30/2017, sinus tachycardia new, nonspecific ST abnormalities new.  Assessment/Plan: 1.  Acute diastolic heart failure secondary to valvular heart disease. 2.  Acute pulmonary edema 3.  Severe MR by physical examination.  Abnormal echocardiogram evaluated. 4.  Moderate to severe TR and severe PH, suspect secondary pulmonary hypertension. 5.  Hypertension 6.   Asthmatic bronchitis 7. History of TIA sometime in 2016, being managed in [facility].  No detailed history available.   Recommendation: Although patient is 84 years of age, she is fairly active, lives in independent living and also continues to drive and manage her books by herself.  I suspect heart dyspnea may have been related to valvular heart disease all along, she has barely smoked any cigarettes past.  At this point I will add low-dose of a beta-blocker 9On Bisoprolol 10 mg as op)  and more importantly an ACE inhibitor or an ARB for CHF, continue IV diuresis for now and I will consider TEE when she is clinically stable as a outpatient procedure on inpatient procedure.  Once clinically stable a CT surgical consult may be appropriate prior to proceeding with any invasive approach apart from TEE to evaluate the pathology.   She has severe hyperkalemia, I would recheck BMP today.  Will hold starting any ACE inhibitors or ARB until potassium is corrected.  I had a 20-minute face-to-face discussion with the son who is present at the bedside in addition to 45 minutes of critical care and records evaluation and additional 15 minutes to document.  Patient in respiratory distress, on 100% NRB, suspect her saturations will improve with diuresis.  We also discussed regarding DNR status, patient has expressed that she should be full code but does not want to be kept on "Breathing machine if irreversible".  Hence patient will be continued as "full code".  Adrian Prows, MD 10/18/2017, 2:14 PM [facility] [place] Pager: [phone] Office: [phone] If no answer: Cell:  [phone]

## 2017-10-19 ENCOUNTER — Encounter (HOSPITAL_COMMUNITY): Payer: Self-pay | Admitting: *Deleted

## 2017-10-19 ENCOUNTER — Inpatient Hospital Stay (HOSPITAL_COMMUNITY): Payer: Medicare Other

## 2017-10-19 LAB — GLUCOSE, CAPILLARY
GLUCOSE-CAPILLARY: 93 mg/dL (ref 65–99)
GLUCOSE-CAPILLARY: 99 mg/dL (ref 65–99)
GLUCOSE-CAPILLARY: 156 mg/dL — AB (ref 65–99)
GLUCOSE-CAPILLARY: 114 mg/dL — AB (ref 65–99)
Glucose-Capillary: 113 mg/dL — ABNORMAL HIGH (ref 65–99)
Glucose-Capillary: 98 mg/dL (ref 65–99)

## 2017-10-19 LAB — BASIC METABOLIC PANEL
Anion gap: 9 (ref 5–15)
BUN: 30 mg/dL — AB (ref 6–20)
CHLORIDE: 96 mmol/L — AB (ref 101–111)
CO2: 27 mmol/L (ref 22–32)
Calcium: 8.6 mg/dL — ABNORMAL LOW (ref 8.9–10.3)
Creatinine, Ser: 1.09 mg/dL — ABNORMAL HIGH (ref 0.44–1.00)
GFR calc Af Amer: 49 mL/min — ABNORMAL LOW (ref >60)
GFR calc non Af Amer: 42 mL/min — ABNORMAL LOW (ref >60)
GLUCOSE: 103 mg/dL — AB (ref 65–99)
Potassium: 5 mmol/L (ref 3.5–5.1)
SODIUM: 132 mmol/L — AB (ref 135–145)

## 2017-10-19 LAB — CBC
HCT: 31.6 % — ABNORMAL LOW (ref 36.0–46.0)
Hemoglobin: 10.2 g/dL — ABNORMAL LOW (ref 12.0–15.0)
MCH: 30 pg (ref 26.0–34.0)
MCHC: 32.3 g/dL (ref 30.0–36.0)
MCV: 92.9 fL (ref 78.0–100.0)
Platelets: 139 10*3/uL — ABNORMAL LOW (ref 150–400)
RBC: 3.4 MIL/uL — ABNORMAL LOW (ref 3.87–5.11)
RDW: 15 % (ref 11.5–15.5)
WBC: 10.8 10*3/uL — ABNORMAL HIGH (ref 4.0–10.5)

## 2017-10-19 MED ORDER — FLUTICASONE PROPIONATE 50 MCG/ACT NA SUSP
1.0000 | Freq: Every day | NASAL | Status: DC
  Administered 2017-10-19 – 2017-10-22 (×4): 1 via NASAL
  Filled 2017-10-19: qty 16

## 2017-10-19 MED ORDER — SALINE SPRAY 0.65 % NA SOLN
2.0000 | Freq: Two times a day (BID) | NASAL | Status: DC
  Administered 2017-10-19 – 2017-10-21 (×6): 2 via NASAL
  Filled 2017-10-19: qty 44

## 2017-10-19 MED ORDER — ISOSORB DINITRATE-HYDRALAZINE 20-37.5 MG PO TABS
1.0000 | ORAL_TABLET | Freq: Two times a day (BID) | ORAL | Status: DC
  Administered 2017-10-19 – 2017-10-22 (×7): 1 via ORAL
  Filled 2017-10-19 (×7): qty 1

## 2017-10-19 NOTE — Progress Notes (Signed)
eLink Physician-Brief Progress Note Patient Name: [name] DOB: 1921-01-25 MRN: [id number]   Date of Service  10/19/2017  HPI/Events of Note  Progress notes from both cardiology and PCCM state to continue diuresis. However, she is not currently being diuresed. When attempting to order Lasix, Bumex or Demedex, Epic states that she has allergies to all of these agents. Sat = 99% and and RR = 17. She is not in respiratory distress, therefore, will hold off ordering diuretic at this time.   eICU Interventions  Would ask PCCM rounding team and cardiology to address matter of diuresis and what agent to use in AM.      Intervention Category Major Interventions: Other:  Lysle Dingwall 10/19/2017, 8:55 PM

## 2017-10-19 NOTE — Progress Notes (Signed)
Subjective:  Feels much better, complains of sore throat.  States that she is now coughing up some phlegm.  Occasionally blood-tinged.  Objective:  Vital Signs in the last 24 hours: Temp:  [97.7 F (36.5 C)-98.4 F (36.9 C)] 98.1 F (36.7 C) (03/10 0822) Pulse Rate:  [34-107] 71 (03/10 0700) Resp:  [14-22] 14 (03/10 0700) BP: (90-117)/(43-68) 100/51 (03/10 0700) SpO2:  [90 %-98 %] 93 % (03/10 0851) FiO2 (%):  [50 %] 50 % (03/10 0400) Weight:  [62.9 kg (138 lb 10.7 oz)] 62.9 kg (138 lb 10.7 oz) (03/10 0500)  Intake/Output from previous day: 03/09 0701 - 03/10 0700 In: 240 [P.O.:240] Out: 1575 [Urine:1575]  Physical Exam:  General appearance: alert, cooperative, appears stated age and no distress Eyes: negative findings: lids and lashes normal Neck: no adenopathy, no carotid bruit, no JVD, supple, symmetrical, trachea midline and thyroid not enlarged, symmetric, no tenderness/mass/nodules Neck: JVP - normal, carotids 2+= without bruits Resp: Bilateral coarse basal crackles present, right worse than the left. Chest Menor: no tenderness Cardio: S1 Muffled with pansystolic murmur at the apex with radiation to the axilla.  S2 is normal.  No gallop appreciated. GI: soft, non-tender; bowel sounds normal; no masses,  no organomegaly Extremities: extremities normal, atraumatic, no cyanosis or edema    Lab Results: BMP Recent Labs    10/17/17 2145 10/18/17 0246 10/18/17 1439 10/19/17 0336  NA 131* 132*  --  132*  K 5.5* 5.8* 5.5* 5.0  CL 100* 100*  --  96*  CO2 24 23  --  27  GLUCOSE 179* 122*  --  103*  BUN 16 18  --  30*  CREATININE 0.75 0.80  --  1.09*  CALCIUM 8.3* 8.5*  --  8.6*  GFRNONAA >60 >60  --  42*  GFRAA >60 >60  --  49*    CBC Recent Labs  Lab 10/16/17 2150  10/19/17 0336  WBC 18.7*   < > 10.8*  RBC 5.03   < > 3.40*  HGB 14.6   < > 10.2*  HCT 46.3*   < > 31.6*  PLT 248   < > 139*  MCV 92.0   < > 92.9  MCH 29.0   < > 30.0  MCHC 31.5   < > 32.3  RDW  14.0   < > 15.0  LYMPHSABS 4.5*  --   --   MONOABS 2.1*  --   --   EOSABS 0.2  --   --   BASOSABS 0.0  --   --    < > = values in this interval not displayed.    HEMOGLOBIN A1C Lab Results  Component Value Date   HGBA1C 5.7 (H) 07/01/2017   MPG 116.89 07/01/2017   Lipid Panel     Component Value Date/Time   CHOL 204 (A) 02/23/2016   TRIG 103 02/23/2016   HDL 72 (A) 02/23/2016   LDLCALC 111 02/23/2016     Hepatic Function Panel Recent Labs    10/16/17 2150  PROT 7.4  ALBUMIN 4.0  AST 45*  ALT 26  ALKPHOS 89  BILITOT 0.7    Imaging: Dg Chest Port 1 View  Result Date: 10/19/2017 CLINICAL DATA:  CHF EXAM: PORTABLE CHEST 1 VIEW COMPARISON:  10/18/2017 FINDINGS: Cardiomegaly. Moderate bilateral pleural effusions. Diffuse bilateral airspace disease again noted, not significantly changed. IMPRESSION: Continued diffuse bilateral airspace disease which could reflect edema or infection. Stable moderate effusions. Electronically Signed   By: Rolm Baptise M.D.  On: 10/19/2017 07:16   Dg Chest Port 1 View  Result Date: 10/18/2017 CLINICAL DATA:  Respiratory failure. EXAM: PORTABLE CHEST 1 VIEW COMPARISON:  10/16/2017 FINDINGS: Endotracheal and enteric tubes have been removed. The cardiac silhouette remains mildly enlarged. Aortic atherosclerosis is noted. Veiling opacities in the right greater than left lung bases obscure the hemidiaphragms and have increased from prior. There is pulmonary vascular congestion with diffuse interstitial and patchy airspace opacities in both lungs, similar to the prior study. No pneumothorax is identified. IMPRESSION: 1. Interval extubation. 2. Increasing bilateral pleural effusions. 3. Similar diffuse interstitial and patchy airspace opacities in both lungs which may reflect edema or pneumonia. Electronically Signed   By: Logan Bores M.D.   On: 10/18/2017 07:48    Cardiac Studies: Echocardiogram 10/17/2017: Normal LV size, mild LVH, normal LVEF at  60-65%, no [place] motion abnormality.  Grade 2 diastolic dysfunction.  Mildly thickened mitral valve leaflet.  Mild mitral annular calcification.  Moderate to severe MR.  Moderate left atrial enlargement.  RV is mildly dilated with normal function.  Moderate to severe TR with severe pulmonary hypertension, PASP 103 mmHg.  EKG 10/16/2017: Sinus tachycardia at the rate of 103 bpm.  Biatrial enlargement.  Nonspecific ST abnormality.  Compared to 01/30/2017, sinus tachycardia new, nonspecific ST abnormalities new.  Assessment/Plan:  1.  Acute diastolic heart failure secondary to valvular heart disease. 2.  Acute pulmonary edema 3.  Severe MR by physical examination.  Abnormal echocardiogram evaluated. 4.  Moderate to severe TR and severe PH, suspect secondary pulmonary hypertension. 5.  Hypertension 6.  Asthmatic bronchitis 7. History of TIA sometime in 2016, being managed in [facility].  No detailed history available. 8. Hyperkalemia. 9. Hyperglycemia 10. Mild hyperlipidemia.  Recommendation: Continue IV diuresis for 24 hours and will probably switch to p.o.  She is bringing up yellowish phlegm, a complaint of either bronchitis or pneumonia cannot be excluded although she does not have fever and is not septic.  I will leave it to the pulmonary team for recommendation.  She is tolerating bisoprolol at a low dose.  I will add BiDil 1 p.o. 3 times daily for CHF.  ACE inhibitor/ARB contraindicated due to hyperkalemia.  Blood pressure is controlled, she does have mild hyperlipidemia, I will start her on low-dose of a statin with Lipitor 10 mg daily.  Not a diabetic.  We will continue to follow.  Patient appears much improved this morning, will have her sit on the side of the bed for at least some time.   Adrian Prows, M.D. 10/19/2017, 10:04 AM [facility] Pager: [phone] Office: [phone] If no answer: [phone]

## 2017-10-19 NOTE — Progress Notes (Signed)
PULMONARY / CRITICAL CARE MEDICINE   Name: [name] MRN: [id number] DOB: 01-31-1921    ADMISSION DATE:  10/16/2017  REFERRING MD:  Dr. Markus Jarvis, ER  CHIEF COMPLAINT:  Short of breath  HISTORY OF PRESENT ILLNESS:   84 yo female presented to ER with nausea, dry heaves, and dyspnea.  She was hypoxic in ER and required intubation.  Found to have CHF with acute pulmonary edema and new valvular heart disease.  PMHx of asthma, colitis, colon cancer, DM, DVT, HTN, GERD, Hiatal hernia, Nephrolithiasis, HTN, HLD, Allergies.  SUBJECTIVE:  Has sinus congestion and post nasal drip with cough.  C/o nausea.  Not much appetite.  Breathing feels better.  No fever.  VITAL SIGNS: BP (!) 100/51   Pulse 71   Temp 98.1 F (36.7 C) (Oral)   Resp 14   Ht 5\' 4"  (1.626 m)   Wt 138 lb 10.7 oz (62.9 kg)   SpO2 93%   BMI 23.80 kg/m   INTAKE / OUTPUT: I/O last 3 completed shifts: In: 240 [P.O.:240] Out: 1790 [Urine:1790]  PHYSICAL EXAMINATION:  General - pleasant Eyes - pupils reactive ENT - no sinus tenderness, no oral exudate, no LAN Cardiac - regular, 3/6 systolic murmur Chest - basilar rales Abd - soft, non tender Ext - no edema Skin - no rashes Neuro - normal strength Psych - normal mood   LABS:  BMET Recent Labs  Lab 10/17/17 2145 10/18/17 0246 10/18/17 1439 10/19/17 0336  NA 131* 132*  --  132*  K 5.5* 5.8* 5.5* 5.0  CL 100* 100*  --  96*  CO2 24 23  --  27  BUN 16 18  --  30*  CREATININE 0.75 0.80  --  1.09*  GLUCOSE 179* 122*  --  103*    Electrolytes Recent Labs  Lab 10/17/17 0611 10/17/17 2145 10/18/17 0246 10/19/17 0336  CALCIUM 8.0* 8.3* 8.5* 8.6*  MG 1.6* 2.1 2.0  --   PHOS 2.6 5.1* 4.1  --     CBC Recent Labs  Lab 10/17/17 0611 10/18/17 0246 10/19/17 0336  WBC 14.7* 24.2* 10.8*  HGB 12.2 12.6 10.2*  HCT 38.5 39.8 31.6*  PLT 165 239 139*    Coag's No results for input(s): APTT, INR in the last 168 hours.  Sepsis Markers Recent Labs  Lab  10/17/17 0036  10/17/17 0919 10/17/17 2145 10/18/17 0247  LATICACIDVEN 4.6*   < > 5.5* 1.2 1.2  PROCALCITON <0.10  --   --   --   --    < > = values in this interval not displayed.    ABG Recent Labs  Lab 10/17/17 0055 10/17/17 0340 10/17/17 0601  PHART 7.310* 7.306* 7.293*  PCO2ART 53.1* 45.5 42.9  PO2ART 80.0* 91.1 130.0*    Liver Enzymes Recent Labs  Lab 10/16/17 2150  AST 45*  ALT 26  ALKPHOS 89  BILITOT 0.7  ALBUMIN 4.0    Cardiac Enzymes No results for input(s): TROPONINI, PROBNP in the last 168 hours.  Glucose Recent Labs  Lab 10/18/17 1618 10/18/17 2040 10/18/17 2356 10/19/17 0045 10/19/17 0339 10/19/17 0823  GLUCAP 171* 126* 94 98 93 99    Imaging Dg Chest Port 1 View  Result Date: 10/19/2017 CLINICAL DATA:  CHF EXAM: PORTABLE CHEST 1 VIEW COMPARISON:  10/18/2017 FINDINGS: Cardiomegaly. Moderate bilateral pleural effusions. Diffuse bilateral airspace disease again noted, not significantly changed. IMPRESSION: Continued diffuse bilateral airspace disease which could reflect edema or infection. Stable moderate effusions. Electronically  Signed   By: Rolm Baptise M.D.   On: 10/19/2017 07:16     STUDIES:  Echo 3/08 >> EF 60 to 65%, grade 2 DD, mod/severe MR, mod/severe TR, PAS 103 mmHg  CULTURES: Blood 3/07 >> Sputum 3/07 >> Respiratory viral panel 3/08 >> negative  ANTIBIOTICS: Vancomycin 3/08 >> 3/08 Cefepime 3/08 >> 3/08  SIGNIFICANT EVENTS: 3/08 Admit 3/09 cardiology consulted 3/10 transfer to SDU  LINES/TUBES: ETT 3/08 >> 3/08  DISCUSSION: Responds well to diuresis.  Don't think she has respiratory infection requiring Abx.  Likely has post nasal drip causing cough.    ASSESSMENT / PLAN:  Acute respiratory failure with hypoxia, hypercapnia likely from acute pulmonary edema. Hx of asthma. - oxygen to keep SpO2 > 92% - can d/c Bipap - f/u CXR intermittently - continue BDs  Acute on chronic diastolic CHF. Moderate to severe  mitral regurgitation. WHO group 2 pulmonary hypertension.  Hx of HTN. - appreciate help from cardiology - continue lasix - zebeta, bidil added - continue ASA, plavix  Upper airway cough syndrome. - nasal irrigation, flonase  Lactic acidosis. - resolved  DM type II. - SSI  Hyperkalemia. Hypophosphatemia. - f/u labs  Mild thrombocytopenia. - f/u CBC  Deconditioning. - had recent hip surgery - PT assessment  DVT prophylaxis - heparin SQ SUP - protonix Nutrition - carb modified diet Goals of care - full code  Progressive to SDU 3/10 >> To [facility] 3/11 and PCCM off.  Chesley Mires, MD [place] 10/19/2017, 10:54 AM Pager:  [phone] After 3pm call: [phone]

## 2017-10-20 DIAGNOSIS — J810 Acute pulmonary edema: Secondary | ICD-10-CM

## 2017-10-20 DIAGNOSIS — I509 Heart failure, unspecified: Secondary | ICD-10-CM

## 2017-10-20 LAB — CBC
HEMATOCRIT: 34.4 % — AB (ref 36.0–46.0)
Hemoglobin: 10.6 g/dL — ABNORMAL LOW (ref 12.0–15.0)
MCH: 28.3 pg (ref 26.0–34.0)
MCHC: 30.8 g/dL (ref 30.0–36.0)
MCV: 91.7 fL (ref 78.0–100.0)
Platelets: 182 10*3/uL (ref 150–400)
RBC: 3.75 MIL/uL — ABNORMAL LOW (ref 3.87–5.11)
RDW: 14.4 % (ref 11.5–15.5)
WBC: 9.2 10*3/uL (ref 4.0–10.5)

## 2017-10-20 LAB — BASIC METABOLIC PANEL
Anion gap: 9 (ref 5–15)
BUN: 28 mg/dL — AB (ref 6–20)
CHLORIDE: 94 mmol/L — AB (ref 101–111)
CO2: 30 mmol/L (ref 22–32)
CREATININE: 0.86 mg/dL (ref 0.44–1.00)
Calcium: 8.8 mg/dL — ABNORMAL LOW (ref 8.9–10.3)
GFR calc Af Amer: >60 mL/min (ref >60)
GFR calc non Af Amer: 56 mL/min — ABNORMAL LOW (ref >60)
Glucose, Bld: 119 mg/dL — ABNORMAL HIGH (ref 65–99)
Potassium: 4.8 mmol/L (ref 3.5–5.1)
SODIUM: 133 mmol/L — AB (ref 135–145)

## 2017-10-20 LAB — GLUCOSE, CAPILLARY
Glucose-Capillary: 114 mg/dL — ABNORMAL HIGH (ref 65–99)
Glucose-Capillary: 127 mg/dL — ABNORMAL HIGH (ref 65–99)
Glucose-Capillary: 140 mg/dL — ABNORMAL HIGH (ref 65–99)
Glucose-Capillary: 168 mg/dL — ABNORMAL HIGH (ref 65–99)

## 2017-10-20 LAB — MAGNESIUM: MAGNESIUM: 1.8 mg/dL (ref 1.7–2.4)

## 2017-10-20 MED ORDER — FUROSEMIDE 10 MG/ML IJ SOLN
40.0000 mg | Freq: Once | INTRAMUSCULAR | Status: AC
  Administered 2017-10-20: 40 mg via INTRAVENOUS
  Filled 2017-10-20: qty 4

## 2017-10-20 MED ORDER — INSULIN ASPART 100 UNIT/ML ~~LOC~~ SOLN
0.0000 [IU] | Freq: Three times a day (TID) | SUBCUTANEOUS | Status: DC
  Administered 2017-10-20 (×2): 1 [IU] via SUBCUTANEOUS
  Administered 2017-10-22: 2 [IU] via SUBCUTANEOUS

## 2017-10-20 MED ORDER — FUROSEMIDE 40 MG PO TABS
40.0000 mg | ORAL_TABLET | Freq: Two times a day (BID) | ORAL | Status: DC
  Administered 2017-10-21 – 2017-10-22 (×4): 40 mg via ORAL
  Filled 2017-10-20 (×4): qty 1

## 2017-10-20 MED ORDER — INSULIN ASPART 100 UNIT/ML ~~LOC~~ SOLN: 0.0000 [IU] | Freq: Every day | SUBCUTANEOUS | Status: DC

## 2017-10-20 MED ORDER — PANTOPRAZOLE SODIUM 40 MG PO TBEC
40.0000 mg | DELAYED_RELEASE_TABLET | Freq: Two times a day (BID) | ORAL | Status: DC
  Administered 2017-10-20 – 2017-10-22 (×5): 40 mg via ORAL
  Filled 2017-10-20 (×5): qty 1

## 2017-10-20 MED ORDER — ALUM & MAG HYDROXIDE-SIMETH 200-200-20 MG/5ML PO SUSP
15.0000 mL | Freq: Four times a day (QID) | ORAL | Status: DC | PRN
  Filled 2017-10-20: qty 30

## 2017-10-20 MED ORDER — ENSURE ENLIVE PO LIQD
237.0000 mL | Freq: Two times a day (BID) | ORAL | Status: DC
  Administered 2017-10-20 – 2017-10-21 (×2): 237 mL via ORAL

## 2017-10-20 NOTE — Progress Notes (Signed)
Subjective:  Feels much better,  Still coughing up some phlegm.   Sitting on the chair.  Objective:  Vital Signs in the last 24 hours: Temp:  [97.9 F (36.6 C)-98.8 F (37.1 C)] 98.8 F (37.1 C) (03/11 2025) Pulse Rate:  [32-100] 86 (03/11 2000) Resp:  [12-32] 17 (03/11 1900) BP: (97-139)/(49-82) 126/82 (03/11 2000) SpO2:  [87 %-99 %] 96 % (03/11 2000) FiO2 (%):  [55 %] 55 % (03/11 0400) Weight:  [61.4 kg (135 lb 5.8 oz)] 61.4 kg (135 lb 5.8 oz) (03/11 0400)  Intake/Output from previous day: 03/10 0701 - 03/11 0700 In: 360 [P.O.:360] Out: 300 [Urine:300]  Physical Exam: Blood pressure 126/82, pulse 86, temperature 98.8 F (37.1 C), temperature source Oral, resp. rate 17, height 5\' 4"  (1.626 m), weight 61.4 kg (135 lb 5.8 oz), SpO2 96 %.  General appearance: alert, cooperative, appears stated age and no distress Eyes: negative findings: lids and lashes normal Neck: no adenopathy, no carotid bruit, no JVD, supple, symmetrical, trachea midline and thyroid not enlarged, symmetric, no tenderness/mass/nodules Neck: JVP - normal, carotids 2+= without bruits Resp: Bilateral coarse basal crackles present, right worse than the left. Chest Venturino: no tenderness Cardio: S1 Muffled with pansystolic murmur at the apex with radiation to the axilla.  S2 is normal.  No gallop appreciated. GI: soft, non-tender; bowel sounds normal; no masses,  no organomegaly Extremities: extremities normal, atraumatic, no cyanosis or edema  Lab Results: BMP Recent Labs    10/18/17 0246 10/18/17 1439 10/19/17 0336 10/20/17 0420  NA 132*  --  132* 133*  K 5.8* 5.5* 5.0 4.8  CL 100*  --  96* 94*  CO2 23  --  27 30  GLUCOSE 122*  --  103* 119*  BUN 18  --  30* 28*  CREATININE 0.80  --  1.09* 0.86  CALCIUM 8.5*  --  8.6* 8.8*  GFRNONAA >60  --  42* 56*  GFRAA >60  --  49* >60    CBC Recent Labs  Lab 10/16/17 2150  10/20/17 0420  WBC 18.7*   < > 9.2  RBC 5.03   < > 3.75*  HGB 14.6   < > 10.6*   HCT 46.3*   < > 34.4*  PLT 248   < > 182  MCV 92.0   < > 91.7  MCH 29.0   < > 28.3  MCHC 31.5   < > 30.8  RDW 14.0   < > 14.4  LYMPHSABS 4.5*  --   --   MONOABS 2.1*  --   --   EOSABS 0.2  --   --   BASOSABS 0.0  --   --    < > = values in this interval not displayed.    HEMOGLOBIN A1C Lab Results  Component Value Date   HGBA1C 5.7 (H) 07/01/2017   MPG 116.89 07/01/2017   Lipid Panel     Component Value Date/Time   CHOL 204 (A) 02/23/2016   TRIG 103 02/23/2016   HDL 72 (A) 02/23/2016   LDLCALC 111 02/23/2016  NHDL chol 93.    Hepatic Function Panel Recent Labs    10/16/17 2150  PROT 7.4  ALBUMIN 4.0  AST 45*  ALT 26  ALKPHOS 89  BILITOT 0.7    Imaging: Dg Chest Port 1 View  Result Date: 10/19/2017 CLINICAL DATA:  CHF EXAM: PORTABLE CHEST 1 VIEW COMPARISON:  10/18/2017 FINDINGS: Cardiomegaly. Moderate bilateral pleural effusions. Diffuse bilateral airspace disease again noted,  not significantly changed. IMPRESSION: Continued diffuse bilateral airspace disease which could reflect edema or infection. Stable moderate effusions. Electronically Signed   By: Rolm Baptise M.D.   On: 10/19/2017 07:16    Cardiac Studies: Echocardiogram 10/17/2017: Normal LV size, mild LVH, normal LVEF at 60-65%, no Gut motion abnormality.  Grade 2 diastolic dysfunction.  Mildly thickened mitral valve leaflet.  Mild mitral annular calcification.  Moderate to severe MR.  Moderate left atrial enlargement.  RV is mildly dilated with normal function.  Moderate to severe TR with severe pulmonary hypertension, PASP 103 mmHg.  EKG 10/16/2017: Sinus tachycardia at the rate of 103 bpm.  Biatrial enlargement.  Nonspecific ST abnormality.  Compared to 01/30/2017, sinus tachycardia new, nonspecific ST abnormalities new.  Scheduled Meds: . arformoterol  15 mcg Nebulization BID  . aspirin EC  81 mg Oral Once per day on Mon Thu  . bisoprolol  2.5 mg Oral Daily  . budesonide (PULMICORT) nebulizer  solution  0.5 mg Nebulization BID  . clopidogrel  75 mg Oral Daily  . feeding supplement (ENSURE ENLIVE)  237 mL Oral BID BM  . fluticasone  1 spray Each Nare Daily  . heparin  5,000 Units Subcutaneous Q8H  . insulin aspart  0-5 Units Subcutaneous QHS  . insulin aspart  0-9 Units Subcutaneous TID WC  . isosorbide-hydrALAZINE  1 tablet Oral BID  . mouth rinse  15 mL Mouth Rinse BID  . pantoprazole  40 mg Oral BID AC  . sodium chloride  2 spray Each Nare BID  . tobramycin-dexamethasone   Both Eyes QHS  . tobramycin-dexamethasone  1 drop Both Eyes Q6H   Continuous Infusions: PRN Meds:.acetaminophen, alum & mag hydroxide-simeth, ipratropium, ondansetron (ZOFRAN) IV   Assessment/Plan:  1.  Acute diastolic heart failure secondary to valvular heart disease. 2.  Acute pulmonary edema resolved.  3.  Severe MR by physical examination.  Abnormal echocardiogram evaluated. 4.  Moderate to severe TR and severe PH, suspect secondary pulmonary hypertension. 5.  Hypertension 6.  Asthmatic bronchitis 7. History of TIA sometime in 2016, being managed in [facility].  No detailed history available. 8. Hyperkalemia. 9. Hyperglycemia 10. Mild hyperlipidemia.  Recommendation: I have started Furosemide 40 mg PO BID.  She is tolerating bisoprolol at a low dose but has significant bronchospasm history and also has mild bilateral expiratory wheeze. Continue los dose BIsoprolol for now. Tolerating BiDil 1 p.o. 3 times daily for CHF.  ACE inhibitor/ARB contraindicated due to hyperkalemia.  Okay to transfer her out to tele. Will hold off on cardiac testing until she recovers well. Plan TEE at a later date depending upon her recovery and candidacy for MV repair    Adrian Prows, M.D. 10/20/2017, 8:31 PM [facility] Pager: [phone] Office: [phone] If no answer: [phone]

## 2017-10-20 NOTE — Progress Notes (Signed)
[place] TEAM 1 - Stepdown/ICU TEAM  [name]  GUY:[id number] DOB: 13-Apr-1921 DOA: 10/16/2017 PCP: [name], NP    Brief Narrative:  84 yo female w/ a hx of asthma, colitis, colon cancer, DM, DVT, HTN, GERD, Hiatal hernia, Nephrolithiasis, HTN, and HLD who presented to [facility] with nausea, dry heaves, and dyspnea.  She was hypoxic in ER and required intubation.  She was found to have CHF with acute pulmonary edema and new valvular heart disease.    Significant Events: 3/08 Admit - intubated - extubated  3/09 cardiology consulted 3/10 transfer to SDU  Subjective: Alert and pleasant.  C/o indigestion typical for her HH/GERD, but reports it is more severe of late.  Denies sob, CP, or HA.    Assessment & Plan:  Acute hypoxic and hypercarbic respiratory failure due to acute pulmonary edema Cont to diurese as per Cards   Acute on chronic diastolic CHF - Moderate to severe mitral regurgitation - WHO group 2 pulmonary hypertension Appears to be better compensated at present - Cards directing care of this issue   Upper airway cough syndrome - Asthmatic bronchitis  nasal irrigation, flonase - improved at time of visit today   DM2 CBG well controlled at this time   Hyperkalemia Resolved  Hypophosphatemia Check Phos in AM   Mild thrombocytopenia Resolving spontaneously   Deconditioning had recent hip surgery - PT/OT to eval/follow   DVT prophylaxis: SQ heparin  Code Status: FULL CODE Family Communication: no family present at time of exam  Disposition Plan: SDU   Consultants:  Cardiology - [place]  Antimicrobials:  Vancomycin 3/08 > 3/08 Cefepime 3/08 > 3/08  Objective: Blood pressure (!) 139/49, pulse 100, temperature 98.1 F (36.7 C), temperature source Oral, resp. rate 18, height 5\' 4"  (1.626 m), weight 61.4 kg (135 lb 5.8 oz), SpO2 96 %.  Intake/Output Summary (Last 24 hours) at 10/20/2017 1105 Last data filed at 10/20/2017 0800 Gross per 24 hour  Intake  270 ml  Output 300 ml  Net -30 ml   Filed Weights   10/18/17 0629 10/19/17 0500 10/20/17 0400  Weight: 62.4 kg (137 lb 9.1 oz) 62.9 kg (138 lb 10.7 oz) 61.4 kg (135 lb 5.8 oz)    Examination: General: No acute respiratory distress Lungs: fine diffuse crackles - no wheezing  Cardiovascular: RRR - occasional ectopic beats - systolic M 3/6 Abdomen: Nontender, nondistended, soft, bowel sounds positive, no rebound, no ascites, no appreciable mass Extremities: No significant cyanosis, clubbing, or edema bilateral lower extremities  CBC: Recent Labs  Lab 10/16/17 2150 10/17/17 0611 10/18/17 0246 10/19/17 0336 10/20/17 0420  WBC 18.7* 14.7* 24.2* 10.8* 9.2  NEUTROABS 11.9*  --   --   --   --   HGB 14.6 12.2 12.6 10.2* 10.6*  HCT 46.3* 38.5 39.8 31.6* 34.4*  MCV 92.0 91.4 92.6 92.9 91.7  PLT 248 165 239 139* 563   Basic Metabolic Panel: Recent Labs  Lab 10/17/17 0611 10/17/17 2145 10/18/17 0246 10/18/17 1439 10/19/17 0336 10/20/17 0420  NA 136 131* 132*  --  132* 133*  K 3.3* 5.5* 5.8* 5.5* 5.0 4.8  CL 101 100* 100*  --  96* 94*  CO2 21* 24 23  --  27 30  GLUCOSE 239* 179* 122*  --  103* 119*  BUN 16 16 18   --  30* 28*  CREATININE 0.97 0.75 0.80  --  1.09* 0.86  CALCIUM 8.0* 8.3* 8.5*  --  8.6* 8.8*  MG  1.6* 2.1 2.0  --   --  1.8  PHOS 2.6 5.1* 4.1  --   --   --    GFR: Estimated Creatinine Clearance: 34.5 mL/min (by C-G formula based on SCr of 0.86 mg/dL).  Liver Function Tests: Recent Labs  Lab 10/16/17 2150  AST 45*  ALT 26  ALKPHOS 89  BILITOT 0.7  PROT 7.4  ALBUMIN 4.0   HbA1C: Hemoglobin A1C  Date/Time Value Ref Range Status  02/23/2016 5.3  Final   Hgb A1c MFr Bld  Date/Time Value Ref Range Status  07/01/2017 02:57 PM 5.7 (H) 4.8 - 5.6 % Final    Comment:    (NOTE) Pre diabetes:          5.7%-6.4% Diabetes:              >6.4% Glycemic control for   <7.0% adults with diabetes     CBG: Recent Labs  Lab 10/19/17 0823 10/19/17 1201  10/19/17 1619 10/19/17 1932 10/20/17 0716  GLUCAP 99 113* 156* 114* 114*    Recent Results (from [facility] past 240 hour(s))  Blood Culture (routine x 2)     Status: None (Preliminary result)   Collection Time: 10/16/17 10:22 PM  Result Value Ref Range Status   Specimen Description BLOOD RIGHT HAND  Final   Special Requests IN PEDIATRIC BOTTLE Blood Culture adequate volume  Final   Culture   Final    NO GROWTH 2 DAYS Performed at [facility], [place] [address] [place], [place] [address]    Report Status PENDING  Incomplete  Blood Culture (routine x 2)     Status: None (Preliminary result)   Collection Time: 10/16/17 10:25 PM  Result Value Ref Range Status   Specimen Description BLOOD LEFT ANTECUBITAL  Final   Special Requests AEROBIC BOTTLE ONLY Blood Culture adequate volume  Final   Culture   Final    NO GROWTH 2 DAYS Performed at [facility], [place] [address] [place], [place] [address]    Report Status PENDING  Incomplete  Respiratory Panel by PCR     Status: None   Collection Time: 10/17/17  1:11 AM  Result Value Ref Range Status   Adenovirus NOT DETECTED NOT DETECTED Final   Coronavirus 229E NOT DETECTED NOT DETECTED Final   Coronavirus HKU1 NOT DETECTED NOT DETECTED Final   Coronavirus NL63 NOT DETECTED NOT DETECTED Final   Coronavirus OC43 NOT DETECTED NOT DETECTED Final   Metapneumovirus NOT DETECTED NOT DETECTED Final   Rhinovirus / Enterovirus NOT DETECTED NOT DETECTED Final   Influenza A NOT DETECTED NOT DETECTED Final   Influenza B NOT DETECTED NOT DETECTED Final   Parainfluenza Virus 1 NOT DETECTED NOT DETECTED Final   Parainfluenza Virus 2 NOT DETECTED NOT DETECTED Final   Parainfluenza Virus 3 NOT DETECTED NOT DETECTED Final   Parainfluenza Virus 4 NOT DETECTED NOT DETECTED Final   Respiratory Syncytial Virus NOT DETECTED NOT DETECTED Final   Bordetella pertussis NOT DETECTED NOT DETECTED Final   Chlamydophila pneumoniae NOT DETECTED NOT DETECTED Final    Mycoplasma pneumoniae NOT DETECTED NOT DETECTED Final    Comment: Performed at [facility], [place] [address], [place], [place] [address]  MRSA PCR Screening     Status: None   Collection Time: 10/17/17  3:14 AM  Result Value Ref Range Status   MRSA by PCR NEGATIVE NEGATIVE Final    Comment:        [facility] GeneXpert  MRSA Assay (FDA approved for NASAL specimens only), is one component of a comprehensive MRSA colonization surveillance program. It is not intended to diagnose MRSA infection nor to guide or monitor treatment for MRSA infections. Performed at [facility], [place] [address] [place], [place] [address]      Scheduled Meds: . arformoterol  15 mcg Nebulization BID  . aspirin EC  81 mg Oral Once per day on Mon Thu  . bisoprolol  2.5 mg Oral Daily  . budesonide (PULMICORT) nebulizer solution  0.5 mg Nebulization BID  . clopidogrel  75 mg Oral Daily  . fluticasone  1 spray Each Nare Daily  . heparin  5,000 Units Subcutaneous Q8H  . insulin aspart  0-9 Units Subcutaneous Q4H  . isosorbide-hydrALAZINE  1 tablet Oral BID  . mouth rinse  15 mL Mouth Rinse BID  . pantoprazole  40 mg Oral Daily  . sodium chloride  2 spray Each Nare BID  . tobramycin-dexamethasone   Both Eyes QHS  . tobramycin-dexamethasone  1 drop Both Eyes Q6H   Continuous Infusions: . sodium chloride       LOS: 3 days   Cherene Altes, MD [facility] Office  [phone] Pager - Text Page per Amion as per below:  On-Call/Text Page:      [email or web address]      password [email or web address]  If 7PM-7AM, please contact night-coverage [email or web address] Password [email or web address] 10/20/2017, 11:05 AM

## 2017-10-20 NOTE — Evaluation (Signed)
Physical Therapy Evaluation Patient Details Name: [name] MRN: [id number] DOB: 04-22-1921 Today's Date: 10/20/2017   History of Present Illness  84 yo female w/ a hx of asthma, colitis, colon cancer, DM, DVT, HTN, GERD, Hiatal hernia, Nephrolithiasis, HTN, and HLD who presented to the ER with nausea, dry heaves, and dyspnea.  She was hypoxic in ER and required intubation.  She was found to have CHF with acute pulmonary edema and new valvular heart disease.    Clinical Impression  Patient presents with decreased independence with mobility due to deficits listed in PT problem list.  She will benefit from skilled PT in the acute setting prior to d/c home with intermittent family assist.  Currently she needs min A for short distance ambulation with walker, previously was independent with walker and all ADL's.    Follow Up Recommendations SNF;Supervision/Assistance - 24 hour(prefers [facility])    Equipment Recommendations  None recommended by PT    Recommendations for Other Services       Precautions / Restrictions Precautions Precautions: Fall      Mobility  Bed Mobility               General bed mobility comments: up in chair  Transfers Overall transfer level: Needs assistance Equipment used: Rolling walker (2 wheeled) Transfers: Sit to/from Stand Sit to Stand: Min assist         General transfer comment: initially with some lifting help, then second time balance assist only  Ambulation/Gait Ambulation/Gait assistance: Min assist;+2 safety/equipment(son followed with chair) Ambulation Distance (Feet): 25 Feet(x 2) Assistive device: Rolling walker (2 wheeled) Gait Pattern/deviations: Step-through pattern;Decreased stride length     General Gait Details: slow, but steady with RW, occasional S level when managing O2 tank, etc  Stairs            Wheelchair Mobility    Modified Rankin (Stroke Patients Only)       Balance Overall balance  assessment: Needs assistance   Sitting balance-Leahy Scale: Fair     Standing balance support: Bilateral upper extremity supported Standing balance-Leahy Scale: Poor                               Pertinent Vitals/Pain Pain Assessment: No/denies pain    Home Living Family/patient expects to be discharged to:: Skilled nursing facility Living Arrangements: Alone Available Help at Discharge: Family;Available PRN/intermittently Type of Home: Independent living facility([facility]) Home Access: Level entry     Home Layout: One level Home Equipment: Walker - 2 wheels;Walker - 4 wheels;Grab bars - toilet;Grab bars - tub/shower;Shower seat;Hand held shower head      Prior Function Level of Independence: Independent with assistive device(s)         Comments: Had not started back to driving post THA, but was living at ILF with intermittent support from family,      Hand Dominance        Extremity/Trunk Assessment   Upper Extremity Assessment Upper Extremity Assessment: Defer to OT evaluation    Lower Extremity Assessment Lower Extremity Assessment: LLE deficits/detail;RLE deficits/detail RLE Deficits / Details: WFL numbness to knees  RLE Sensation: history of peripheral neuropathy LLE Deficits / Details: WFL except hip flexion strength 2+/5 LLE Sensation: history of peripheral neuropathy    Cervical / Trunk Assessment Cervical / Trunk Assessment: Kyphotic  Communication   Communication: HOH  Cognition Arousal/Alertness: Awake/alert Behavior During Therapy: WFL for tasks assessed/performed Overall Cognitive Status:  Within Functional Limits for tasks assessed                                        General Comments General comments (skin integrity, edema, etc.): maintained on 4L O2 SpO2 94%    Exercises     Assessment/Plan    PT Assessment Patient needs continued PT services  PT Problem List Decreased strength;Decreased  mobility;Decreased balance;Decreased activity tolerance;Cardiopulmonary status limiting activity       PT Treatment Interventions DME instruction;Therapeutic activities;Gait training;Therapeutic exercise;Patient/family education;Balance training;Functional mobility training    PT Goals (Current goals can be found in the Care Plan section)  Acute Rehab PT Goals Patient Stated Goal: To go to rehab prior to back home PT Goal Formulation: With patient/family Time For Goal Achievement: 11/03/17 Potential to Achieve Goals: Good    Frequency Min 3X/week   Barriers to discharge        Co-evaluation               AM-PAC PT "6 Clicks" Daily Activity  Outcome Measure Difficulty turning over in bed (including adjusting bedclothes, sheets and blankets)?: A Little Difficulty moving from lying on back to sitting on the side of the bed? : A Lot Difficulty sitting down on and standing up from a chair with arms (e.g., wheelchair, bedside commode, etc,.)?: Unable Help needed moving to and from a bed to chair (including a wheelchair)?: A Little Help needed walking in hospital room?: A Little Help needed climbing 3-5 steps with a railing? : A Lot 6 Click Score: 14    End of Session Equipment Utilized During Treatment: Gait belt;Oxygen Activity Tolerance: Patient tolerated treatment well Patient left: with call bell/phone within reach;in chair;with family/visitor present Nurse Communication: Mobility status PT Visit Diagnosis: Other abnormalities of gait and mobility (R26.89);Muscle weakness (generalized) (M62.81)    Time: 4580-9983 PT Time Calculation (min) (ACUTE ONLY): 33 min   Charges:   PT Evaluation $PT Eval Moderate Complexity: 1 Mod PT Treatments $Gait Training: 8-22 mins   PT G CodesMagda Sexton, [place] [phone] 10/20/2017   [name] 10/20/2017, 1:22 PM

## 2017-10-21 DIAGNOSIS — I5033 Acute on chronic diastolic (congestive) heart failure: Secondary | ICD-10-CM

## 2017-10-21 LAB — CBC
HCT: 33.2 % — ABNORMAL LOW (ref 36.0–46.0)
Hemoglobin: 10.4 g/dL — ABNORMAL LOW (ref 12.0–15.0)
MCH: 28.7 pg (ref 26.0–34.0)
MCHC: 31.3 g/dL (ref 30.0–36.0)
MCV: 91.5 fL (ref 78.0–100.0)
PLATELETS: 203 10*3/uL (ref 150–400)
RBC: 3.63 MIL/uL — AB (ref 3.87–5.11)
RDW: 14.3 % (ref 11.5–15.5)
WBC: 8.2 10*3/uL (ref 4.0–10.5)

## 2017-10-21 LAB — COMPREHENSIVE METABOLIC PANEL
ALK PHOS: 75 U/L (ref 38–126)
ALT: 46 U/L (ref 14–54)
AST: 24 U/L (ref 15–41)
Albumin: 2.7 g/dL — ABNORMAL LOW (ref 3.5–5.0)
Anion gap: 10 (ref 5–15)
BILIRUBIN TOTAL: 0.9 mg/dL (ref 0.3–1.2)
BUN: 24 mg/dL — ABNORMAL HIGH (ref 6–20)
CALCIUM: 9 mg/dL (ref 8.9–10.3)
CHLORIDE: 91 mmol/L — AB (ref 101–111)
CO2: 34 mmol/L — ABNORMAL HIGH (ref 22–32)
CREATININE: 0.83 mg/dL (ref 0.44–1.00)
GFR, EST NON AFRICAN AMERICAN: 59 mL/min — AB (ref >60)
Glucose, Bld: 107 mg/dL — ABNORMAL HIGH (ref 65–99)
Potassium: 4.2 mmol/L (ref 3.5–5.1)
Sodium: 135 mmol/L (ref 135–145)
Total Protein: 5.5 g/dL — ABNORMAL LOW (ref 6.5–8.1)

## 2017-10-21 LAB — GLUCOSE, CAPILLARY
GLUCOSE-CAPILLARY: 102 mg/dL — AB (ref 65–99)
GLUCOSE-CAPILLARY: 116 mg/dL — AB (ref 65–99)
Glucose-Capillary: 112 mg/dL — ABNORMAL HIGH (ref 65–99)
Glucose-Capillary: 109 mg/dL — ABNORMAL HIGH (ref 65–99)
Glucose-Capillary: 131 mg/dL — ABNORMAL HIGH (ref 65–99)

## 2017-10-21 LAB — PHOSPHORUS: PHOSPHORUS: 4.5 mg/dL (ref 2.5–4.6)

## 2017-10-21 MED ORDER — BISACODYL 10 MG RE SUPP: 10.0000 mg | Freq: Every day | RECTAL | Status: DC | PRN

## 2017-10-21 MED ORDER — POLYETHYLENE GLYCOL 3350 17 G PO PACK
17.0000 g | PACK | Freq: Every day | ORAL | Status: DC
  Administered 2017-10-21 – 2017-10-22 (×2): 17 g via ORAL
  Filled 2017-10-21 (×2): qty 1

## 2017-10-21 MED ORDER — SENNOSIDES-DOCUSATE SODIUM 8.6-50 MG PO TABS
1.0000 | ORAL_TABLET | Freq: Two times a day (BID) | ORAL | Status: DC
  Administered 2017-10-21 – 2017-10-22 (×3): 1 via ORAL
  Filled 2017-10-21 (×3): qty 1

## 2017-10-21 MED ORDER — POLYVINYL ALCOHOL 1.4 % OP SOLN
1.0000 [drp] | OPHTHALMIC | Status: DC | PRN
  Administered 2017-10-21: 1 [drp] via OPHTHALMIC
  Filled 2017-10-21: qty 15

## 2017-10-21 NOTE — Progress Notes (Signed)
CSW met with patient and son, Shanon Brow, at bedside and gave SNF bed offers. Patient's preferred SNF, [facility], is not able to offer a bed today, but may have availability tomorrow; CSW to follow up. CSW gave bed offers so far to son and advised to choose an alternate facility in case [facility] continues to not have a bed available.  Estanislado Emms, [place]

## 2017-10-21 NOTE — Clinical Social Work Note (Signed)
Clinical Social Work Assessment  Patient Details  Name: [name] MRN: [id number] Date of Birth: February 28, 1921  Date of referral:  10/21/17               Reason for consult:  Facility Placement                Permission sought to share information with:  Facility Sport and exercise psychologist, Family Supports Permission granted to share information::  Yes, Verbal Permission Granted  Name::     Lilyan Gilford  Agency::  SNFs  Relationship::  daughter  Contact Information:  [phone]  Housing/Transportation Living arrangements for the past 2 months:  [place] of Information:  Patient Patient Interpreter Needed:  None Criminal Activity/Legal Involvement Pertinent to Current Situation/Hospitalization:  No - Comment as needed Significant Relationships:  Adult Children Lives with:  Self Do you feel safe going back to the place where you live?  Yes Need for family participation in patient care:  Yes (Comment)  Care giving concerns: Patient from [place]. PT recommending SNF.    Social Worker assessment / plan:  CSW met with patient at bedside. Patient alert and oriented. Patient went to rehab most recently in November 2018, then stayed at her daughter's house over the holidays, and returned to her ILF in early January. Patient is agreeable to SNF again and prefers [facility]. CSW sent out initial referrals and spoke to [facility] admission. They are reviewing referral and will follow up.   Patient indicated her son and daughter will be visiting her at the hospital this afternoon and they can also help with paperwork at [facility] if needed. CSW to follow when bed offers confirmed and support with discharge to SNF.  Employment status:  Retired Forensic scientist:  [employer] PT Recommendations:  [place] / Referral to community resources:  [place]  Patient/Family's Response to care: Patient appreciative of  care.  Patient/Family's Understanding of and Emotional Response to Diagnosis, Current Treatment, and Prognosis: Patient with understanding of her condition and agreeable to SNF for rehab.  Emotional Assessment Appearance:  Appears stated age Attitude/Demeanor/Rapport:  Engaged Affect (typically observed):  Calm, Pleasant Orientation:  Oriented to Self, Oriented to Place, Oriented to  Time, Oriented to Situation Alcohol / Substance use:  Not Applicable Psych involvement (Current and /or in the community):  No (Comment)  Discharge Needs  Concerns to be addressed:  Discharge Planning Concerns, Care Coordination Readmission within the last 30 days:  No Current discharge risk:  Physical Impairment Barriers to Discharge:  Continued Medical Work up   Estanislado Emms, LCSW 10/21/2017, 10:47 AM

## 2017-10-21 NOTE — [place] FL2 (Signed)
[place] MEDICAID FL2 LEVEL OF CARE SCREENING TOOL     IDENTIFICATION  Patient Name: [name] Birthdate: [birth year] Sex: female Admission Date (Current Location): 10/16/2017  [facility] and [place] Number:  Herbalist and Address:  [facility] [place], [place] [address] [place], [place] [address]      Provider Number: [id number]  Attending Physician Name and Address:  Cherene Altes, MD  Relative Name and Phone Number:  Lilyan Gilford, daughter, [phone]    Current Level of Care: Hospital Recommended Level of Care: [place] Prior Approval Number:    Date Approved/Denied:   PASRR Number: [id number]  Discharge Plan: SNF    Current Diagnoses: Patient Active Problem List   Diagnosis Date Noted  . Acute respiratory failure ([place]) 10/17/2017  . Status post total replacement of right hip 07/11/2017  . Pain of right hip joint 05/05/2017  . DOE (dyspnea on exertion) 02/21/2017  . Asthma, chronic, moderate persistent, uncomplicated 35/59/7416  . Chronic respiratory failure with hypoxia and hypercapnia ([place]) 02/21/2017  . Hypertensive urgency 01/30/2017  . Dizziness 01/30/2017  . Hyponatremia with extracellular fluid depletion 01/30/2017  . Hyponatremia 01/30/2017  . Lumbar spondylosis 01/28/2017  . Unilateral primary osteoarthritis, right hip 01/28/2017  . Arthritis   . Asthma   . Cataract   . Colon cancer ([place])   . COPD (chronic obstructive pulmonary disease) ([place])   . Diabetes mellitus without complication ([place])   . Diverticulitis   . DVT (deep vein thrombosis) in pregnancy ([place])   . Essential hypertension   . GERD (gastroesophageal reflux disease)   . Hearing impaired   . Hyperlipidemia   . Hypertension   . TIA (transient ischemic attack)   . Seasonal allergies     Orientation RESPIRATION BLADDER Height & Weight     Self, Time, Situation, Place  O2, Normal(nasal cannula 6L) Continent, External catheter Weight: 136  lb 0.4 oz (61.7 kg) Height:  5\' 4"  (162.6 cm)  BEHAVIORAL SYMPTOMS/MOOD NEUROLOGICAL BOWEL NUTRITION STATUS      Continent Diet(please see DC summary)  AMBULATORY STATUS COMMUNICATION OF NEEDS Skin   Limited Assist Verbally Normal                       Personal Care Assistance Level of Assistance  Bathing, Feeding, Dressing Bathing Assistance: Limited assistance Feeding assistance: Independent Dressing Assistance: Limited assistance     Functional Limitations Info  Sight, Hearing, Speech Sight Info: Adequate Hearing Info: Adequate Speech Info: Adequate    SPECIAL CARE FACTORS FREQUENCY  PT (By licensed PT), OT (By licensed OT)     PT Frequency: 5x/week OT Frequency: 5x/week            Contractures Contractures Info: Not present    Additional Factors Info  Code Status, Allergies, Insulin Sliding Scale Code Status Info: Full Allergies Info: Albuterol, Prochlorperazine, Celecoxib, Codeine, Fish Oil, Fosamax Alendronate Sodium, Morphine And Related, Moxifloxacin, Oxycodone, Pregabalin, Propoxyphene, Statins, Aspirin-dipyridamole Er, Buprenorphine Hcl   Insulin Sliding Scale Info: insulin 3x/day with meals and at bedtime       Current Medications (10/21/2017):  This is the current hospital active medication list Current Facility-Administered Medications  Medication Dose Route Frequency Provider Last Rate Last Dose  . acetaminophen (TYLENOL) tablet 650 mg  650 mg Oral Q4H PRN Hammonds, Sharyn Blitz, MD      . alum & mag hydroxide-simeth (MAALOX/MYLANTA) 200-200-20 MG/5ML suspension 15-30 mL  15-30 mL Oral Q6H PRN McClung,  Kimberlee Nearing, MD      . arformoterol [facility]) nebulizer solution 15 mcg  15 mcg Nebulization BID Hammonds, Sharyn Blitz, MD   15 mcg at 10/21/17 0907  . aspirin EC tablet 81 mg  81 mg Oral Once per day on Mon Thu Sood, Elisabeth Cara, MD   81 mg at 10/20/17 0959  . bisoprolol (ZEBETA) tablet 2.5 mg  2.5 mg Oral Daily Adrian Prows, MD   2.5 mg at 10/20/17 1009  .  budesonide (PULMICORT) nebulizer solution 0.5 mg  0.5 mg Nebulization BID Hammonds, Sharyn Blitz, MD   0.5 mg at 10/21/17 0909  . clopidogrel (PLAVIX) tablet 75 mg  75 mg Oral Daily Chesley Mires, MD   75 mg at 10/20/17 1009  . feeding supplement (ENSURE ENLIVE) (ENSURE ENLIVE) liquid 237 mL  237 mL Oral BID BM Collene Gobble, MD   237 mL at 10/20/17 1816  . fluticasone (FLONASE) 50 MCG/ACT nasal spray 1 spray  1 spray Each Nare Daily Chesley Mires, MD   1 spray at 10/20/17 1010  . furosemide (LASIX) tablet 40 mg  40 mg Oral BID Adrian Prows, MD      . heparin injection 5,000 Units  5,000 Units Subcutaneous Q8H Hammonds, Sharyn Blitz, MD   5,000 Units at 10/21/17 0601  . insulin aspart (novoLOG) injection 0-5 Units  0-5 Units Subcutaneous QHS Joette Catching T, MD      . insulin aspart (novoLOG) injection 0-9 Units  0-9 Units Subcutaneous TID WC Cherene Altes, MD   1 Units at 10/20/17 1815  . ipratropium (ATROVENT) nebulizer solution 0.5 mg  0.5 mg Nebulization Q4H PRN Omar Person, NP      . isosorbide-hydrALAZINE (BIDIL) 20-37.5 MG per tablet 1 tablet  1 tablet Oral BID Adrian Prows, MD   1 tablet at 10/20/17 2255  . MEDLINE mouth rinse  15 mL Mouth Rinse BID Hammonds, Sharyn Blitz, MD   15 mL at 10/20/17 2257  . ondansetron (ZOFRAN) injection 4 mg  4 mg Intravenous Q6H PRN Hammonds, Sharyn Blitz, MD   4 mg at 10/18/17 0406  . pantoprazole (PROTONIX) EC tablet 40 mg  40 mg Oral BID AC Cherene Altes, MD   40 mg at 10/20/17 1400  . sodium chloride (OCEAN) 0.65 % nasal spray 2 spray  2 spray Each Nare BID Chesley Mires, MD   2 spray at 10/20/17 2257  . tobramycin-dexamethasone (TOBRADEX) ophthalmic ointment   Both Eyes QHS Sood, Vineet, MD      . tobramycin-dexamethasone [facility]) ophthalmic suspension 1 drop  1 drop Both Eyes Q6H Chesley Mires, MD   1 drop at 10/21/17 0602     Discharge Medications: Please see discharge summary for a list of discharge medications.  Relevant Imaging  Results:  Relevant Lab Results:   Additional Information SS: [id number]  Estanislado Emms, LCSW

## 2017-10-21 NOTE — Progress Notes (Signed)
OT Cancellation Note  Patient Details Name: [name] MRN: [id number] DOB: 1921/07/29   Cancelled Treatment:    Reason Eval/Treat Not Completed: Other (comment): OT screened. Pt is Medicare and current D/C plan is SNF. No apparent immediate acute care OT needs, therefore will defer OT to SNF. If OT eval is needed please call Acute Rehab Dept. at [phone] or text page OT at number below.  Norman Herrlich, MS OTR/L  Pager: [phone]    Norman Herrlich 10/21/2017, 4:08 PM

## 2017-10-21 NOTE — Plan of Care (Signed)
Pt ambulates from bed to [facility] with front wheel walker and standby assistance. Bed alarm implemented for safety

## 2017-10-21 NOTE — Progress Notes (Addendum)
Subjective:  Feels well Breathing improved  Objective:  Vital Signs in the last 24 hours: Temp:  [97.7 F (36.5 C)-98.8 F (37.1 C)] 98.1 F (36.7 C) (03/12 1340) Pulse Rate:  [86-104] 92 (03/12 0329) Resp:  [12-18] 14 (03/12 0329) BP: (102-141)/(58-82) 102/58 (03/12 1340) SpO2:  [94 %-99 %] 94 % (03/12 1340) Weight:  [61.7 kg (136 lb 0.4 oz)] 61.7 kg (136 lb 0.4 oz) (03/12 0329)  Intake/Output from previous day: 03/11 0701 - 03/12 0700 In: 710 [P.O.:710] Out: 1850 [Urine:1850] Intake/Output from this shift: No intake/output data recorded.  Net -1.1 L  Physical Exam:  General appearance: alert, cooperative, appears stated age and no distress Eyes: negative findings: lids and lashes normal Neck: no adenopathy, no carotid bruit, no JVD, supple, symmetrical, trachea midline and thyroid not enlarged, symmetric, no tenderness/mass/nodules Neck: JVP - normal, carotids 2+ without bruits Resp: Faint bibasilar rales.  Chest [place]iman: no tenderness Cardio: S1 Muffled with holosystolic murmur at the apex with radiation to the axilla.  S2 is normal.  No gallop appreciated. GI: soft, non-tender; bowel sounds normal; no masses,  no organomegaly Extremities: extremities normal, atraumatic, no cyanosis or edema     Lab Results: Recent Labs    10/20/17 0420 10/21/17 0357  WBC 9.2 8.2  HGB 10.6* 10.4*  PLT 182 203   Recent Labs    10/20/17 0420 10/21/17 0357  NA 133* 135  K 4.8 4.2  CL 94* 91*  CO2 30 34*  GLUCOSE 119* 107*  BUN 28* 24*  CREATININE 0.86 0.83   Hepatic Function Panel Recent Labs    10/21/17 0357  PROT 5.5*  ALBUMIN 2.7*  AST 24  ALT 46  ALKPHOS 75  BILITOT 0.9    Cardiac Studies: Echocardiogram 10/17/2017:Normal LV size, mild LVH, normal LVEF at 60-65%, no [place]ll motion abnormality. Grade 2 diastolic dysfunction. Mildly thickened mitral valve leaflet. Mild mitral annular calcification. Moderate to severe MR. Moderate left atrial enlargement.  RV is mildly dilated with normal function. Moderate to severe TR with severe pulmonary hypertension, PASP 103 mmHg.  EKG 10/16/2017: Sinus tachycardia at the rate of 103 bpm. Biatrial enlargement. Nonspecific ST abnormality. Compared to 01/30/2017, sinus tachycardia new, nonspecific ST abnormalities new.   Assessment: 84 year old Caucasian female  Acute on chronic diastolic heart failure secondary to valvular heart disease: Now resolving Severe mitral regurgitation: Suspect flail posterior leaflet Severe pulonary hypertension HypertensionH/o asthma H/o TIA  Recommendations: Improved volume status, diuresing well. Tolerating bisoprolol and bidil. Continue Lasix 40 mg PO bid.  Should be okay to discharge tomorrow. Will setup outpatient follow up and consider outpatient TEE and surgical consult.   LOS: 4 days    Manish J Patwardhan 10/21/2017, 3:53 PM  [place], MD [facility]. [place] Pager: [phone] Office: [phone] If no answer Cell [phone]

## 2017-10-21 NOTE — Plan of Care (Signed)
  Education: Knowledge of General Education information will improve 10/21/2017 0157 - Progressing by Claudine Mouton, RN   Pt. Oriented to unit and method of reporting concerns, call light at beside.

## 2017-10-21 NOTE — Progress Notes (Signed)
[place] TEAM 1 - Stepdown/ICU TEAM  DARCELLA SHIFFMAN  YHC:[id number] DOB: 1921-01-06 DOA: 10/16/2017 PCP: [name], NP    Brief Narrative:  84 yo female w/ a hx of asthma, colitis, colon cancer, DM, DVT, HTN, GERD, Hiatal hernia, Nephrolithiasis, HTN, and HLD who presented to the ER with nausea, dry heaves, and dyspnea.  She was hypoxic in ER and required intubation.  She was found to have CHF with acute pulmonary edema and new valvular heart disease.    Significant Events: 3/08 Admit - intubated - extubated  3/09 Cardiology consulted 3/10 transfer to SDU  Subjective: The patient is sitting up in a bedside chair.  She denies chest pain nausea vomiting or abdominal pain.  She does admit to constipation and very poor appetite. She is willing to pursue SNF placement, but hopes it will be for a short term rehab stay.    Assessment & Plan:  Acute hypoxic and hypercarbic respiratory failure due to acute pulmonary edema Cont to diurese - O2 requirement increased from 4L to 6L this morning - attempt to wean back down as able -if oxygen stabilizes with 4 L or less of support patient will be a candidate to transfer to SNF 3/13  Acute on chronic grade 2 diastolic CHF - Mod/severe mitral regurgitation - Mod/severe TR - Severe pulmonary hypertension Appears to be better compensated at present - net negative 30cc presently -continue diuretic as ordered by Cardiology  Filed Weights   10/19/17 0500 10/20/17 0400 10/21/17 0329  Weight: 62.9 kg (138 lb 10.7 oz) 61.4 kg (135 lb 5.8 oz) 61.7 kg (136 lb 0.4 oz)    Upper airway cough syndrome - Asthmatic bronchitis  nasal irrigation, flonase - stable at this time   DM2 CBG controlled at this time   Hyperkalemia Resolved  Hypophosphatemia Resolved   Mild thrombocytopenia Resolving   Deconditioning had recent hip surgery - will need SNF placement - CSW is working to arrange   DVT prophylaxis: SQ heparin  Code Status: FULL CODE Family  Communication: spoke w/ son at bedside   Disposition Plan: tele bed - wean O2 as able - plan for d/c to SNF 3/13 if sats stable on 4LNC or less   Consultants:  Cardiology - [place]  Antimicrobials:  Vancomycin 3/08 > 3/08 Cefepime 3/08 > 3/08  Objective: Blood pressure 120/64, pulse 92, temperature 97.7 F (36.5 C), temperature source Oral, resp. rate 14, height 5\' 4"  (1.626 m), weight 61.7 kg (136 lb 0.4 oz), SpO2 97 %.  Intake/Output Summary (Last 24 hours) at 10/21/2017 1143 Last data filed at 10/21/2017 0331 Gross per 24 hour  Intake 560 ml  Output 1550 ml  Net -990 ml   Filed Weights   10/19/17 0500 10/20/17 0400 10/21/17 0329  Weight: 62.9 kg (138 lb 10.7 oz) 61.4 kg (135 lb 5.8 oz) 61.7 kg (136 lb 0.4 oz)    Examination: General: No acute respiratory distress Lungs: improved air movement th/o - no wheezing  Cardiovascular: RRR - occasional ectopic beats - systolic M 3/6 w/o change  Abdomen: NT/ND, soft, bs+, no mass  Extremities: No significant edema bilateral lower extremities  CBC: Recent Labs  Lab 10/16/17 2150 10/17/17 0611 10/18/17 0246 10/19/17 0336 10/20/17 0420 10/21/17 0357  WBC 18.7* 14.7* 24.2* 10.8* 9.2 8.2  NEUTROABS 11.9*  --   --   --   --   --   HGB 14.6 12.2 12.6 10.2* 10.6* 10.4*  HCT 46.3* 38.5 39.8 31.6* 34.4* 33.2*  MCV 92.0 91.4 92.6 92.9 91.7 91.5  PLT 248 165 239 139* 182 573   Basic Metabolic Panel: Recent Labs  Lab 10/17/17 0611 10/17/17 2145 10/18/17 0246 10/18/17 1439 10/19/17 0336 10/20/17 0420 10/21/17 0357  NA 136 131* 132*  --  132* 133* 135  K 3.3* 5.5* 5.8* 5.5* 5.0 4.8 4.2  CL 101 100* 100*  --  96* 94* 91*  CO2 21* 24 23  --  27 30 34*  GLUCOSE 239* 179* 122*  --  103* 119* 107*  BUN 16 16 18   --  30* 28* 24*  CREATININE 0.97 0.75 0.80  --  1.09* 0.86 0.83  CALCIUM 8.0* 8.3* 8.5*  --  8.6* 8.8* 9.0  MG 1.6* 2.1 2.0  --   --  1.8  --   PHOS 2.6 5.1* 4.1  --   --   --  4.5   GFR: Estimated Creatinine  Clearance: 35.8 mL/min (by C-G formula based on SCr of 0.83 mg/dL).  Liver Function Tests: Recent Labs  Lab 10/16/17 2150 10/21/17 0357  AST 45* 24  ALT 26 46  ALKPHOS 89 75  BILITOT 0.7 0.9  PROT 7.4 5.5*  ALBUMIN 4.0 2.7*   HbA1C: Hemoglobin A1C  Date/Time Value Ref Range Status  02/23/2016 5.3  Final   Hgb A1c MFr Bld  Date/Time Value Ref Range Status  07/01/2017 02:57 PM 5.7 (H) 4.8 - 5.6 % Final    Comment:    (NOTE) Pre diabetes:          5.7%-6.4% Diabetes:              >6.4% Glycemic control for   <7.0% adults with diabetes     CBG: Recent Labs  Lab 10/20/17 0716 10/20/17 1148 10/20/17 1521 10/20/17 2206 10/21/17 0752  GLUCAP 114* 127* 140* 168* 102*    Recent Results (from the past 240 hour(s))  Blood Culture (routine x 2)     Status: None (Preliminary result)   Collection Time: 10/16/17 10:22 PM  Result Value Ref Range Status   Specimen Description BLOOD RIGHT HAND  Final   Special Requests IN PEDIATRIC BOTTLE Blood Culture adequate volume  Final   Culture   Final    NO GROWTH 3 DAYS Performed at [facility], [place] [address], [place], [place] [address]    Report Status PENDING  Incomplete  Blood Culture (routine x 2)     Status: None (Preliminary result)   Collection Time: 10/16/17 10:25 PM  Result Value Ref Range Status   Specimen Description BLOOD LEFT ANTECUBITAL  Final   Special Requests AEROBIC BOTTLE ONLY Blood Culture adequate volume  Final   Culture   Final    NO GROWTH 3 DAYS Performed at [facility], [place] [address], [place], [place] [address]    Report Status PENDING  Incomplete  Respiratory Panel by PCR     Status: None   Collection Time: 10/17/17  1:11 AM  Result Value Ref Range Status   Adenovirus NOT DETECTED NOT DETECTED Final   Coronavirus 229E NOT DETECTED NOT DETECTED Final   Coronavirus HKU1 NOT DETECTED NOT DETECTED Final   Coronavirus NL63 NOT DETECTED NOT DETECTED Final   Coronavirus OC43 NOT DETECTED  NOT DETECTED Final   Metapneumovirus NOT DETECTED NOT DETECTED Final   Rhinovirus / Enterovirus NOT DETECTED NOT DETECTED Final   Influenza A NOT DETECTED NOT DETECTED Final   Influenza B NOT DETECTED NOT DETECTED Final  Parainfluenza Virus 1 NOT DETECTED NOT DETECTED Final   Parainfluenza Virus 2 NOT DETECTED NOT DETECTED Final   Parainfluenza Virus 3 NOT DETECTED NOT DETECTED Final   Parainfluenza Virus 4 NOT DETECTED NOT DETECTED Final   Respiratory Syncytial Virus NOT DETECTED NOT DETECTED Final   Bordetella pertussis NOT DETECTED NOT DETECTED Final   Chlamydophila pneumoniae NOT DETECTED NOT DETECTED Final   Mycoplasma pneumoniae NOT DETECTED NOT DETECTED Final    Comment: Performed at [facility], [place] [address], [place], [place] [address]  MRSA PCR Screening     Status: None   Collection Time: 10/17/17  3:14 AM  Result Value Ref Range Status   MRSA by PCR NEGATIVE NEGATIVE Final    Comment:        The GeneXpert MRSA Assay (FDA approved for NASAL specimens only), is one component of a comprehensive MRSA colonization surveillance program. It is not intended to diagnose MRSA infection nor to guide or monitor treatment for MRSA infections. Performed at [facility], [place] [address], [place], [place] [address]      Scheduled Meds: . arformoterol  15 mcg Nebulization BID  . aspirin EC  81 mg Oral Once per day on Mon Thu  . bisoprolol  2.5 mg Oral Daily  . budesonide (PULMICORT) nebulizer solution  0.5 mg Nebulization BID  . clopidogrel  75 mg Oral Daily  . feeding supplement (ENSURE ENLIVE)  237 mL Oral BID BM  . fluticasone  1 spray Each Nare Daily  . furosemide  40 mg Oral BID  . heparin  5,000 Units Subcutaneous Q8H  . insulin aspart  0-5 Units Subcutaneous QHS  . insulin aspart  0-9 Units Subcutaneous TID WC  . isosorbide-hydrALAZINE  1 tablet Oral BID  . mouth rinse  15 mL Mouth Rinse BID  . pantoprazole  40 mg Oral BID AC  . sodium chloride  2 spray  Each Nare BID  . tobramycin-dexamethasone   Both Eyes QHS  . tobramycin-dexamethasone  1 drop Both Eyes Q6H     LOS: 4 days   Cherene Altes, MD [facility] Office  [phone] Pager - Text Page per Amion as per below:  On-Call/Text Page:      [email or web address]      password [email or web address]  If 7PM-7AM, please contact night-coverage [email or web address] Password [facility] 10/21/2017, 11:43 AM

## 2017-10-22 LAB — BASIC METABOLIC PANEL
Anion gap: 12 (ref 5–15)
BUN: 20 mg/dL (ref 6–20)
CO2: 37 mmol/L — ABNORMAL HIGH (ref 22–32)
CREATININE: 0.84 mg/dL (ref 0.44–1.00)
Calcium: 9 mg/dL (ref 8.9–10.3)
Chloride: 86 mmol/L — ABNORMAL LOW (ref 101–111)
GFR, EST NON AFRICAN AMERICAN: 58 mL/min — AB (ref >60)
Glucose, Bld: 103 mg/dL — ABNORMAL HIGH (ref 65–99)
Potassium: 3.6 mmol/L (ref 3.5–5.1)
SODIUM: 135 mmol/L (ref 135–145)

## 2017-10-22 LAB — CULTURE, BLOOD (ROUTINE X 2)
Culture: NO GROWTH
Culture: NO GROWTH
Special Requests: ADEQUATE
Special Requests: ADEQUATE

## 2017-10-22 LAB — GLUCOSE, CAPILLARY
GLUCOSE-CAPILLARY: 111 mg/dL — AB (ref 65–99)
GLUCOSE-CAPILLARY: 111 mg/dL — AB (ref 65–99)
Glucose-Capillary: 192 mg/dL — ABNORMAL HIGH (ref 65–99)

## 2017-10-22 MED ORDER — BISOPROLOL FUMARATE 5 MG PO TABS: 2.5000 mg | ORAL_TABLET | Freq: Every day | ORAL | 0 refills | Status: AC

## 2017-10-22 MED ORDER — FUROSEMIDE 40 MG PO TABS: 40.0000 mg | ORAL_TABLET | Freq: Two times a day (BID) | ORAL | 0 refills | Status: AC

## 2017-10-22 MED ORDER — ISOSORB DINITRATE-HYDRALAZINE 20-37.5 MG PO TABS: 1.0000 | ORAL_TABLET | Freq: Two times a day (BID) | ORAL | 0 refills | Status: DC

## 2017-10-22 NOTE — Clinical Social Work Placement (Signed)
   CLINICAL SOCIAL WORK PLACEMENT  NOTE  Date:  10/22/2017  Patient Details  Name: [name] MRN: [id number] Date of Birth: June 03, 1921  Clinical Social Work is seeking post-discharge placement for this patient at the [place] level of care (*CSW will initial, date and re-position this form in  chart as items are completed):  Yes   Patient/family provided with [place] Work Department's list of facilities offering this level of care within the geographic area requested by the patient (or if unable, by the patient's family).  Yes   Patient/family informed of their freedom to choose among providers that offer the needed level of care, that participate in Medicare, Medicaid or managed care program needed by the patient, have an available bed and are willing to accept the patient.  Yes   Patient/family informed of [facility]'s ownership interest in [facility] and [facility], as well as of the fact that they are under no obligation to receive care at these facilities.  PASRR submitted to EDS on       PASRR number received on       Existing PASRR number confirmed on 10/21/17     FL2 transmitted to all facilities in geographic area requested by pt/family on 10/21/17     FL2 transmitted to all facilities within larger geographic area on       Patient informed that his/her managed care company has contracts with or will negotiate with certain facilities, including the following:  [facility] and Rehab     Yes   Patient/family informed of bed offers received.  Patient chooses bed at [facility]     Physician recommends and patient chooses bed at      Patient to be transferred to [facility] on 10/22/17.  Patient to be transferred to facility by [facility]     Patient family notified on 10/22/17 of transfer.  Name of family member notified:  Roseanne Ren[place], son     PHYSICIAN Please prepare priority discharge  summary, including medications, Please prepare prescriptions     Additional Comment:    _______________________________________________ Estanislado Emms, LCSW 10/22/2017, 10:04 AM

## 2017-10-22 NOTE — Progress Notes (Signed)
Physical Therapy Treatment Patient Details Name: [name] MRN: [id number] DOB: 08-Mar-1921 Today's Date: 10/22/2017    History of Present Illness 84 yo female w/ a hx of asthma, colitis, colon cancer, DM, DVT, HTN, GERD, Hiatal hernia, Nephrolithiasis, HTN, and HLD who presented to the ER with nausea, dry heaves, and dyspnea.  She was hypoxic in ER and required intubation.  She was found to have CHF with acute pulmonary edema and new valvular heart disease.      PT Comments    Progressing steadily.  Still unable to manage with supplemental O2, but with 2 L [place] ambulated ~120 feet and maintained sats at 94%   Follow Up Recommendations  SNF;Supervision/Assistance - 24 hour     Equipment Recommendations  None recommended by PT    Recommendations for Other Services       Precautions / Restrictions Precautions Precautions: Fall    Mobility  Bed Mobility Overal bed mobility: Needs Assistance Bed Mobility: Supine to Sit     Supine to sit: Min guard     General bed mobility comments: no rail and no assist.  strong scoot to EOB using UEs  Transfers Overall transfer level: Needs assistance Equipment used: Rolling walker (2 wheeled) Transfers: Sit to/from Stand Sit to Stand: Min guard         General transfer comment: cues for hand placement and guard foe safety, pt attained standing without assist, but needs to practice coming forward more.  Ambulation/Gait Ambulation/Gait assistance: Min guard;+2 safety/equipment(+2 for O2 and chair.) Ambulation Distance (Feet): 120 Feet Assistive device: Rolling walker (2 wheeled) Gait Pattern/deviations: Step-through pattern   Gait velocity interpretation: at or above normal speed for age/gender General Gait Details: steady in the RW, sats on 2L maintained at 94% in the 80's and 90's bpm   Stairs            Wheelchair Mobility    Modified Rankin (Stroke Patients Only)       Balance Overall balance assessment: Needs  assistance   Sitting balance-Leahy Scale: Good       Standing balance-Leahy Scale: Poor                              Cognition Arousal/Alertness: Awake/alert Behavior During Therapy: WFL for tasks assessed/performed Overall Cognitive Status: Within Functional Limits for tasks assessed                                        Exercises      General Comments        Pertinent Vitals/Pain      Home Living                      Prior Function            PT Goals (current goals can now be found in the care plan section) Acute Rehab PT Goals Patient Stated Goal: To go to rehab prior to back home PT Goal Formulation: With patient/family Time For Goal Achievement: 11/03/17 Potential to Achieve Goals: Good Progress towards PT goals: Progressing toward goals    Frequency    Min 3X/week      PT Plan Current plan remains appropriate    Co-evaluation              AM-PAC PT "6 Clicks" Daily Activity  Outcome Measure  Difficulty turning over in bed (including adjusting bedclothes, sheets and blankets)?: A Little Difficulty moving from lying on back to sitting on the side of the bed? : A Lot Difficulty sitting down on and standing up from a chair with arms (e.g., wheelchair, bedside commode, etc,.)?: A Little Help needed moving to and from a bed to chair (including a wheelchair)?: A Little Help needed walking in hospital room?: A Little Help needed climbing 3-5 steps with a railing? : A Lot 6 Click Score: 16    End of Session Equipment Utilized During Treatment: Gait belt;Oxygen Activity Tolerance: Patient tolerated treatment well Patient left: in chair;with call bell/phone within reach;with family/visitor present Nurse Communication: Mobility status PT Visit Diagnosis: Other abnormalities of gait and mobility (R26.89);Muscle weakness (generalized) (M62.81)     Time: 8341-9622 PT Time Calculation (min) (ACUTE ONLY): 22  min  Charges:  $Gait Training: 8-22 mins                    G Codes:       11/05/17  [name], PT [phone]  (pager)   [name] November 05, 2017, 4:57 PM

## 2017-10-22 NOTE — Discharge Summary (Signed)
Physician Discharge Summary  [name] GQQ:[id number] DOB: 1921/01/05 DOA: 10/16/2017  PCP: [name], NP  Admit date: 10/16/2017 Discharge date: 10/22/2017  Time spent: > 35 minutes  Recommendations for Outpatient Follow-up:  1. Please ensure patient follows up with cardiology   Discharge Diagnoses:  Active Problems:   Acute respiratory failure ([place])   Discharge Condition: stable  Diet recommendation: Carb modified diet.  Filed Weights   10/20/17 0400 10/21/17 0329 10/22/17 0520  Weight: 61.4 kg (135 lb 5.8 oz) 61.7 kg (136 lb 0.4 oz) 57.7 kg (127 lb 4.8 oz)    History of present illness:  Assessment: 84 year old Caucasian female  Acute on chronic diastolic heart failure secondary to valvular heart disease: Now resolving Severe mitral regurgitation: Suspect flail posterior leaflet Severe pulonary hypertension HypertensionH/o asthma H/o TIA  Hospital Course:  As per cardiology recommendations  Recommendations: Euvolumic. Continue bisoprolol and bidil. Continue Lasix 40 mg PO bid. Okay to discharge. Will setup outpatient follow up in the office. Patient's HCPOA daughter is out of country and returns 03/20. Will arrange the follow up in the office at patient's convenience and then discuss TEE and surgical referral for what appears to be flail posterior mitral leaflet.   Procedures:  None  Consultations:  Cardiology  Discharge Exam: Vitals:   10/22/17 0943 10/22/17 1202  BP:  (!) 112/57  Pulse:    Resp:    Temp:  98.2 F (36.8 C)  SpO2: 97%     General: Pt in nad, alert and awake Cardiovascular: rrr, no rubs Respiratory: no increased wob, no wheezes  Discharge Instructions   Discharge Instructions    Call MD for:  difficulty breathing, headache or visual disturbances   Complete by:  As directed    Call MD for:  persistant nausea and vomiting   Complete by:  As directed    Diet - low sodium heart healthy   Complete by:  As directed     Discharge instructions   Complete by:  As directed    Ensure follow up with cardiologist for further evaluation and recommendations.   Increase activity slowly   Complete by:  As directed      Allergies as of 10/22/2017      Reactions   Albuterol Anxiety   Tachycardia, Tachypnea   Prochlorperazine Other (See Comments)   tremor   Celecoxib Nausea And Vomiting, Other (See Comments)   Body aches   Codeine Nausea And Vomiting   Tolerates hydrocodone   Fish Oil Nausea And Vomiting   Fosamax [alendronate Sodium] Nausea And Vomiting   Morphine And Related Nausea And Vomiting   Moxifloxacin Other (See Comments)   fatigue   Oxycodone Nausea And Vomiting   Tolerates hydrocodone   Pregabalin Swelling   Propoxyphene Nausea And Vomiting   Statins Other (See Comments)   Muscle pain   Aspirin-dipyridamole Er Other (See Comments)   Headcache   Buprenorphine Hcl Nausea And Vomiting      Medication List    STOP taking these medications   HYDROcodone-acetaminophen 5-325 MG tablet Commonly known as:  NORCO/VICODIN   polyethylene glycol powder powder Commonly known as:  GLYCOLAX/MIRALAX   primidone 50 MG tablet Commonly known as:  MYSOLINE     TAKE these medications   acetaminophen 500 MG tablet Commonly known as:  TYLENOL Take 500 mg by mouth daily.   albuterol 108 (90 Base) MCG/ACT inhaler Commonly known as:  PROVENTIL HFA;VENTOLIN HFA Inhale 1 puff into the lungs every 4 (four) hours  as needed for wheezing or shortness of breath.   aspirin EC 81 MG tablet Take 81 mg by mouth 2 (two) times a week. on Sunday and Thursday only.   bisoprolol 5 MG tablet Commonly known as:  ZEBETA Take 0.5 tablets (2.5 mg total) by mouth daily. Start taking on:  10/23/2017 What changed:    medication strength  how much to take   CALCIUM 600+D PLUS MINERALS 600-400 MG-UNIT Chew Chew 2 tablets every morning by mouth.   clopidogrel 75 MG tablet Commonly known as:  PLAVIX Take 1 tablet (75  mg total) by mouth daily.   dexlansoprazole 60 MG capsule Commonly known as:  DEXILANT TAKE 1 CAPSULE BY MOUTH EVERY MORNING BEFORE BREAKFAST.   dimenhyDRINATE 50 MG tablet Commonly known as:  DRAMAMINE Take 50 mg every 8 (eight) hours as needed by mouth (for dizziness/vertigo).   ferrous sulfate 325 (65 FE) MG tablet Take 325 mg every Monday, Wednesday, and Friday by mouth. In the morning with breakfast.   fluticasone 50 MCG/ACT nasal spray Commonly known as:  FLONASE Place 1 spray 2 (two) times daily as needed into both nostrils (for allergies.).   fluticasone furoate-vilanterol 100-25 MCG/INH Aepb Commonly known as:  BREO ELLIPTA Inhale 1 puff into the lungs daily.   furosemide 40 MG tablet Commonly known as:  LASIX Take 1 tablet (40 mg total) by mouth 2 (two) times daily.   gabapentin 100 MG capsule Commonly known as:  NEURONTIN Take 1 capsule (100 mg total) by mouth daily.   ipratropium 0.03 % nasal spray Commonly known as:  ATROVENT Place 1 spray into both nostrils at bedtime.   isosorbide-hydrALAZINE 20-37.5 MG tablet Commonly known as:  BIDIL Take 1 tablet by mouth 2 (two) times daily.   loratadine 10 MG tablet Commonly known as:  CLARITIN Take 10 mg by mouth daily.   LUBRICANT EYE DROPS 0.4-0.3 % Soln Generic drug:  Polyethyl Glycol-Propyl Glycol Apply 1 drop to eye 4 (four) times daily.   METAMUCIL FIBER PO Take 1 scoop by mouth daily.   NASAL SPRAY SALINE NA Place 1 spray 4 (four) times daily as needed into both nostrils (congestion).   ondansetron 4 MG tablet Commonly known as:  ZOFRAN Take 2 mg 2 (two) times daily by mouth. In the morning & with hydrocodone at bedtime.   PROBIOTIC PO Take 1 capsule daily with lunch by mouth.   TOBRADEX ophthalmic ointment Generic drug:  tobramycin-dexamethasone APPLY IN BOTH EYES AT BEDTIME   tobramycin-dexamethasone ophthalmic solution Commonly known as:  TOBRADEX INSTILL 1 DROP INTO BOTH EYES 4 TIMES A  DAY   Vitamin D3 10000 units Tabs Take 10,000 Units 4 (four) times a week by mouth. Monday, Tuesday, Wednesday, & Thursday      Allergies  Allergen Reactions  . Albuterol Anxiety    Tachycardia, Tachypnea  . Prochlorperazine Other (See Comments)    tremor  . Celecoxib Nausea And Vomiting and Other (See Comments)    Body aches  . Codeine Nausea And Vomiting    Tolerates hydrocodone  . Fish Oil Nausea And Vomiting  . Fosamax [Alendronate Sodium] Nausea And Vomiting  . Morphine And Related Nausea And Vomiting  . Moxifloxacin Other (See Comments)    fatigue  . Oxycodone Nausea And Vomiting    Tolerates hydrocodone  . Pregabalin Swelling  . Propoxyphene Nausea And Vomiting  . Statins Other (See Comments)    Muscle pain  . Aspirin-Dipyridamole Er Other (See Comments)    Headcache  .  Buprenorphine Hcl Nausea And Vomiting   Contact information for after-discharge care    Destination    [facility] .   Service:  Skilled Nursing Contact information: [address] [place] [phone]               The results of significant diagnostics from this hospitalization (including imaging, microbiology, ancillary and laboratory) are listed below for reference.    Significant Diagnostic Studies: Dg Chest Port 1 View  Result Date: 10/19/2017 CLINICAL DATA:  CHF EXAM: PORTABLE CHEST 1 VIEW COMPARISON:  10/18/2017 FINDINGS: Cardiomegaly. Moderate bilateral pleural effusions. Diffuse bilateral airspace disease again noted, not significantly changed. IMPRESSION: Continued diffuse bilateral airspace disease which could reflect edema or infection. Stable moderate effusions. Electronically Signed   By: Rolm Baptise M.D.   On: 10/19/2017 07:16   Dg Chest Port 1 View  Result Date: 10/18/2017 CLINICAL DATA:  Respiratory failure. EXAM: PORTABLE CHEST 1 VIEW COMPARISON:  10/16/2017 FINDINGS: Endotracheal and enteric tubes have been removed.  The cardiac silhouette remains mildly enlarged. Aortic atherosclerosis is noted. Veiling opacities in the right greater than left lung bases obscure the hemidiaphragms and have increased from prior. There is pulmonary vascular congestion with diffuse interstitial and patchy airspace opacities in both lungs, similar to the prior study. No pneumothorax is identified. IMPRESSION: 1. Interval extubation. 2. Increasing bilateral pleural effusions. 3. Similar diffuse interstitial and patchy airspace opacities in both lungs which may reflect edema or pneumonia. Electronically Signed   By: Logan Bores M.D.   On: 10/18/2017 07:48   Dg Chest Port 1 View  Result Date: 10/16/2017 CLINICAL DATA:  Initial evaluation for intubation. EXAM: PORTABLE CHEST 1 VIEW COMPARISON:  Prior radiograph from 01/31/2017. FINDINGS: Endotracheal tube in place with tip well positioned 2.7 cm above the carina. Tip of an apparent additional enteric tube positioned above the thoracic inlet, which may be in the esophagus or airway. Stable cardiomegaly. Mediastinal silhouette normal. Aortic atherosclerosis. Failing opacities overlying the bilateral hemidiaphragms, consistent with effusions. Diffuse vascular congestion with interstitial prominence, consistent with moderate diffuse pulmonary edema. Superimposed bibasilar opacities favored to reflect atelectasis and/or edema. Superimposed infiltrates would be difficult to exclude. No pneumothorax. No acute osseous abnormality. IMPRESSION: 1. Endotracheal tube in place with tip well positioned 2.7 cm above the carina. 2. Tip of enteric tube positioned above the thoracic inlet. This may lie within the trachea or esophagus. Repositioning recommended. 3. Cardiomegaly with moderate diffuse pulmonary edema and bilateral pleural effusions, suggesting CHF. Superimposed bibasilar opacities favored to reflect atelectasis/edema. Infiltrates could be considered in the correct clinical setting. Electronically  Signed   By: Jeannine Boga M.D.   On: 10/16/2017 22:47   Dg Abd Portable 1 View  Result Date: 10/17/2017 CLINICAL DATA:  OG tube placement. EXAM: PORTABLE ABDOMEN - 1 VIEW COMPARISON:  None. FINDINGS: Tip and side-port of the enteric tube below the diaphragm in the stomach. No bowel obstruction in the visualized upper abdomen. Bibasilar opacities and perihilar edema, as seen on recent chest radiograph. IMPRESSION: Tip and side port of the enteric tube in the stomach Electronically Signed   By: Jeb Levering M.D.   On: 10/17/2017 01:35    Microbiology: Recent Results (from the past 240 hour(s))  Blood Culture (routine x 2)     Status: None   Collection Time: 10/16/17 10:22 PM  Result Value Ref Range Status   Specimen Description BLOOD RIGHT HAND  Final   Special Requests IN PEDIATRIC BOTTLE  Blood Culture adequate volume  Final   Culture   Final    NO GROWTH 5 DAYS Performed at [facility], [place] [address], [place], [place] [address]    Report Status 10/22/2017 FINAL  Final  Blood Culture (routine x 2)     Status: None   Collection Time: 10/16/17 10:25 PM  Result Value Ref Range Status   Specimen Description BLOOD LEFT ANTECUBITAL  Final   Special Requests AEROBIC BOTTLE ONLY Blood Culture adequate volume  Final   Culture   Final    NO GROWTH 5 DAYS Performed at [facility], [place] [address], [place], [place] [address]    Report Status 10/22/2017 FINAL  Final  Respiratory Panel by PCR     Status: None   Collection Time: 10/17/17  1:11 AM  Result Value Ref Range Status   Adenovirus NOT DETECTED NOT DETECTED Final   Coronavirus 229E NOT DETECTED NOT DETECTED Final   Coronavirus HKU1 NOT DETECTED NOT DETECTED Final   Coronavirus NL63 NOT DETECTED NOT DETECTED Final   Coronavirus OC43 NOT DETECTED NOT DETECTED Final   Metapneumovirus NOT DETECTED NOT DETECTED Final   Rhinovirus / Enterovirus NOT DETECTED NOT DETECTED Final   Influenza A NOT DETECTED NOT DETECTED  Final   Influenza B NOT DETECTED NOT DETECTED Final   Parainfluenza Virus 1 NOT DETECTED NOT DETECTED Final   Parainfluenza Virus 2 NOT DETECTED NOT DETECTED Final   Parainfluenza Virus 3 NOT DETECTED NOT DETECTED Final   Parainfluenza Virus 4 NOT DETECTED NOT DETECTED Final   Respiratory Syncytial Virus NOT DETECTED NOT DETECTED Final   Bordetella pertussis NOT DETECTED NOT DETECTED Final   Chlamydophila pneumoniae NOT DETECTED NOT DETECTED Final   Mycoplasma pneumoniae NOT DETECTED NOT DETECTED Final    Comment: Performed at [facility], [place] [address] [place], [place] [address]  MRSA PCR Screening     Status: None   Collection Time: 10/17/17  3:14 AM  Result Value Ref Range Status   MRSA by PCR NEGATIVE NEGATIVE Final    Comment:        The GeneXpert MRSA Assay (FDA approved for NASAL specimens only), is one component of a comprehensive MRSA colonization surveillance program. It is not intended to diagnose MRSA infection nor to guide or monitor treatment for MRSA infections. Performed at [facility], [place] [address] [place], [place] [address]      Labs: Basic Metabolic Panel: Recent Labs  Lab 10/17/17 [phone] 10/17/17 2145 10/18/17 0246 10/18/17 1439 10/19/17 0336 10/20/17 0420 10/21/17 0357 10/22/17 0355  NA 136 131* 132*  --  132* 133* 135 135  K 3.3* 5.5* 5.8* 5.5* 5.0 4.8 4.2 3.6  CL 101 100* 100*  --  96* 94* 91* 86*  CO2 21* 24 23  --  27 30 34* 37*  GLUCOSE 239* 179* 122*  --  103* 119* 107* 103*  BUN 16 16 18   --  30* 28* 24* 20  CREATININE 0.97 0.75 0.80  --  1.09* 0.86 0.83 0.84  CALCIUM 8.0* 8.3* 8.5*  --  8.6* 8.8* 9.0 9.0  MG 1.6* 2.1 2.0  --   --  1.8  --   --   PHOS 2.6 5.1* 4.1  --   --   --  4.5  --    Liver Function Tests: Recent Labs  Lab 10/16/17 2150 10/21/17 0357  AST 45* 24  ALT 26 46  ALKPHOS 89 75  BILITOT 0.7 0.9  PROT 7.4 5.5*  ALBUMIN 4.0 2.7*   No results for input(s): LIPASE, AMYLASE in the last 168  hours. No results for input(s): AMMONIA in the last 168 hours. CBC: Recent Labs  Lab 10/16/17 2150 10/17/17 0611 10/18/17 0246 10/19/17 0336 10/20/17 0420 10/21/17 0357  WBC 18.7* 14.7* 24.2* 10.8* 9.2 8.2  NEUTROABS 11.9*  --   --   --   --   --   HGB 14.6 12.2 12.6 10.2* 10.6* 10.4*  HCT 46.3* 38.5 39.8 31.6* 34.4* 33.2*  MCV 92.0 91.4 92.6 92.9 91.7 91.5  PLT 248 165 239 139* 182 203   Cardiac Enzymes: No results for input(s): CKTOTAL, CKMB, CKMBINDEX, TROPONINI in the last 168 hours. BNP: BNP (last 3 results) Recent Labs    10/17/17 0036 10/17/17 0611 10/18/17 0749  BNP 665.0* 845.9* 1,148.3*    ProBNP (last 3 results) No results for input(s): PROBNP in the last 8760 hours.  CBG: Recent Labs  Lab 10/21/17 1121 10/21/17 1626 10/21/17 2110 10/22/17 0800 10/22/17 1204  GLUCAP 116* 109* 131* 111* 111*    Signed:  Velvet Bathe MD.  [facility] 10/22/2017, 3:38 PM

## 2017-10-22 NOTE — Care Management Important Message (Signed)
Important Message  Patient Details  Name: [name] MRN: [id number] Date of Birth: 05/13/1921   Medicare Important Message Given:  Yes    [name] [name] 10/22/2017, 2:14 PM

## 2017-10-22 NOTE — Progress Notes (Signed)
[facility] received patient's discharge information and prescriptions . RN called report to [employer]. Patient IV was removed.

## 2017-10-22 NOTE — Care Management Note (Signed)
Case Management Note  Patient Details  Name: [name] MRN: [id number] Date of Birth: 06-07-1921  Subjective/Objective: Pt presented for Acute Respiratory Failure and Nausea Intubated/ Extubated. Pt was a transfer from [facility]. 02 requirements monitored overnight 3-12/3-13. Plan will be for SNF once stable. CSW following for disposition needs.                   Action/Plan: No further needs from CM at this time.   Expected Discharge Date:                  Expected Discharge Plan:  Skilled Nursing Facility  In-House Referral:  Clinical Social Work  Discharge planning Services  CM Consult  Post Acute Care Choice:  NA Choice offered to:  NA  DME Arranged:  N/A DME Agency:  NA  HH Arranged:  NA HH Agency:  NA  Status of Service:  Completed, signed off  If discussed at [place] of Stay Meetings, dates discussed:    Additional Comments:  Bethena Roys, RN 10/22/2017, 12:33 PM

## 2017-10-22 NOTE — Clinical Social Work Note (Signed)
CSW facilitated patient discharge including contacting patient family and facility to confirm patient discharge plans. Clinical information faxed to facility and family agreeable with plan. CSW arranged ambulance transport via [facility] to [facility]. RN to call report prior to discharge ([phone]).  CSW will sign off for now as social work intervention is no longer needed. Please consult us again if new needs arise.  Juanita Devincent, CSW [phone]  

## 2017-10-22 NOTE — Progress Notes (Signed)
Subjective:  Feels well Breathing improved  Objective:  Vital Signs in the last 24 hours: Temp:  [97.4 F (36.3 C)-98.1 F (36.7 C)] 97.5 F (36.4 C) (03/13 0804) Pulse Rate:  [89-98] 98 (03/13 0520) Resp:  [17-20] 17 (03/13 0520) BP: (101-130)/(50-89) 130/89 (03/13 0804) SpO2:  [92 %-99 %] 97 % (03/13 0943) Weight:  [57.7 kg (127 lb 4.8 oz)] 57.7 kg (127 lb 4.8 oz) (03/13 0520)  Intake/Output from previous day: 03/12 0701 - 03/13 0700 In: 360 [P.O.:360] Out: 1500 [Urine:1500] Intake/Output from this shift: Total I/O In: 360 [P.O.:360] Out: -   Net -1.1 L  Physical Exam:  General appearance: alert, cooperative, appears stated age and no distress Eyes: negative findings: lids and lashes normal Neck: no adenopathy, no carotid bruit, no JVD, supple, symmetrical, trachea midline and thyroid not enlarged, symmetric, no tenderness/mass/nodules Neck: JVP - normal, carotids 2+ without bruits Resp: Faint bibasilar rales.  Chest Fitzgibbons: no tenderness Cardio: S1 Muffled with holosystolic murmur at the apex with radiation to the axilla.  S2 is normal.  No gallop appreciated. GI: soft, non-tender; bowel sounds normal; no masses,  no organomegaly Extremities: extremities normal, atraumatic, no cyanosis or edema     Lab Results: Recent Labs    10/20/17 0420 10/21/17 0357  WBC 9.2 8.2  HGB 10.6* 10.4*  PLT 182 203   Recent Labs    10/21/17 0357 10/22/17 0355  NA 135 135  K 4.2 3.6  CL 91* 86*  CO2 34* 37*  GLUCOSE 107* 103*  BUN 24* 20  CREATININE 0.83 0.84   Hepatic Function Panel Recent Labs    10/21/17 0357  PROT 5.5*  ALBUMIN 2.7*  AST 24  ALT 46  ALKPHOS 75  BILITOT 0.9    Cardiac Studies: Echocardiogram 10/17/2017:Normal LV size, mild LVH, normal LVEF at 60-65%, no Amyx motion abnormality. Grade 2 diastolic dysfunction. Mildly thickened mitral valve leaflet. Mild mitral annular calcification. Moderate to severe MR. Moderate left atrial  enlargement. RV is mildly dilated with normal function. Moderate to severe TR with severe pulmonary hypertension, PASP 103 mmHg.  EKG 10/16/2017: Sinus tachycardia at the rate of 103 bpm. Biatrial enlargement. Nonspecific ST abnormality. Compared to 01/30/2017, sinus tachycardia new, nonspecific ST abnormalities new.   Assessment: 84 year old Caucasian female  Acute on chronic diastolic heart failure secondary to valvular heart disease: Now resolving Severe mitral regurgitation: Suspect flail posterior leaflet Severe pulonary hypertension HypertensionH/o asthma H/o TIA  Recommendations: Euvolumic. Continue bisoprolol and bidil. Continue Lasix 40 mg PO bid. Okay to discharge. Will setup outpatient follow up in the office. Patient's HCPOA daughter is out of country and returns 03/20. Will arrange the follow up in the office at patient's convenience and then discuss TEE and surgical referral for what appears to be flail posterior mitral leaflet.     LOS: 5 days    Jemar Paulsen J Nitya Cauthon 10/22/2017, 10:48 AM  Bazile Mills, MD [facility] Pager: [phone] Office: [phone] If no answer Cell [phone]

## 2017-10-22 NOTE — Clinical Social Work Note (Signed)
SNF needs discharge summary by 4:00 pm. CSW paged MD to notify.  Dayton Scrape, [place]

## 2017-10-22 NOTE — Progress Notes (Signed)
CSW spoke to patient's son, Shanon Brow, via phone this morning. They have chosen for patient to go to [facility]. CSW confirmed bed with [facility]; they can admit patient today if medically stable. Son to complete paperwork at [facility] at 1 pm today. Paged MD. CSW to support with discharge.  Estanislado Emms, [place]

## 2017-10-24 ENCOUNTER — Encounter: Payer: Self-pay | Admitting: Internal Medicine

## 2017-10-24 ENCOUNTER — Non-Acute Institutional Stay (SKILLED_NURSING_FACILITY): Payer: Medicare Other | Admitting: Internal Medicine

## 2017-10-24 DIAGNOSIS — E1140 Type 2 diabetes mellitus with diabetic neuropathy, unspecified: Secondary | ICD-10-CM

## 2017-10-24 DIAGNOSIS — J069 Acute upper respiratory infection, unspecified: Secondary | ICD-10-CM

## 2017-10-24 DIAGNOSIS — K219 Gastro-esophageal reflux disease without esophagitis: Secondary | ICD-10-CM

## 2017-10-24 DIAGNOSIS — E119 Type 2 diabetes mellitus without complications: Secondary | ICD-10-CM | POA: Diagnosis not present

## 2017-10-24 DIAGNOSIS — I10 Essential (primary) hypertension: Secondary | ICD-10-CM

## 2017-10-24 DIAGNOSIS — I340 Nonrheumatic mitral (valve) insufficiency: Secondary | ICD-10-CM | POA: Diagnosis not present

## 2017-10-24 DIAGNOSIS — J9601 Acute respiratory failure with hypoxia: Secondary | ICD-10-CM

## 2017-10-24 DIAGNOSIS — I270 Primary pulmonary hypertension: Secondary | ICD-10-CM

## 2017-10-24 DIAGNOSIS — D508 Other iron deficiency anemias: Secondary | ICD-10-CM

## 2017-10-24 DIAGNOSIS — J9602 Acute respiratory failure with hypercapnia: Secondary | ICD-10-CM | POA: Diagnosis not present

## 2017-10-24 DIAGNOSIS — D696 Thrombocytopenia, unspecified: Secondary | ICD-10-CM

## 2017-10-24 DIAGNOSIS — I5033 Acute on chronic diastolic (congestive) heart failure: Secondary | ICD-10-CM

## 2017-10-24 DIAGNOSIS — Z794 Long term (current) use of insulin: Secondary | ICD-10-CM

## 2017-10-24 NOTE — Progress Notes (Signed)
: Provider:  Hennie Duos., MD Location:  [place] Room Number: 100-P Place of Service:  SNF (31)  PCP: [name], NP Patient Care Team: [name], NP as PCP - General (Family Medicine)  Extended Emergency Contact Information Primary Emergency Contact: Pope,Kathy Address: [address]          [place], [place] [address] Johnnette Litter of [place] Phone: [phone] Mobile Phone: [phone] Relation: Daughter Secondary Emergency Contact: Pope,David Mobile Phone: [phone] Relation: Son     Allergies: Albuterol; Prochlorperazine; Celecoxib; Codeine; Fish oil; Fosamax [alendronate sodium]; Morphine and related; Moxifloxacin; Oxycodone; Pregabalin; Propoxyphene; Statins; Aspirin-dipyridamole er; and Buprenorphine hcl  Chief Complaint  Patient presents with  . New Admit To SNF    New admission to [facility] SNF    HPI: Patient is 84 y.o. female with history of TIA, hypertension, nephrolithiasis, GERD, hypertension, DVT, diabetes mellitus 2, asthma, and anemia who according to patient's son was feeling well until this day of admission when she suddenly developed shortness of breath while at physical therapy and was noted to be hypoxic.no fever, chills, cough, wheezing or chest pain On arrival to the ER patient was profoundly hypoxic and reported intubation. Patient was admitted from 3/7-13where she was treated for acute on chronic diastolic heart failure secondary to valvular disease. Patient felt to have severe mitral regurg possible flail posterior leaflet.improved with O2, Lasix, and bisoprolol and BiDil. Patient is admitted to skilled nursing facility for OT/PT.while at skilled nursing facility patient will be followed for GERD treated with dexamethasone, neuropathy treated with Neurontin, and anemia treated with iron.  Past Medical History:  Diagnosis Date  . Anemia    iron deficiency  . Arthritis   . Asthma   . Cataract   .  Chronic respiratory failure with hypoxia ([place])   . Colitis   . Colon cancer ([place])    basal cell nose  . Diabetes mellitus without complication ([place])    NO meds ever [place] related to steroid use. Has more problems eith hypoglycemia  . Diverticulitis   . DVT (deep venous thrombosis) ([place])   . Essential hypertension   . Family history of adverse reaction to anesthesia    both daughters have problems getting bp up after anesthesia  . GERD (gastroesophageal reflux disease)   . Hearing impaired   . Hiatal hernia    neuropathy  . History of kidney stones   . Hyperlipidemia   . Hypertension   . Pneumonia   . PONV (postoperative nausea and vomiting)    BP drops really low cant breathe well vomiting  . Seasonal allergies   . TIA (transient ischemic attack)    x2 2 years ago    Past Surgical History:  Procedure Laterality Date  . APPENDECTOMY    . CHOLECYSTECTOMY    . COLON RESECTION  1967  . EYE SURGERY     cataract  . JOINT REPLACEMENT     Right hip Dr. Ninfa Linden 07/11/17  . KIDNEY SURGERY  1966   cyst  . TONSILLECTOMY    . TOTAL ABDOMINAL HYSTERECTOMY    . TOTAL HIP ARTHROPLASTY Right 07/11/2017   Procedure: RIGHT TOTAL HIP ARTHROPLASTY ANTERIOR APPROACH;  Surgeon: Mcarthur Rossetti, MD;  Location: [facility] ORS;  Service: Orthopedics;  Laterality: Right;    Allergies as of 10/24/2017      Reactions   Albuterol Anxiety   Tachycardia, Tachypnea   Prochlorperazine Other (See Comments)   tremor   Celecoxib  Nausea And Vomiting, Other (See Comments)   Body aches   Codeine Nausea And Vomiting   Tolerates hydrocodone   Fish Oil Nausea And Vomiting   Fosamax [alendronate Sodium] Nausea And Vomiting   Morphine And Related Nausea And Vomiting   Moxifloxacin Other (See Comments)   fatigue   Oxycodone Nausea And Vomiting   Tolerates hydrocodone   Pregabalin Swelling   Propoxyphene Nausea And Vomiting   Statins Other (See Comments)   Muscle pain   Aspirin-dipyridamole Er  Other (See Comments)   Headcache   Buprenorphine Hcl Nausea And Vomiting      Medication List        Accurate as of 10/24/17 10:47 AM. Always use your most recent med list.          acetaminophen 500 MG tablet Commonly known as:  TYLENOL Take 500 mg by mouth daily.   aspirin EC 81 MG tablet Take 81 mg by mouth 2 (two) times a week. on Sunday and Thursday only.   bisoprolol 5 MG tablet Commonly known as:  ZEBETA Take 0.5 tablets (2.5 mg total) by mouth daily.   CALCIUM 600+D PLUS MINERALS 600-400 MG-UNIT Chew Chew 2 tablets every morning by mouth.   clopidogrel 75 MG tablet Commonly known as:  PLAVIX Take 1 tablet (75 mg total) by mouth daily.   dexlansoprazole 60 MG capsule Commonly known as:  DEXILANT TAKE 1 CAPSULE BY MOUTH EVERY MORNING BEFORE BREAKFAST.   dimenhyDRINATE 50 MG tablet Commonly known as:  DRAMAMINE Take 50 mg every 8 (eight) hours as needed by mouth (for dizziness/vertigo).   ferrous sulfate 325 (65 FE) MG tablet Take 325 mg by mouth every Monday, Wednesday, and Friday. In the morning with breakfast.   fluticasone 50 MCG/ACT nasal spray Commonly known as:  FLONASE Place 1 spray 2 (two) times daily as needed into both nostrils (for allergies.).   fluticasone furoate-vilanterol 100-25 MCG/INH Aepb Commonly known as:  BREO ELLIPTA Inhale 1 puff into the lungs daily.   furosemide 40 MG tablet Commonly known as:  LASIX Take 1 tablet (40 mg total) by mouth 2 (two) times daily.   gabapentin 100 MG capsule Commonly known as:  NEURONTIN Take 1 capsule (100 mg total) by mouth daily.   ipratropium 0.03 % nasal spray Commonly known as:  ATROVENT Place 1 spray into both nostrils at bedtime.   isosorbide-hydrALAZINE 20-37.5 MG tablet Commonly known as:  BIDIL Take 1 tablet by mouth 2 (two) times daily.   loratadine 10 MG tablet Commonly known as:  CLARITIN Take 10 mg by mouth daily.   LUBRICANT EYE DROPS 0.4-0.3 % Soln Generic drug:  Polyethyl  Glycol-Propyl Glycol Apply 1 drop to eye 4 (four) times daily.   NASAL SPRAY SALINE NA Place 1 spray 4 (four) times daily as needed into both nostrils (congestion).   omeprazole 40 MG capsule Commonly known as:  PRILOSEC Take 40 mg by mouth daily.   ondansetron 4 MG tablet Commonly known as:  ZOFRAN Take 2-4 mg by mouth See admin instructions. In the morning and at bedtime.   polyethylene glycol packet Commonly known as:  MIRALAX / GLYCOLAX Take 17 g by mouth daily.   PROBIOTIC PO Take 1 capsule daily with lunch by mouth.   Vitamin D3 10000 units Tabs Take 10,000 Units 4 (four) times a week by mouth. Monday, Tuesday, Wednesday, & Thursday       No orders of the defined types were placed in this encounter.   Immunization  History  Administered Date(s) Administered  . Influenza Split 07/03/2011, 06/16/2012  . Influenza, High Dose Seasonal PF 03/28/2017  . Influenza, Seasonal, Injecte, Preservative Fre 08/12/2012  . Influenza-Unspecified 04/15/2014, 04/13/2015, 05/04/2016  . Pneumococcal Conjugate-13 10/17/2015  . Pneumococcal Polysaccharide-23 11/26/2016  . Zoster 08/12/2012    Social History   Tobacco Use  . Smoking status: Former Smoker    Types: Cigarettes  . Smokeless tobacco: Never Used  Substance Use Topics  . Alcohol use: No    Family history is   Family History  Problem Relation Age of Onset  . Breast cancer Mother   . Heart attack Mother   . Emphysema Sister        smoked  . Prostate cancer Brother   . Heart disease Brother       Review of Systems  DATA OBTAINED: from patient, nurse GENERAL:  no fevers, fatigue, appetite changes SKIN: No itching, or rash EYES: No eye pain, redness, discharge EARS: No earache, tinnitus, change in hearing NOSE: No congestion, drainage or bleeding  MOUTH/THROAT: No mouth or tooth pain, No sore throat RESPIRATORY: No cough, wheezing, SOB CARDIAC: No chest pain, palpitations, lower extremity edema  GI: No  abdominal pain, No N/V/D or constipation, No heartburn or reflux  GU: No dysuria, frequency or urgency, or incontinence  MUSCULOSKELETAL: No unrelieved bone/joint pain NEUROLOGIC: No headache, dizziness or focal weakness PSYCHIATRIC: No c/o anxiety or sadness   Vitals:   10/24/17 0944  BP: 108/62  Pulse: 81  Resp: 16  Temp: (!) 97.2 F (36.2 C)  SpO2: 96%    SpO2 Readings from Last 1 Encounters:  10/24/17 96%   Body mass index is 21.85 kg/m.     Physical Exam  GENERAL APPEARANCE: Alert, conversant,  No acute distress.  SKIN: No diaphoresis rash HEAD: Normocephalic, atraumatic  EYES: Conjunctiva/lids clear. Pupils round, reactive. EOMs intact.  EARS: External exam WNL, canals clear. Hearing grossly normal.  NOSE: No deformity or discharge.  MOUTH/THROAT: Lips w/o lesions  RESPIRATORY: Breathing is even, unlabored. Lung sounds are clear   CARDIOVASCULAR: Heart RRR 3/4 systolic murmur at left lower sternal border, norubs or gallops. No peripheral edema.   GASTROINTESTINAL: Abdomen is soft, non-tender, not distended w/ normal bowel sounds. GENITOURINARY: Bladder non tender, not distended  MUSCULOSKELETAL: No abnormal joints or musculature NEUROLOGIC:  Cranial nerves 2-12 grossly intact. Moves all extremities  PSYCHIATRIC: Mood and affect appropriate to situation, no behavioral issues  Patient Active Problem List   Diagnosis Date Noted  . Acute respiratory failure ([place]) 10/17/2017  . Status post total replacement of right hip 07/11/2017  . Pain of right hip joint 05/05/2017  . DOE (dyspnea on exertion) 02/21/2017  . Asthma, chronic, moderate persistent, uncomplicated 23/55/7322  . Chronic respiratory failure with hypoxia and hypercapnia ([place]) 02/21/2017  . Hypertensive urgency 01/30/2017  . Dizziness 01/30/2017  . Hyponatremia with extracellular fluid depletion 01/30/2017  . Hyponatremia 01/30/2017  . Lumbar spondylosis 01/28/2017  . Unilateral primary osteoarthritis,  right hip 01/28/2017  . Arthritis   . Asthma   . Cataract   . Colon cancer ([place])   . COPD (chronic obstructive pulmonary disease) ([place])   . Diabetes mellitus without complication ([place])   . Diverticulitis   . DVT (deep vein thrombosis) in pregnancy ([place])   . Essential hypertension   . GERD (gastroesophageal reflux disease)   . Hearing impaired   . Hyperlipidemia   . Hypertension   . TIA (transient ischemic attack)   . Seasonal allergies  Labs reviewed: Basic Metabolic Panel:    Component Value Date/Time   NA 135 10/22/2017 0355   NA 138 02/23/2016   K 3.6 10/22/2017 0355   CL 86 (L) 10/22/2017 0355   CO2 37 (H) 10/22/2017 0355   GLUCOSE 103 (H) 10/22/2017 0355   BUN 20 10/22/2017 0355   BUN 11 02/23/2016   CREATININE 0.84 10/22/2017 0355   CALCIUM 9.0 10/22/2017 0355   PROT 5.5 (L) 10/21/2017 0357   ALBUMIN 2.7 (L) 10/21/2017 0357   AST 24 10/21/2017 0357   ALT 46 10/21/2017 0357   ALKPHOS 75 10/21/2017 0357   BILITOT 0.9 10/21/2017 0357   GFRNONAA 58 (L) 10/22/2017 0355   GFRAA >60 10/22/2017 0355    Recent Labs    10/17/17 2145 10/18/17 0246  10/20/17 0420 10/21/17 0357 10/22/17 0355  NA 131* 132*   < > 133* 135 135  K 5.5* 5.8*   < > 4.8 4.2 3.6  CL 100* 100*   < > 94* 91* 86*  CO2 24 23   < > 30 34* 37*  GLUCOSE 179* 122*   < > 119* 107* 103*  BUN 16 18   < > 28* 24* 20  CREATININE 0.75 0.80   < > 0.86 0.83 0.84  CALCIUM 8.3* 8.5*   < > 8.8* 9.0 9.0  MG 2.1 2.0  --  1.8  --   --   PHOS 5.1* 4.1  --   --  4.5  --    < > = values in this interval not displayed.   Liver Function Tests: Recent Labs    10/16/17 2150 10/21/17 0357  AST 45* 24  ALT 26 46  ALKPHOS 89 75  BILITOT 0.7 0.9  PROT 7.4 5.5*  ALBUMIN 4.0 2.7*   No results for input(s): LIPASE, AMYLASE in the last 8760 hours. No results for input(s): AMMONIA in the last 8760 hours. CBC: Recent Labs    05/09/17 1201  10/16/17 2150  10/19/17 0336 10/20/17 0420 10/21/17 0357    WBC 7.0   < > 18.7*   < > 10.8* 9.2 8.2  NEUTROABS 4.8  --  11.9*  --   --   --   --   HGB 13.1   < > 14.6   < > 10.2* 10.6* 10.4*  HCT 39.3   < > 46.3*   < > 31.6* 34.4* 33.2*  MCV 95.6   < > 92.0   < > 92.9 91.7 91.5  PLT 233.0   < > 248   < > 139* 182 203   < > = values in this interval not displayed.   Lipid No results for input(s): CHOL, HDL, LDLCALC, TRIG in the last 8760 hours.  Cardiac Enzymes: No results for input(s): CKTOTAL, CKMB, CKMBINDEX, TROPONINI in the last 8760 hours. BNP: Recent Labs    10/17/17 0036 10/17/17 0611 10/18/17 0749  BNP 665.0* 845.9* 1,148.3*   No results found for: [facility] Lab Results  Component Value Date   HGBA1C 5.7 (H) 07/01/2017   Lab Results  Component Value Date   TSH 1.79 02/23/2016   No results found for: VITAMINB12 No results found for: FOLATE No results found for: IRON, TIBC, FERRITIN  Imaging and Procedures obtained prior to SNF admission: Dg Chest Port 1 View  Result Date: 10/16/2017 CLINICAL DATA:  Initial evaluation for intubation. EXAM: PORTABLE CHEST 1 VIEW COMPARISON:  Prior radiograph from 01/31/2017. FINDINGS: Endotracheal tube in place with tip well positioned  2.7 cm above the carina. Tip of an apparent additional enteric tube positioned above the thoracic inlet, which may be in the esophagus or airway. Stable cardiomegaly. Mediastinal silhouette normal. Aortic atherosclerosis. Failing opacities overlying the bilateral hemidiaphragms, consistent with effusions. Diffuse vascular congestion with interstitial prominence, consistent with moderate diffuse pulmonary edema. Superimposed bibasilar opacities favored to reflect atelectasis and/or edema. Superimposed infiltrates would be difficult to exclude. No pneumothorax. No acute osseous abnormality. IMPRESSION: 1. Endotracheal tube in place with tip well positioned 2.7 cm above the carina. 2. Tip of enteric tube positioned above the thoracic inlet. This may lie within the  trachea or esophagus. Repositioning recommended. 3. Cardiomegaly with moderate diffuse pulmonary edema and bilateral pleural effusions, suggesting CHF. Superimposed bibasilar opacities favored to reflect atelectasis/edema. Infiltrates could be considered in the correct clinical setting. Electronically Signed   By: Jeannine Boga M.D.   On: 10/16/2017 22:47   Dg Abd Portable 1 View  Result Date: 10/17/2017 CLINICAL DATA:  OG tube placement. EXAM: PORTABLE ABDOMEN - 1 VIEW COMPARISON:  None. FINDINGS: Tip and side-port of the enteric tube below the diaphragm in the stomach. No bowel obstruction in the visualized upper abdomen. Bibasilar opacities and perihilar edema, as seen on recent chest radiograph. IMPRESSION: Tip and side port of the enteric tube in the stomach Electronically Signed   By: Jeb Levering M.D.   On: 10/17/2017 01:35     Not all labs, radiology exams or other studies done during hospitalization come through on my EPIC note; however they are reviewed by me.    Assessment and Plan  Acute respiratory failure with hypoxia and hypercapnia/acute on chronic diastolic congestive heart failure/severe mitral regurgitation/ulmonary hypertension/hypertension- SNF - admitted for OT/PT; continue Lasix 40 mgtwice a day,bisoprolol 2.5 mg daily, BiDil 20-37.5 twice a day, ASA 81 mg 2 times a week, and Plavix 75 mg daily  URI with cough SNF - ontinue Flonase  diabetes mellitus type 2  SNF _ sliding scale insulin  thrombocytopenia-mild SNF - dC platelet 203, normal; follow-up CBC  Neuropathy SNF-not stated as uncontrolled; continue Neurontin 100 mg by mouth daily  GERD SNF - not stated as uncontrolled; continue DEXA later 60 mg by mouth daily  Anemia, iron deficiency SNF - DC hemoglobin 10.4; continue iron 325 mg Wednesday and Friday; follow-up CBC   Time spent  Greater than 45 minutes;> 50% of time with patient was spent reviewing records, labs, tests and studies, counseling  and developing plan of care  Webb Silversmith D. [name],  MD

## 2017-10-25 ENCOUNTER — Encounter: Payer: Self-pay | Admitting: Internal Medicine

## 2017-10-25 DIAGNOSIS — I340 Nonrheumatic mitral (valve) insufficiency: Secondary | ICD-10-CM | POA: Insufficient documentation

## 2017-10-25 DIAGNOSIS — J069 Acute upper respiratory infection, unspecified: Secondary | ICD-10-CM | POA: Insufficient documentation

## 2017-10-25 DIAGNOSIS — I5031 Acute diastolic (congestive) heart failure: Secondary | ICD-10-CM | POA: Insufficient documentation

## 2017-10-25 DIAGNOSIS — J9602 Acute respiratory failure with hypercapnia: Principal | ICD-10-CM

## 2017-10-25 DIAGNOSIS — D509 Iron deficiency anemia, unspecified: Secondary | ICD-10-CM | POA: Insufficient documentation

## 2017-10-25 DIAGNOSIS — E1140 Type 2 diabetes mellitus with diabetic neuropathy, unspecified: Secondary | ICD-10-CM | POA: Insufficient documentation

## 2017-10-25 DIAGNOSIS — D696 Thrombocytopenia, unspecified: Secondary | ICD-10-CM | POA: Insufficient documentation

## 2017-10-25 DIAGNOSIS — I270 Primary pulmonary hypertension: Secondary | ICD-10-CM | POA: Insufficient documentation

## 2017-10-25 DIAGNOSIS — J9601 Acute respiratory failure with hypoxia: Secondary | ICD-10-CM | POA: Insufficient documentation

## 2017-10-27 ENCOUNTER — Non-Acute Institutional Stay (SKILLED_NURSING_FACILITY): Payer: Medicare Other | Admitting: Internal Medicine

## 2017-10-27 ENCOUNTER — Encounter: Payer: Self-pay | Admitting: Internal Medicine

## 2017-10-27 DIAGNOSIS — J40 Bronchitis, not specified as acute or chronic: Secondary | ICD-10-CM

## 2017-10-27 DIAGNOSIS — I959 Hypotension, unspecified: Secondary | ICD-10-CM

## 2017-10-27 DIAGNOSIS — I9589 Other hypotension: Secondary | ICD-10-CM

## 2017-10-27 LAB — CBC AND DIFFERENTIAL
HEMATOCRIT: 35 — AB (ref 36–46)
HEMOGLOBIN: 11.9 — AB (ref 12.0–16.0)
Platelets: 220 (ref 150–399)
WBC: 18.5

## 2017-10-27 LAB — BASIC METABOLIC PANEL
BUN: 12 (ref 4–21)
CALCIUM: 9.3
Creat: 0.86
Glucose: 103
POTASSIUM: 4
Sodium: 137

## 2017-10-27 LAB — CBC
HCT: 34.5
HGB: 11.9
PLATELET COUNT: 220
WBC: 18.5

## 2017-10-27 NOTE — Progress Notes (Signed)
[name].Nicole Coil, MD Location:  [facility] Room Number: 100P Place of Service:  SNF [phone])  Nafziger, Tommi Rumps, NP  Patient Care Team: [name], NP as PCP - General (Family Medicine)  Extended Emergency Contact Information Primary Emergency Contact: Pope,Kathy Address: [address]          [place],  [address] Johnnette Litter [place] Phone: [phone] Mobile Phone: [phone] Relation: Daughter Secondary Emergency Contact: Pope,David Mobile Phone: [phone] Relation: Son    Allergies: Albuterol; Prochlorperazine; Celecoxib; Codeine; Fish oil; Fosamax [alendronate sodium]; Morphine and related; Moxifloxacin; Oxycodone; Pregabalin; Propoxyphene; Statins; Aspirin-dipyridamole er; and Buprenorphine hcl  Chief Complaint  Patient presents with  . Acute Visit    Cough    HPI: Patient is 84 y.o. female who asked me to see for development of a cough productive of yellow sputum and chest congestion with central chest pain with cough and burning in central chest with cough.  Was noted over the weekend patient had low-grade fever once over the weekend but none since.  She is on 2 L nasal cannula with O2 saturation of 94%.  Patient has also been running low blood pressures so I am looking at this as well.  Past Medical History:  Diagnosis Date  . Anemia    iron deficiency  . Arthritis   . Asthma   . Cataract   . Chronic respiratory failure with hypoxia ([place])   . Colitis   . Colon cancer ([place])    basal cell nose  . Diabetes mellitus without complication ([place])    NO meds ever [place] related to steroid use. Has more problems eith hypoglycemia  . Diverticulitis   . DVT (deep venous thrombosis) ([place])   . Essential hypertension   . Family history of adverse reaction to anesthesia    both daughters have problems getting bp up after anesthesia  . GERD (gastroesophageal reflux disease)   . Hearing impaired   . Hiatal hernia    neuropathy  . History of kidney stones   . Hyperlipidemia   . Hypertension   . Pneumonia   . PONV (postoperative nausea and vomiting)    BP drops really low cant breathe well vomiting  . Seasonal allergies   . TIA (transient ischemic attack)    x2 2 years ago    Past Surgical History:  Procedure Laterality Date  . APPENDECTOMY    . CHOLECYSTECTOMY    . COLON RESECTION  1967  . EYE SURGERY     cataract  . JOINT REPLACEMENT     Right hip Dr. Ninfa Linden 07/11/17  . KIDNEY SURGERY  1966   cyst  . TONSILLECTOMY    . TOTAL ABDOMINAL HYSTERECTOMY    . TOTAL HIP ARTHROPLASTY Right 07/11/2017   Procedure: RIGHT TOTAL HIP ARTHROPLASTY ANTERIOR APPROACH;  Surgeon: Mcarthur Rossetti, MD;  Location: [facility];  Service: Orthopedics;  Laterality: Right;    Allergies as of 10/27/2017      Reactions   Albuterol Anxiety   Tachycardia, Tachypnea   Prochlorperazine Other (See Comments)   tremor   Celecoxib Nausea And Vomiting, Other (See Comments)   Body aches   Codeine Nausea And Vomiting   Tolerates hydrocodone   Fish Oil Nausea And Vomiting   Fosamax [alendronate Sodium] Nausea And Vomiting   Morphine And Related Nausea And Vomiting   Moxifloxacin Other (See Comments)   fatigue   Oxycodone Nausea And Vomiting   Tolerates hydrocodone   Pregabalin Swelling  Propoxyphene Nausea And Vomiting   Statins Other (See Comments)   Muscle pain   Aspirin-dipyridamole Er Other (See Comments)   Headcache   Buprenorphine Hcl Nausea And Vomiting      Medication List        Accurate as of 10/27/17 11:59 PM. Always use your most recent med list.          acetaminophen 500 MG tablet Commonly known as:  TYLENOL Take 500 mg by mouth daily.   aspirin EC 81 MG tablet Take 81 mg by mouth 2 (two) times a week. on Sunday and Thursday only.   bisoprolol 5 MG tablet Commonly known as:  ZEBETA Take 0.5 tablets (2.5 mg total) by mouth daily.   CALCIUM 600+D PLUS MINERALS 600-400 MG-UNIT  Chew Chew 2 tablets every morning by mouth.   clopidogrel 75 MG tablet Commonly known as:  PLAVIX Take 1 tablet (75 mg total) by mouth daily.   dexlansoprazole 60 MG capsule Commonly known as:  DEXILANT TAKE 1 CAPSULE BY MOUTH EVERY MORNING BEFORE BREAKFAST.   dimenhyDRINATE 50 MG tablet Commonly known as:  DRAMAMINE Take 50 mg every 8 (eight) hours as needed by mouth (for dizziness/vertigo).   ENSURE Take 237 mLs by mouth daily.   ferrous sulfate 325 (65 FE) MG tablet Take 325 mg by mouth every Monday, Wednesday, and Friday. In the morning with breakfast.   fluticasone 50 MCG/ACT nasal spray Commonly known as:  FLONASE Place 1 spray 2 (two) times daily as needed into both nostrils (for allergies.).   fluticasone furoate-vilanterol 100-25 MCG/INH Aepb Commonly known as:  BREO ELLIPTA Inhale 1 puff into the lungs daily.   furosemide 40 MG tablet Commonly known as:  LASIX Take 1 tablet (40 mg total) by mouth 2 (two) times daily.   gabapentin 100 MG capsule Commonly known as:  NEURONTIN Take 1 capsule (100 mg total) by mouth daily.   ipratropium 0.03 % nasal spray Commonly known as:  ATROVENT Place 1 spray into both nostrils at bedtime.   isosorbide mononitrate 10 MG tablet Commonly known as:  ISMO,MONOKET Take 10 mg by mouth 2 (two) times daily.   loratadine 10 MG tablet Commonly known as:  CLARITIN Take 10 mg by mouth daily.   LUBRICANT EYE DROPS 0.4-0.3 % Soln Generic drug:  Polyethyl Glycol-Propyl Glycol Apply 1 drop to eye 4 (four) times daily.   omeprazole 40 MG capsule Commonly known as:  PRILOSEC Take 40 mg by mouth daily.   ondansetron 4 MG tablet Commonly known as:  ZOFRAN Take 4 mg by mouth 3 (three) times daily.   polyethylene glycol packet Commonly known as:  MIRALAX / GLYCOLAX Take 17 g by mouth daily.   PROBIOTIC PO Take 1 capsule daily with lunch by mouth.   sodium chloride 0.65 % Soln nasal spray Commonly known as:  OCEAN Place 1  spray into both nostrils 4 (four) times daily as needed for congestion.   Vitamin D3 10000 units Tabs Take 10,000 Units 4 (four) times a week by mouth. Monday, Tuesday, Wednesday, & Thursday       No orders of the defined types were placed in this encounter.   Immunization History  Administered Date(s) Administered  . Influenza Split 07/03/2011, 06/16/2012  . Influenza, High Dose Seasonal PF 03/28/2017  . Influenza, Seasonal, Injecte, Preservative Fre 08/12/2012  . Influenza-Unspecified 04/15/2014, 04/13/2015, 05/04/2016  . Pneumococcal Conjugate-13 10/17/2015  . Pneumococcal Polysaccharide-23 11/26/2016  . Zoster 08/12/2012    Social History  Tobacco Use  . Smoking status: Former Smoker    Types: Cigarettes  . Smokeless tobacco: Never Used  Substance Use Topics  . Alcohol use: No    Review of Systems  DATA OBTAINED: from patient and nurse-both as per history of present illness GENERAL: Fever once, fatigue, appetite changes SKIN: No itching, rash HEENT: No complaint RESPIRATORY: + cough, NO wheezing, SOB CARDIAC: Central chest pain with cough, no palpitations, lower extremity edema  GI: No abdominal pain, No N/V/D or constipation, No heartburn or reflux  GU: No dysuria, frequency or urgency, or incontinence  MUSCULOSKELETAL: No unrelieved bone/joint pain NEUROLOGIC: No headache, dizziness  PSYCHIATRIC: No overt anxiety or sadness  Vitals:   10/27/17 1216  BP: 137/72  Pulse: 88  Resp: 18  Temp: 98.3 F (36.8 C)  SpO2: 94%   Body mass index is 21.8 kg/m. Physical Exam  GENERAL APPEARANCE: Alert, conversant, No acute distress  SKIN: No diaphoresis rash HEENT: Unremarkable RESPIRATORY: Breathing is even, unlabored. Lung sounds are coarse; O2 saturation at bedside with 2 L is 94% CARDIOVASCULAR: Heart RRR no murmurs, rubs or gallops. No peripheral edema  GASTROINTESTINAL: Abdomen is soft, non-tender, not distended w/ normal bowel sounds.  GENITOURINARY:  Bladder non tender, not distended  MUSCULOSKELETAL: No abnormal joints or musculature NEUROLOGIC: Cranial nerves 2-12 grossly intact. Moves all extremities PSYCHIATRIC: Mood and affect appropriate to situation, no behavioral issues  Patient Active Problem List   Diagnosis Date Noted  . Acute respiratory failure with hypoxia and hypercapnia ([place]) 10/25/2017  . Acute on chronic diastolic (congestive) heart failure ([place]) 10/25/2017  . Mitral regurgitation 10/25/2017  . Pulmonary hypertension, primary ([place]) 10/25/2017  . URI (upper respiratory infection) 10/25/2017  . Anemia, iron deficiency 10/25/2017  . Type 2 diabetes mellitus with diabetic neuropathy, unspecified ([place]) 10/25/2017  . Thrombocytopenia ([place]) 10/25/2017  . Acute respiratory failure ([place]) 10/17/2017  . Status post total replacement of right hip 07/11/2017  . Pain of right hip joint 05/05/2017  . DOE (dyspnea on exertion) 02/21/2017  . Asthma, chronic, moderate persistent, uncomplicated 72/53/6644  . Chronic respiratory failure with hypoxia and hypercapnia ([place]) 02/21/2017  . Hypertensive urgency 01/30/2017  . Dizziness 01/30/2017  . Hyponatremia with extracellular fluid depletion 01/30/2017  . Hyponatremia 01/30/2017  . Lumbar spondylosis 01/28/2017  . Unilateral primary osteoarthritis, right hip 01/28/2017  . Arthritis   . Asthma   . Cataract   . Colon cancer ([place])   . COPD (chronic obstructive pulmonary disease) ([place])   . Diabetes mellitus without complication ([place])   . Diverticulitis   . DVT (deep vein thrombosis) in pregnancy ([place])   . Essential hypertension   . GERD (gastroesophageal reflux disease)   . Hearing impaired   . Hyperlipidemia   . Hypertension   . TIA (transient ischemic attack)   . Seasonal allergies     CMP     Component Value Date/Time   NA 137 10/27/2017   K 4.0 10/27/2017   CL 86 (L) 10/22/2017 0355   CO2 37 (H) 10/22/2017 0355   GLUCOSE 103 (H) 10/22/2017 0355   BUN 12 10/27/2017     CREATININE 0.86 10/27/2017   CALCIUM 9.3 10/27/2017   PROT 5.5 (L) 10/21/2017 0357   ALBUMIN 2.7 (L) 10/21/2017 0357   AST 24 10/21/2017 0357   ALT 46 10/21/2017 0357   ALKPHOS 75 10/21/2017 0357   BILITOT 0.9 10/21/2017 0357   GFRNONAA 58 (L) 10/22/2017 0355   GFRAA >60 10/22/2017 0355   Recent Labs  10/17/17 2145 10/18/17 0246  10/20/17 0420 10/21/17 0357 10/22/17 0355 10/27/17  NA 131* 132*   < > 133* 135 135 137  K 5.5* 5.8*   < > 4.8 4.2 3.6 4.0  CL 100* 100*   < > 94* 91* 86*  --   CO2 24 23   < > 30 34* 37*  --   GLUCOSE 179* 122*   < > 119* 107* 103*  --   BUN 16 18   < > 28* 24* 20 12  CREATININE 0.75 0.80   < > 0.86 0.83 0.84 0.86  CALCIUM 8.3* 8.5*   < > 8.8* 9.0 9.0 9.3  MG 2.1 2.0  --  1.8  --   --   --   PHOS 5.1* 4.1  --   --  4.5  --   --    < > = values in this interval not displayed.   Recent Labs    10/16/17 2150 10/21/17 0357  AST 45* 24  ALT 26 46  ALKPHOS 89 75  BILITOT 0.7 0.9  PROT 7.4 5.5*  ALBUMIN 4.0 2.7*   Recent Labs    05/09/17 1201  10/16/17 2150  10/19/17 0336 10/20/17 0420 10/21/17 0357 10/27/17  WBC 7.0   < > 18.7*   < > 10.8* 9.2 8.2 18.5  NEUTROABS 4.8  --  11.9*  --   --   --   --   --   HGB 13.1   < > 14.6   < > 10.2* 10.6* 10.4* 11.9  HCT 39.3   < > 46.3*   < > 31.6* 34.4* 33.2* 34.5  MCV 95.6   < > 92.0   < > 92.9 91.7 91.5  --   PLT 233.0   < > 248   < > 139* 182 203  --    < > = values in this interval not displayed.   No results for input(s): CHOL, LDLCALC, TRIG in the last 8760 hours.  Invalid input(s): HCL No results found for: [facility] Lab Results  Component Value Date   TSH 1.79 02/23/2016   Lab Results  Component Value Date   HGBA1C 5.7 (H) 07/01/2017   Lab Results  Component Value Date   CHOL 204 (A) 02/23/2016   HDL 72 (A) 02/23/2016   LDLCALC 111 02/23/2016   TRIG 103 02/23/2016    Significant Diagnostic Results in last 30 days:  Dg Chest Port 1 View  Result Date:  10/19/2017 CLINICAL DATA:  CHF EXAM: PORTABLE CHEST 1 VIEW COMPARISON:  10/18/2017 FINDINGS: Cardiomegaly. Moderate bilateral pleural effusions. Diffuse bilateral airspace disease again noted, not significantly changed. IMPRESSION: Continued diffuse bilateral airspace disease which could reflect edema or infection. Stable moderate effusions. Electronically Signed   By: Rolm Baptise M.D.   On: 10/19/2017 07:16   Dg Chest Port 1 View  Result Date: 10/18/2017 CLINICAL DATA:  Respiratory failure. EXAM: PORTABLE CHEST 1 VIEW COMPARISON:  10/16/2017 FINDINGS: Endotracheal and enteric tubes have been removed. The cardiac silhouette remains mildly enlarged. Aortic atherosclerosis is noted. Veiling opacities in the right greater than left lung bases obscure the hemidiaphragms and have increased from prior. There is pulmonary vascular congestion with diffuse interstitial and patchy airspace opacities in both lungs, similar to the prior study. No pneumothorax is identified. IMPRESSION: 1. Interval extubation. 2. Increasing bilateral pleural effusions. 3. Similar diffuse interstitial and patchy airspace opacities in both lungs which may reflect edema or pneumonia. Electronically Signed  By: Logan Bores M.D.   On: 10/18/2017 07:48   Dg Chest Port 1 View  Result Date: 10/16/2017 CLINICAL DATA:  Initial evaluation for intubation. EXAM: PORTABLE CHEST 1 VIEW COMPARISON:  Prior radiograph from 01/31/2017. FINDINGS: Endotracheal tube in place with tip well positioned 2.7 cm above the carina. Tip of an apparent additional enteric tube positioned above the thoracic inlet, which may be in the esophagus or airway. Stable cardiomegaly. Mediastinal silhouette normal. Aortic atherosclerosis. Failing opacities overlying the bilateral hemidiaphragms, consistent with effusions. Diffuse vascular congestion with interstitial prominence, consistent with moderate diffuse pulmonary edema. Superimposed bibasilar opacities favored to reflect  atelectasis and/or edema. Superimposed infiltrates would be difficult to exclude. No pneumothorax. No acute osseous abnormality. IMPRESSION: 1. Endotracheal tube in place with tip well positioned 2.7 cm above the carina. 2. Tip of enteric tube positioned above the thoracic inlet. This may lie within the trachea or esophagus. Repositioning recommended. 3. Cardiomegaly with moderate diffuse pulmonary edema and bilateral pleural effusions, suggesting CHF. Superimposed bibasilar opacities favored to reflect atelectasis/edema. Infiltrates could be considered in the correct clinical setting. Electronically Signed   By: Jeannine Boga M.D.   On: 10/16/2017 22:47   Dg Abd Portable 1 View  Result Date: 10/17/2017 CLINICAL DATA:  OG tube placement. EXAM: PORTABLE ABDOMEN - 1 VIEW COMPARISON:  None. FINDINGS: Tip and side-port of the enteric tube below the diaphragm in the stomach. No bowel obstruction in the visualized upper abdomen. Bibasilar opacities and perihilar edema, as seen on recent chest radiograph. IMPRESSION: Tip and side port of the enteric tube in the stomach Electronically Signed   By: Jeb Levering M.D.   On: 10/17/2017 01:35    Assessment and Plan  Cough/bronchitis-patient's cough sounded like bronchitis, no wheezing: Chest x-ray and CBC was ordered; chest x-ray not available but CBC returned with a white blood cell count of 18.5 so I started treatment for her with Zithromax for 5 days, I did not feel like she was sick enough for Levaquin, but will monitor response  Low blood pressures-patient's diastolic blood pressures have been running from 102-80: IDC patient's bidil started isosorbide 10 mg twice daily; continue the bisoprolol 2.5 mg daily; monitor response     Webb Silversmith D. Nicole Coil, MD

## 2017-10-30 ENCOUNTER — Encounter: Payer: Self-pay | Admitting: *Deleted

## 2017-10-30 ENCOUNTER — Encounter: Payer: Self-pay | Admitting: Adult Health

## 2017-11-01 ENCOUNTER — Encounter: Payer: Self-pay | Admitting: Internal Medicine

## 2017-11-03 ENCOUNTER — Non-Acute Institutional Stay (SKILLED_NURSING_FACILITY): Payer: Medicare Other | Admitting: Internal Medicine

## 2017-11-03 DIAGNOSIS — Z7189 Other specified counseling: Secondary | ICD-10-CM | POA: Diagnosis not present

## 2017-11-03 DIAGNOSIS — I340 Nonrheumatic mitral (valve) insufficiency: Principal | ICD-10-CM

## 2017-11-03 DIAGNOSIS — I342 Nonrheumatic mitral (valve) stenosis: Secondary | ICD-10-CM

## 2017-11-04 ENCOUNTER — Telehealth: Payer: Self-pay | Admitting: Cardiovascular Disease

## 2017-11-04 ENCOUNTER — Non-Acute Institutional Stay (SKILLED_NURSING_FACILITY): Payer: Medicare Other | Admitting: Internal Medicine

## 2017-11-04 ENCOUNTER — Encounter: Payer: Self-pay | Admitting: Internal Medicine

## 2017-11-04 DIAGNOSIS — R7981 Abnormal blood-gas level: Secondary | ICD-10-CM

## 2017-11-04 DIAGNOSIS — E878 Other disorders of electrolyte and fluid balance, not elsewhere classified: Secondary | ICD-10-CM | POA: Diagnosis not present

## 2017-11-04 DIAGNOSIS — E871 Hypo-osmolality and hyponatremia: Secondary | ICD-10-CM | POA: Diagnosis not present

## 2017-11-04 LAB — BASIC METABOLIC PANEL
BUN: 9 (ref 4–21)
Creatinine: 0.6 (ref 0.5–1.1)
Glucose: 119
Potassium: 3.7 (ref 3.4–5.3)
SODIUM: 126 — AB (ref 137–147)

## 2017-11-04 NOTE — Progress Notes (Signed)
Location:  [facility] Number: 100P Place of Service:  SNF (31)  [name], MD  Patient Care Team: [name], NP as PCP - General [facility] Medicine)  Extended Emergency Contact Information Primary Emergency Contact: Pope,Kathy Address: [address]          [place], [place] [address] Johnnette Litter of [place] Phone: [phone] Mobile Phone: [phone] Relation: Daughter Secondary Emergency Contact: Pope,David Mobile Phone: [phone] Relation: Son    Allergies: Albuterol; Prochlorperazine; Celecoxib; Codeine; Fish oil; Fosamax [alendronate sodium]; Morphine and related; Moxifloxacin; Oxycodone; Pregabalin; Propoxyphene; Statins; Aspirin-dipyridamole er; and Buprenorphine hcl  Chief Complaint  Patient presents with  . Acute Visit    Family Meeting    HPI: Patient is 84 y.o. female who is power of attorney has just arrived from out of town and wishes to speak to me about many issues including patient's blood pressure patient's potassium patient's weight and patient's nausea in particular.  She describes it patient has been very nauseated on and off and describes her nausea more like morning sickness.  Patient does admit to smell of certain foods is very nauseating to her, but she is not thirsty even though she is not eating or drinking very much.  Patient says she just has no appetite at all.  Patient's daughter is worried that her potassium is low and that is why she is feeling nauseated.  She is also worried that she has no energy because her potassium is low.  She is also worried because patient's weight has gone up 5 pounds since yesterday, which I was aware of.  Past Medical History:  Diagnosis Date  . Anemia    iron deficiency  . Arthritis   . Asthma   . Cataract   . Chronic respiratory failure with hypoxia ([place])   . Colitis   . Colon cancer ([place])    basal cell nose  . Diabetes mellitus without complication ([place])      NO meds ever [place] related to steroid use. Has more problems eith hypoglycemia  . Diverticulitis   . DVT (deep venous thrombosis) ([place])   . Essential hypertension   . Family history of adverse reaction to anesthesia    both daughters have problems getting bp up after anesthesia  . GERD (gastroesophageal reflux disease)   . Hearing impaired   . Hiatal hernia    neuropathy  . History of kidney stones   . Hyperlipidemia   . Hypertension   . Pneumonia   . PONV (postoperative nausea and vomiting)    BP drops really low cant breathe well vomiting  . Seasonal allergies   . TIA (transient ischemic attack)    x2 2 years ago    Past Surgical History:  Procedure Laterality Date  . APPENDECTOMY    . CHOLECYSTECTOMY    . COLON RESECTION  1967  . EYE SURGERY     cataract  . JOINT REPLACEMENT     Right hip Dr. Ninfa Linden 07/11/17  . KIDNEY SURGERY  1966   cyst  . TONSILLECTOMY    . TOTAL ABDOMINAL HYSTERECTOMY    . TOTAL HIP ARTHROPLASTY Right 07/11/2017   Procedure: RIGHT TOTAL HIP ARTHROPLASTY ANTERIOR APPROACH;  Surgeon: Mcarthur Rossetti, MD;  Location: [facility] ORS;  Service: Orthopedics;  Laterality: Right;    Allergies as of 11/03/2017      Reactions   Albuterol Anxiety   Tachycardia, Tachypnea   Prochlorperazine Other (See Comments)   tremor  Celecoxib Nausea And Vomiting, Other (See Comments)   Body aches   Codeine Nausea And Vomiting   Tolerates hydrocodone   Fish Oil Nausea And Vomiting   Fosamax [alendronate Sodium] Nausea And Vomiting   Morphine And Related Nausea And Vomiting   Moxifloxacin Other (See Comments)   fatigue   Oxycodone Nausea And Vomiting   Tolerates hydrocodone   Pregabalin Swelling   Propoxyphene Nausea And Vomiting   Statins Other (See Comments)   Muscle pain   Aspirin-dipyridamole Er Other (See Comments)   Headcache   Buprenorphine Hcl Nausea And Vomiting      Medication List        Accurate as of 11/03/17 11:59 PM. Always use your  most recent med list.          acetaminophen 500 MG tablet Commonly known as:  TYLENOL Take 500 mg by mouth daily.   aspirin EC 81 MG tablet Take 81 mg by mouth 2 (two) times a week. on Sunday and Thursday only.   bisoprolol 5 MG tablet Commonly known as:  ZEBETA Take 0.5 tablets (2.5 mg total) by mouth daily.   CALCIUM 600+D PLUS MINERALS 600-400 MG-UNIT Chew Chew 2 tablets every morning by mouth.   clopidogrel 75 MG tablet Commonly known as:  PLAVIX Take 1 tablet (75 mg total) by mouth daily.   dexlansoprazole 60 MG capsule Commonly known as:  DEXILANT TAKE 1 CAPSULE BY MOUTH EVERY MORNING BEFORE BREAKFAST.   dimenhyDRINATE 50 MG tablet Commonly known as:  DRAMAMINE Take 50 mg every 8 (eight) hours as needed by mouth (for dizziness/vertigo).   feeding supplement Liqd Commonly known as:  BOOST / RESOURCE BREEZE Take 1 Container by mouth 2 (two) times daily.   ferrous sulfate 325 (65 FE) MG tablet Take 325 mg by mouth every Monday, Wednesday, and Friday. In the morning with breakfast.   fluticasone 50 MCG/ACT nasal spray Commonly known as:  FLONASE Place 1 spray 2 (two) times daily as needed into both nostrils (for allergies.).   fluticasone furoate-vilanterol 100-25 MCG/INH Aepb Commonly known as:  BREO ELLIPTA Inhale 1 puff into the lungs daily.   furosemide 40 MG tablet Commonly known as:  LASIX Take 1 tablet (40 mg total) by mouth 2 (two) times daily.   gabapentin 100 MG capsule Commonly known as:  NEURONTIN Take 1 capsule (100 mg total) by mouth daily.   ipratropium 0.03 % nasal spray Commonly known as:  ATROVENT Place 1 spray into both nostrils at bedtime.   isosorbide mononitrate 10 MG tablet Commonly known as:  ISMO,MONOKET Take 10 mg by mouth 2 (two) times daily.   loratadine 10 MG tablet Commonly known as:  CLARITIN Take 10 mg by mouth daily.   LUBRICANT EYE DROPS 0.4-0.3 % Soln Generic drug:  Polyethyl Glycol-Propyl Glycol Apply 1 drop to  eye 4 (four) times daily.   omeprazole 40 MG capsule Commonly known as:  PRILOSEC Take 40 mg by mouth daily.   ondansetron 8 MG tablet Commonly known as:  ZOFRAN Take 8 mg by mouth every 8 (eight) hours.   polyethylene glycol packet Commonly known as:  MIRALAX / GLYCOLAX Take 17 g by mouth daily.   Probiotic 250 MG Caps Take 1 capsule by mouth daily with lunch.   sodium chloride 0.65 % Soln nasal spray Commonly known as:  OCEAN Place 1 spray into both nostrils 4 (four) times daily as needed for congestion.   Vitamin D3 10000 units Tabs Take 10,000 Units 4 (four)  times a week by mouth. Monday, Tuesday, Wednesday, & Thursday       No orders of the defined types were placed in this encounter.   Immunization History  Administered Date(s) Administered  . Influenza Split 07/03/2011, 06/16/2012  . Influenza, High Dose Seasonal PF 03/28/2017  . Influenza, Seasonal, Injecte, Preservative Fre 08/12/2012  . Influenza-Unspecified 04/15/2014, 04/13/2015, 05/04/2016  . Pneumococcal Conjugate-13 10/17/2015  . Pneumococcal Polysaccharide-23 11/26/2016  . Zoster 08/12/2012    Social History   Tobacco Use  . Smoking status: Former Smoker    Types: Cigarettes  . Smokeless tobacco: Never Used  Substance Use Topics  . Alcohol use: No    Review of Systems  DATA OBTAINED: from patient,  family member GENERAL:  no fevers,+ fatigue,+ appetite changes SKIN: No itching, rash HEENT: No complaint RESPIRATORY: No cough, wheezing, SOB CARDIAC: No chest pain, palpitations, lower extremity edema  GI: No abdominal pain, + N/V, no D or constipation, No heartburn or reflux  GU: No dysuria, frequency or urgency, or incontinence  MUSCULOSKELETAL: No unrelieved bone/joint pain NEUROLOGIC: No headache, dizziness  PSYCHIATRIC: No overt anxiety or sadness  Vitals:   11/03/17 1440  BP: 115/63  Pulse: 80  Resp: 20  Temp: 97.8 F (36.6 C)  SpO2: 98%   Body mass index is 21.9  kg/m. Physical Exam  GENERAL APPEARANCE: Alert, conversant, No acute distress  SKIN: No diaphoresis rash HEENT: Unremarkable RESPIRATORY: Breathing is even, unlabored. Lung sounds are clear   CARDIOVASCULAR: Heart RRR 4/6 systolic murmur, no, rubs or gallops.  Face peripheral edema at ankles GASTROINTESTINAL: Abdomen is soft, non-tender, not distended w/ normal bowel sounds.  GENITOURINARY: Bladder non tender, not distended  MUSCULOSKELETAL: No abnormal joints or musculature NEUROLOGIC: Cranial nerves 2-12 grossly intact. Moves all extremities PSYCHIATRIC: Mood and affect appropriate to situation, no behavioral issues  Patient Active Problem List   Diagnosis Date Noted  . Acute respiratory failure with hypoxia and hypercapnia ([place]) 10/25/2017  . Acute on chronic diastolic (congestive) heart failure ([place]) 10/25/2017  . Mitral regurgitation 10/25/2017  . Pulmonary hypertension, primary ([place]) 10/25/2017  . URI (upper respiratory infection) 10/25/2017  . Anemia, iron deficiency 10/25/2017  . Type 2 diabetes mellitus with diabetic neuropathy, unspecified ([place]) 10/25/2017  . Thrombocytopenia ([place]) 10/25/2017  . Acute respiratory failure ([place]) 10/17/2017  . Status post total replacement of right hip 07/11/2017  . Pain of right hip joint 05/05/2017  . DOE (dyspnea on exertion) 02/21/2017  . Asthma, chronic, moderate persistent, uncomplicated 02/02/7627  . Chronic respiratory failure with hypoxia and hypercapnia ([place]) 02/21/2017  . Hypertensive urgency 01/30/2017  . Dizziness 01/30/2017  . Hyponatremia with extracellular fluid depletion 01/30/2017  . Hyponatremia 01/30/2017  . Lumbar spondylosis 01/28/2017  . Unilateral primary osteoarthritis, right hip 01/28/2017  . Arthritis   . Asthma   . Cataract   . Colon cancer ([place])   . COPD (chronic obstructive pulmonary disease) ([place])   . Diabetes mellitus without complication ([place])   . Diverticulitis   . DVT (deep vein thrombosis) in  pregnancy ([place])   . Essential hypertension   . GERD (gastroesophageal reflux disease)   . Hearing impaired   . Hyperlipidemia   . Hypertension   . TIA (transient ischemic attack)   . Seasonal allergies     CMP     Component Value Date/Time   NA 126 (A) 11/04/2017   NA 137 10/27/2017   K 3.7 11/04/2017   K 4.0 10/27/2017   CL 86 (L) 10/22/2017 0355  CO2 37 (H) 10/22/2017 0355   GLUCOSE 103 (H) 10/22/2017 0355   BUN 9 11/04/2017   CREATININE 0.6 11/04/2017   CREATININE 0.86 10/27/2017   CALCIUM 9.3 10/27/2017   PROT 5.5 (L) 10/21/2017 0357   ALBUMIN 2.7 (L) 10/21/2017 0357   AST 24 10/21/2017 0357   ALT 46 10/21/2017 0357   ALKPHOS 75 10/21/2017 0357   BILITOT 0.9 10/21/2017 0357   GFRNONAA 58 (L) 10/22/2017 0355   GFRAA >60 10/22/2017 0355   CL   75  11/04/17   CO2   46  11/04/17   Recent Labs    10/17/17 2145 10/18/17 0246  10/20/17 0420 10/21/17 0357 10/22/17 0355 10/27/17 11/04/17  NA 131* 132*   < > 133* 135 135 137 126*  K 5.5* 5.8*   < > 4.8 4.2 3.6 4.0 3.7  CL 100* 100*   < > 94* 91* 86*  --   --   CO2 24 23   < > 30 34* 37*  --   --   GLUCOSE 179* 122*   < > 119* 107* 103*  --   --   BUN 16 18   < > 28* 24* 20 12 9   CREATININE 0.75 0.80   < > 0.86 0.83 0.84 0.86 0.6  CALCIUM 8.3* 8.5*   < > 8.8* 9.0 9.0 9.3  --   MG 2.1 2.0  --  1.8  --   --   --   --   PHOS 5.1* 4.1  --   --  4.5  --   --   --    < > = values in this interval not displayed.   Recent Labs    10/16/17 2150 10/21/17 0357  AST 45* 24  ALT 26 46  ALKPHOS 89 75  BILITOT 0.7 0.9  PROT 7.4 5.5*  ALBUMIN 4.0 2.7*   Recent Labs    05/09/17 1201  10/16/17 2150  10/19/17 0336 10/20/17 0420 10/21/17 0357 10/27/17  WBC 7.0   < > 18.7*   < > 10.8* 9.2 8.2 18.5  18.5  NEUTROABS 4.8  --  11.9*  --   --   --   --   --   HGB 13.1   < > 14.6   < > 10.2* 10.6* 10.4* 11.9*  11.9  HCT 39.3   < > 46.3*   < > 31.6* 34.4* 33.2* 35*  34.5  MCV 95.6   < > 92.0   < > 92.9 91.7 91.5  --   PLT  233.0   < > 248   < > 139* 182 203 220   < > = values in this interval not displayed.   No results for input(s): CHOL, LDLCALC, TRIG in the last 8760 hours.  Invalid input(s): HCL No results found for: [facility] Lab Results  Component Value Date   TSH 1.79 02/23/2016   Lab Results  Component Value Date   HGBA1C 5.7 (H) 07/01/2017   Lab Results  Component Value Date   CHOL 204 (A) 02/23/2016   HDL 72 (A) 02/23/2016   LDLCALC 111 02/23/2016   TRIG 103 02/23/2016    Significant Diagnostic Results in last 30 days:  Dg Chest Port 1 View  Result Date: 10/19/2017 CLINICAL DATA:  CHF EXAM: PORTABLE CHEST 1 VIEW COMPARISON:  10/18/2017 FINDINGS: Cardiomegaly. Moderate bilateral pleural effusions. Diffuse bilateral airspace disease again noted, not significantly changed. IMPRESSION: Continued diffuse bilateral airspace disease which could reflect edema  or infection. Stable moderate effusions. Electronically Signed   By: Rolm Baptise M.D.   On: 10/19/2017 07:16   Dg Chest Port 1 View  Result Date: 10/18/2017 CLINICAL DATA:  Respiratory failure. EXAM: PORTABLE CHEST 1 VIEW COMPARISON:  10/16/2017 FINDINGS: Endotracheal and enteric tubes have been removed. The cardiac silhouette remains mildly enlarged. Aortic atherosclerosis is noted. Veiling opacities in the right greater than left lung bases obscure the hemidiaphragms and have increased from prior. There is pulmonary vascular congestion with diffuse interstitial and patchy airspace opacities in both lungs, similar to the prior study. No pneumothorax is identified. IMPRESSION: 1. Interval extubation. 2. Increasing bilateral pleural effusions. 3. Similar diffuse interstitial and patchy airspace opacities in both lungs which may reflect edema or pneumonia. Electronically Signed   By: Logan Bores M.D.   On: 10/18/2017 07:48   Dg Chest Port 1 View  Result Date: 10/16/2017 CLINICAL DATA:  Initial evaluation for intubation. EXAM: PORTABLE CHEST 1  VIEW COMPARISON:  Prior radiograph from 01/31/2017. FINDINGS: Endotracheal tube in place with tip well positioned 2.7 cm above the carina. Tip of an apparent additional enteric tube positioned above the thoracic inlet, which may be in the esophagus or airway. Stable cardiomegaly. Mediastinal silhouette normal. Aortic atherosclerosis. Failing opacities overlying the bilateral hemidiaphragms, consistent with effusions. Diffuse vascular congestion with interstitial prominence, consistent with moderate diffuse pulmonary edema. Superimposed bibasilar opacities favored to reflect atelectasis and/or edema. Superimposed infiltrates would be difficult to exclude. No pneumothorax. No acute osseous abnormality. IMPRESSION: 1. Endotracheal tube in place with tip well positioned 2.7 cm above the carina. 2. Tip of enteric tube positioned above the thoracic inlet. This may lie within the trachea or esophagus. Repositioning recommended. 3. Cardiomegaly with moderate diffuse pulmonary edema and bilateral pleural effusions, suggesting CHF. Superimposed bibasilar opacities favored to reflect atelectasis/edema. Infiltrates could be considered in the correct clinical setting. Electronically Signed   By: Jeannine Boga M.D.   On: 10/16/2017 22:47   Dg Abd Portable 1 View  Result Date: 10/17/2017 CLINICAL DATA:  OG tube placement. EXAM: PORTABLE ABDOMEN - 1 VIEW COMPARISON:  None. FINDINGS: Tip and side-port of the enteric tube below the diaphragm in the stomach. No bowel obstruction in the visualized upper abdomen. Bibasilar opacities and perihilar edema, as seen on recent chest radiograph. IMPRESSION: Tip and side port of the enteric tube in the stomach Electronically Signed   By: Jeb Levering M.D.   On: 10/17/2017 01:35    Assessment and Plan  Encounter for family conference with patient present/severe mitral stenosis with flail leaflet- Present was myself, nursing, patient's daughter, patient's son and patient.  We  discussed the patient at length, including all the concerns relayed in the history of present illness.  Patient's most recent potassium was 4 and we planned to draw another BMP tomorrow.  Patient's nausea and approach to food was more like failure to thrive which was worrisome.  Patient had been treated for pneumonia had improved was doing better when she started to have nausea and stopped improving.  I told patient's family that I felt this was probably related to her mitral stenosis and that we needed to get her to the cardiologist as soon as possible.  Patient's daughter said that she would get that done.  I am also having her unit secretary to look into getting an appointment with Dr. Alvester Chou as well.  I also ordered Zofran 8 mg every 8 hours which was basically doubling her Zofran to have ordered and spoken  to the dietitian to consult with the patient about diet of those things that she likes to eat  and that do not make her nauseated.   Time spent greater than 35 minutes Anne D. [name], MD

## 2017-11-04 NOTE — Telephone Encounter (Signed)
Dr. [name] called concerning pt appt with Dr. Gwenlyn Found tomorrow.   Dr. [name] stated pt presented with persistent cough which improved with antibiotics. However, she is c/o nausea. Pt is not drinking or eating. Dr. [name] said it seems like FTT. Pt has CHF. She has mild swelling in bilateral lower extremities. She is on Lasix 40 BID, which Dr. [name] is decreasing to once daily due to recent abnormal lab results.   BMET ordered:  Sodium--126 Potassium--3.7 Chloride--75 CO2--46 BUN--9 Creatinine--0.62  Dr. [name] stated pt is Hyponatremic. Weight has been stable around 127 lb. Pt was referred for CHF and severe mitral stenosis. Dr. [name] said she is not sure what could be the cause of pt symptoms. She had a CXR as well which was negative for PE, which she has had in the past.   Dr. [name] stated she just wanted to call and let Dr. Gwenlyn Found know all of this information for appt tomorrow. Verbalized thanks for listening and sending info to Dr. Gwenlyn Found. Stated to call if Dr. Gwenlyn Found needed any further information.  Routing to Dr. Gwenlyn Found as Juluis Rainier.

## 2017-11-04 NOTE — Progress Notes (Signed)
Location:  [facility] Room Number: 100P Place of Service:  SNF (31) [name], MD  [name], NP  Patient Care Team: [name], NP as PCP - General [facility])  Extended Emergency Contact Information Primary Emergency Contact: Pope,Kathy Address: [address]          [place], [place] [address] Johnnette Litter of [place] Phone: [phone] Mobile Phone: [phone] Relation: Daughter Secondary Emergency Contact: Pope,David Mobile Phone: [phone] Relation: Son    Allergies: Albuterol; Prochlorperazine; Celecoxib; Codeine; Fish oil; Fosamax [alendronate sodium]; Morphine and related; Moxifloxacin; Oxycodone; Pregabalin; Propoxyphene; Statins; Aspirin-dipyridamole er; and Buprenorphine hcl  Chief Complaint  Patient presents with  . Acute Visit    Abnormal labs    HPI: Patient is 84 y.o. female who is being seen acutely for abnormal labs.  Patient's daughter is in her room as well as his son and I spoke to both of them.  Patient herself is unchanged from yesterday.  Of note patient weighed 130 pounds yesterday which had represented a 5 pound weight gain but today she weighs 127.6 which is why I did not increase her Lasix yesterday.  I am glad that I did not because patient sodium today is 126 and her chloride is 75 and CO2 46.  Fortunately her BUN is 9 and her creatinine is 0.62, which makes  No sense.  Yesterday I had also ordered a chest x-ray to see if patient had a pleural effusions again but fortunately the chest x-ray showed only her prior bibasilar infiltrates with clear costophrenic angles.  Past Medical History:  Diagnosis Date  . Anemia    iron deficiency  . Arthritis   . Asthma   . Cataract   . Chronic respiratory failure with hypoxia ([place])   . Colitis   . Colon cancer ([place])    basal cell nose  . Diabetes mellitus without complication ([place])    NO meds ever [place] related to steroid use. Has more  problems eith hypoglycemia  . Diverticulitis   . DVT (deep venous thrombosis) ([place])   . Essential hypertension   . Family history of adverse reaction to anesthesia    both daughters have problems getting bp up after anesthesia  . GERD (gastroesophageal reflux disease)   . Hearing impaired   . Hiatal hernia    neuropathy  . History of kidney stones   . Hyperlipidemia   . Hypertension   . Pneumonia   . PONV (postoperative nausea and vomiting)    BP drops really low cant breathe well vomiting  . Seasonal allergies   . TIA (transient ischemic attack)    x2 2 years ago    Past Surgical History:  Procedure Laterality Date  . APPENDECTOMY    . CHOLECYSTECTOMY    . COLON RESECTION  1967  . EYE SURGERY     cataract  . JOINT REPLACEMENT     Right hip Dr. Ninfa Linden 07/11/17  . KIDNEY SURGERY  1966   cyst  . TONSILLECTOMY    . TOTAL ABDOMINAL HYSTERECTOMY    . TOTAL HIP ARTHROPLASTY Right 07/11/2017   Procedure: RIGHT TOTAL HIP ARTHROPLASTY ANTERIOR APPROACH;  Surgeon: Mcarthur Rossetti, MD;  Location: [facility];  Service: Orthopedics;  Laterality: Right;    Allergies as of 11/04/2017      Reactions   Albuterol Anxiety   Tachycardia, Tachypnea   Prochlorperazine Other (See Comments)   tremor   Celecoxib Nausea And Vomiting, Other (See Comments)  Body aches   Codeine Nausea And Vomiting   Tolerates hydrocodone   Fish Oil Nausea And Vomiting   Fosamax [alendronate Sodium] Nausea And Vomiting   Morphine And Related Nausea And Vomiting   Moxifloxacin Other (See Comments)   fatigue   Oxycodone Nausea And Vomiting   Tolerates hydrocodone   Pregabalin Swelling   Propoxyphene Nausea And Vomiting   Statins Other (See Comments)   Muscle pain   Aspirin-dipyridamole Er Other (See Comments)   Headcache   Buprenorphine Hcl Nausea And Vomiting      Medication List        Accurate as of 11/04/17  3:14 PM. Always use your most recent med list.          acetaminophen 500 MG  tablet Commonly known as:  TYLENOL Take 500 mg by mouth daily.   aspirin EC 81 MG tablet Take 81 mg by mouth 2 (two) times a week. on Sunday and Thursday only.   bisoprolol 5 MG tablet Commonly known as:  ZEBETA Take 0.5 tablets (2.5 mg total) by mouth daily.   CALCIUM 600+D PLUS MINERALS 600-400 MG-UNIT Chew Chew 2 tablets every morning by mouth.   clopidogrel 75 MG tablet Commonly known as:  PLAVIX Take 1 tablet (75 mg total) by mouth daily.   dexlansoprazole 60 MG capsule Commonly known as:  DEXILANT TAKE 1 CAPSULE BY MOUTH EVERY MORNING BEFORE BREAKFAST.   dimenhyDRINATE 50 MG tablet Commonly known as:  DRAMAMINE Take 50 mg every 8 (eight) hours as needed by mouth (for dizziness/vertigo).   feeding supplement Liqd Commonly known as:  BOOST / RESOURCE BREEZE Take 1 Container by mouth 2 (two) times daily.   ferrous sulfate 325 (65 FE) MG tablet Take 325 mg by mouth every Monday, Wednesday, and Friday. In the morning with breakfast.   fluticasone 50 MCG/ACT nasal spray Commonly known as:  FLONASE Place 1 spray 2 (two) times daily as needed into both nostrils (for allergies.).   fluticasone furoate-vilanterol 100-25 MCG/INH Aepb Commonly known as:  BREO ELLIPTA Inhale 1 puff into the lungs daily.   furosemide 40 MG tablet Commonly known as:  LASIX Take 1 tablet (40 mg total) by mouth 2 (two) times daily.   gabapentin 100 MG capsule Commonly known as:  NEURONTIN Take 1 capsule (100 mg total) by mouth daily.   ipratropium 0.03 % nasal spray Commonly known as:  ATROVENT Place 1 spray into both nostrils at bedtime.   isosorbide mononitrate 10 MG tablet Commonly known as:  ISMO,MONOKET Take 10 mg by mouth 2 (two) times daily.   loratadine 10 MG tablet Commonly known as:  CLARITIN Take 10 mg by mouth daily.   LUBRICANT EYE DROPS 0.4-0.3 % Soln Generic drug:  Polyethyl Glycol-Propyl Glycol Apply 1 drop to eye 4 (four) times daily.   omeprazole 40 MG  capsule Commonly known as:  PRILOSEC Take 40 mg by mouth daily.   ondansetron 8 MG tablet Commonly known as:  ZOFRAN Take 8 mg by mouth every 8 (eight) hours.   polyethylene glycol packet Commonly known as:  MIRALAX / GLYCOLAX Take 17 g by mouth daily.   Probiotic 250 MG Caps Take 1 capsule by mouth daily with lunch.   sodium chloride 0.65 % Soln nasal spray Commonly known as:  OCEAN Place 1 spray into both nostrils 4 (four) times daily as needed for congestion.   Vitamin D3 10000 units Tabs Take 10,000 Units 4 (four) times a week by mouth. Monday, Tuesday, Wednesday, &  Thursday       No orders of the defined types were placed in this encounter.   Immunization History  Administered Date(s) Administered  . Influenza Split 07/03/2011, 06/16/2012  . Influenza, High Dose Seasonal PF 03/28/2017  . Influenza, Seasonal, Injecte, Preservative Fre 08/12/2012  . Influenza-Unspecified 04/15/2014, 04/13/2015, 05/04/2016  . Pneumococcal Conjugate-13 10/17/2015  . Pneumococcal Polysaccharide-23 11/26/2016  . Zoster 08/12/2012    Social History   Tobacco Use  . Smoking status: Former Smoker    Types: Cigarettes  . Smokeless tobacco: Never Used  Substance Use Topics  . Alcohol use: No    Review of Systems  DATA OBTAINED: from patient,  family member- same as yesterday GENERAL:  no fevers, +fatigue, +appetite changes SKIN: No itching, rash HEENT: No complaint RESPIRATORY: No cough, wheezing, SOB CARDIAC: No chest pain, palpitations, lower extremity edema  GI: No abdominal pain, + nausea, no V/D or constipation, No heartburn or reflux  GU: No dysuria, frequency or urgency, or incontinence  MUSCULOSKELETAL: No unrelieved bone/joint pain NEUROLOGIC: No headache, dizziness  PSYCHIATRIC: No overt anxiety or sadness  Vitals:   11/04/17 1512  BP: (!) 100/52  Pulse: 73  Resp: 18  Temp: 97.7 F (36.5 C)  SpO2: 94%   Body mass index is 21.9 kg/m. Physical  Exam  GENERAL APPEARANCE: Alert, conversant, No acute distress  SKIN: No diaphoresis rash HEENT: Unremarkable RESPIRATORY: Breathing is even, unlabored. Lung sounds are clear   CARDIOVASCULAR: Heart RRR 4/6 systolic murmur, no rubs or gallops.  Eyes peripheral edema of ankles only GASTROINTESTINAL: Abdomen is soft, non-tender, not distended w/ normal bowel sounds.  GENITOURINARY: Bladder non tender, not distended  MUSCULOSKELETAL: No abnormal joints or musculature NEUROLOGIC: Cranial nerves 2-12 grossly intact. Moves all extremities PSYCHIATRIC: Mood and affect appropriate to situation, no behavioral issues  Patient Active Problem List   Diagnosis Date Noted  . Acute respiratory failure with hypoxia and hypercapnia ([place]) 10/25/2017  . Acute on chronic diastolic (congestive) heart failure ([place]) 10/25/2017  . Mitral regurgitation 10/25/2017  . Pulmonary hypertension, primary ([place]) 10/25/2017  . URI (upper respiratory infection) 10/25/2017  . Anemia, iron deficiency 10/25/2017  . Type 2 diabetes mellitus with diabetic neuropathy, unspecified ([place]) 10/25/2017  . Thrombocytopenia ([place]) 10/25/2017  . Acute respiratory failure ([place]) 10/17/2017  . Status post total replacement of right hip 07/11/2017  . Pain of right hip joint 05/05/2017  . DOE (dyspnea on exertion) 02/21/2017  . Asthma, chronic, moderate persistent, uncomplicated 99/83/3825  . Chronic respiratory failure with hypoxia and hypercapnia ([place]) 02/21/2017  . Hypertensive urgency 01/30/2017  . Dizziness 01/30/2017  . Hyponatremia with extracellular fluid depletion 01/30/2017  . Hyponatremia 01/30/2017  . Lumbar spondylosis 01/28/2017  . Unilateral primary osteoarthritis, right hip 01/28/2017  . Arthritis   . Asthma   . Cataract   . Colon cancer ([place])   . COPD (chronic obstructive pulmonary disease) ([place])   . Diabetes mellitus without complication ([place])   . Diverticulitis   . DVT (deep vein thrombosis) in pregnancy ([place])    . Essential hypertension   . GERD (gastroesophageal reflux disease)   . Hearing impaired   . Hyperlipidemia   . Hypertension   . TIA (transient ischemic attack)   . Seasonal allergies     CMP     Component Value Date/Time   NA 126 (A) 11/04/2017   NA 137 10/27/2017   K 3.7 11/04/2017   K 4.0 10/27/2017   CL 86 (L) 10/22/2017 0355   CO2 37 (  H) 10/22/2017 0355   GLUCOSE 103 (H) 10/22/2017 0355   BUN 9 11/04/2017   CREATININE 0.6 11/04/2017   CREATININE 0.86 10/27/2017   CALCIUM 9.3 10/27/2017   PROT 5.5 (L) 10/21/2017 0357   ALBUMIN 2.7 (L) 10/21/2017 0357   AST 24 10/21/2017 0357   ALT 46 10/21/2017 0357   ALKPHOS 75 10/21/2017 0357   BILITOT 0.9 10/21/2017 0357   GFRNONAA 58 (L) 10/22/2017 0355   GFRAA >60 10/22/2017 0355   CL   75  11/04/17   CO2   46  11/04/17 Recent Labs    10/17/17 2145 10/18/17 0246  10/20/17 0420 10/21/17 0357 10/22/17 0355 10/27/17 11/04/17  NA 131* 132*   < > 133* 135 135 137 126*  K 5.5* 5.8*   < > 4.8 4.2 3.6 4.0 3.7  CL 100* 100*   < > 94* 91* 86*  --   --   CO2 24 23   < > 30 34* 37*  --   --   GLUCOSE 179* 122*   < > 119* 107* 103*  --   --   BUN 16 18   < > 28* 24* 20 12 9   CREATININE 0.75 0.80   < > 0.86 0.83 0.84 0.86 0.6  CALCIUM 8.3* 8.5*   < > 8.8* 9.0 9.0 9.3  --   MG 2.1 2.0  --  1.8  --   --   --   --   PHOS 5.1* 4.1  --   --  4.5  --   --   --    < > = values in this interval not displayed.   Recent Labs    10/16/17 2150 10/21/17 0357  AST 45* 24  ALT 26 46  ALKPHOS 89 75  BILITOT 0.7 0.9  PROT 7.4 5.5*  ALBUMIN 4.0 2.7*   Recent Labs    05/09/17 1201  10/16/17 2150  10/19/17 0336 10/20/17 0420 10/21/17 0357 10/27/17  WBC 7.0   < > 18.7*   < > 10.8* 9.2 8.2 18.5  18.5  NEUTROABS 4.8  --  11.9*  --   --   --   --   --   HGB 13.1   < > 14.6   < > 10.2* 10.6* 10.4* 11.9*  11.9  HCT 39.3   < > 46.3*   < > 31.6* 34.4* 33.2* 35*  34.5  MCV 95.6   < > 92.0   < > 92.9 91.7 91.5  --   PLT 233.0   < > 248   <  > 139* 182 203 220   < > = values in this interval not displayed.   No results for input(s): CHOL, LDLCALC, TRIG in the last 8760 hours.  Invalid input(s): HCL No results found for: [facility] Lab Results  Component Value Date   TSH 1.79 02/23/2016   Lab Results  Component Value Date   HGBA1C 5.7 (H) 07/01/2017   Lab Results  Component Value Date   CHOL 204 (A) 02/23/2016   HDL 72 (A) 02/23/2016   LDLCALC 111 02/23/2016   TRIG 103 02/23/2016    Significant Diagnostic Results in last 30 days:  Dg Chest Port 1 View  Result Date: 10/19/2017 CLINICAL DATA:  CHF EXAM: PORTABLE CHEST 1 VIEW COMPARISON:  10/18/2017 FINDINGS: Cardiomegaly. Moderate bilateral pleural effusions. Diffuse bilateral airspace disease again noted, not significantly changed. IMPRESSION: Continued diffuse bilateral airspace disease which could reflect edema or infection. Stable moderate  effusions. Electronically Signed   By: Rolm Baptise M.D.   On: 10/19/2017 07:16   Dg Chest Port 1 View  Result Date: 10/18/2017 CLINICAL DATA:  Respiratory failure. EXAM: PORTABLE CHEST 1 VIEW COMPARISON:  10/16/2017 FINDINGS: Endotracheal and enteric tubes have been removed. The cardiac silhouette remains mildly enlarged. Aortic atherosclerosis is noted. Veiling opacities in the right greater than left lung bases obscure the hemidiaphragms and have increased from prior. There is pulmonary vascular congestion with diffuse interstitial and patchy airspace opacities in both lungs, similar to the prior study. No pneumothorax is identified. IMPRESSION: 1. Interval extubation. 2. Increasing bilateral pleural effusions. 3. Similar diffuse interstitial and patchy airspace opacities in both lungs which may reflect edema or pneumonia. Electronically Signed   By: Logan Bores M.D.   On: 10/18/2017 07:48   Dg Chest Port 1 View  Result Date: 10/16/2017 CLINICAL DATA:  Initial evaluation for intubation. EXAM: PORTABLE CHEST 1 VIEW COMPARISON:   Prior radiograph from 01/31/2017. FINDINGS: Endotracheal tube in place with tip well positioned 2.7 cm above the carina. Tip of an apparent additional enteric tube positioned above the thoracic inlet, which may be in the esophagus or airway. Stable cardiomegaly. Mediastinal silhouette normal. Aortic atherosclerosis. Failing opacities overlying the bilateral hemidiaphragms, consistent with effusions. Diffuse vascular congestion with interstitial prominence, consistent with moderate diffuse pulmonary edema. Superimposed bibasilar opacities favored to reflect atelectasis and/or edema. Superimposed infiltrates would be difficult to exclude. No pneumothorax. No acute osseous abnormality. IMPRESSION: 1. Endotracheal tube in place with tip well positioned 2.7 cm above the carina. 2. Tip of enteric tube positioned above the thoracic inlet. This may lie within the trachea or esophagus. Repositioning recommended. 3. Cardiomegaly with moderate diffuse pulmonary edema and bilateral pleural effusions, suggesting CHF. Superimposed bibasilar opacities favored to reflect atelectasis/edema. Infiltrates could be considered in the correct clinical setting. Electronically Signed   By: Jeannine Boga M.D.   On: 10/16/2017 22:47   Dg Abd Portable 1 View  Result Date: 10/17/2017 CLINICAL DATA:  OG tube placement. EXAM: PORTABLE ABDOMEN - 1 VIEW COMPARISON:  None. FINDINGS: Tip and side-port of the enteric tube below the diaphragm in the stomach. No bowel obstruction in the visualized upper abdomen. Bibasilar opacities and perihilar edema, as seen on recent chest radiograph. IMPRESSION: Tip and side port of the enteric tube in the stomach Electronically Signed   By: Jeb Levering M.D.   On: 10/17/2017 01:35    Assessment and Plan  Hyponatremia/hypochloremia/elevated bicarb- before spoke with patient's family I called Dr. Alvester Chou cardiology office and spoke to his nurse to let her know the abnormal lab values since patient  was going to be seeing him tomorrow.  I also told her I was decreasing patient's Lasix to 40 mg daily and that the chest x-ray we had ordered on her showed only residual bibasilar infiltrates with clear costophrenic angles, clearly no pulmonary effusion.  I had reviewed patient is chart and labs from prior and she had had low chloride and elevated CO2 while in the hospital just not quite this severe.  I then spoke with the family as above about the labs and about the chest x-ray findings and about the weight findings and the fact that I was decreasing her Lasix to 40 mg daily, but would defer to whatever cardiology recommended.  Family was appreciative that I had spoken to Dr. Naida Sleight nurse.  Time spent greater than 35 minutes Anne D. [name], MD

## 2017-11-05 ENCOUNTER — Telehealth: Payer: Self-pay | Admitting: Cardiovascular Disease

## 2017-11-05 ENCOUNTER — Encounter: Payer: Self-pay | Admitting: Cardiovascular Disease

## 2017-11-05 ENCOUNTER — Ambulatory Visit (INDEPENDENT_AMBULATORY_CARE_PROVIDER_SITE_OTHER): Payer: Medicare Other | Admitting: Cardiovascular Disease

## 2017-11-05 VITALS — BP 121/59 | HR 78 | Ht 64.0 in | Wt 127.0 lb

## 2017-11-05 DIAGNOSIS — I340 Nonrheumatic mitral (valve) insufficiency: Secondary | ICD-10-CM

## 2017-11-05 DIAGNOSIS — E871 Hypo-osmolality and hyponatremia: Secondary | ICD-10-CM

## 2017-11-05 DIAGNOSIS — I5033 Acute on chronic diastolic (congestive) heart failure: Secondary | ICD-10-CM | POA: Diagnosis not present

## 2017-11-05 NOTE — Assessment & Plan Note (Signed)
Small has heart failure probably related to severe MR and pulmonary hypertension. Her systolic function is well-preserved with ejection fraction of 60-65%. She was recently admitted with heart failure and was diuresed. Her pulmonary hypertension is probably secondary. She most likely will need mitral valve repair and/or mitral clip procedure. I am referring her to Dr. Haroldine Laws and Dr. Burt Knack for further evaluation. She will need a TEE to further characterize the nature of her MR.

## 2017-11-05 NOTE — Telephone Encounter (Signed)
Dr.Bensimhons RN [name] is working on getting patient scheduled. Dr.Bensimhons schedule is full so she is trying to coordinate this appointment with Dr.Bensimhon.

## 2017-11-05 NOTE — Assessment & Plan Note (Signed)
History of hyperlipidemia not on statin therapy followed by her PCP 

## 2017-11-05 NOTE — Patient Instructions (Signed)
Medication Instructions: Your physician recommends that you continue on your current medications as directed. Please refer to the Current Medication list given to you today.  Labwork: Your physician recommends that you return for lab work today: BMET, BNP, CBC   Follow-Up:  You have been referred to the CHF Clinic with Dr. Haroldine Laws for CHF---Urgent, THIS WEEK  You have been referred to Dr. Burt Knack at our [facility] office [phone]) to evaluate for Mitral Clip.  Your physician recommends that you schedule a follow-up appointment in: 6-8 weeks with Dr. Gwenlyn Found.

## 2017-11-05 NOTE — Telephone Encounter (Signed)
LVM on triage phone at [facility]. Waiting to hear back.

## 2017-11-05 NOTE — Progress Notes (Signed)
11/05/2017 [name]   May 05, 1921  [id number]  Primary Physician [name], NP Primary Cardiologist: Lorretta Harp MD Garret Reddish, [place], [place]  HPI:  [name] is a 84 y.o.  Thin and frail appearing widowed Caucasian female mother of 3 children, grandmother and 3 grandchildren referred to me by Dr. [name] for severe mitral regurgitation and diastolic heart failure. She has a history of hypertension, hyperlipidemia and diabetes all medically treated She's never had a heart attack but has had a TIA in the past. She did have a right total hip replacement performed by Dr. Ninfa Linden in November last year. She was admitted on 10/16/17 with respiratory failure and was intubated in the emergency room. She was briefly treated with Biaxin for pneumonia but ultimately was found she had diastolic heart failure. 2-D echo showed severe MR and pulmonary hypertension. She was diuresed and discharged home. She's currently is in a rehabilitation facility She apparently had progressive hyponatremia on twice a day Lasix. This has been cut back to daily. She does also complain of nausea. She is on constant O2 at this time. We have talked about her desire to pursue either mitral valve repair and/or mitral clip procedure for quality of life. He   Current Meds  Medication Sig  . acetaminophen (TYLENOL) 500 MG tablet Take 500 mg by mouth daily.  Marland Kitchen aspirin EC 81 MG tablet Take 81 mg by mouth 2 (two) times a week. on Sunday and Thursday only.  . bisoprolol (ZEBETA) 5 MG tablet Take 0.5 tablets (2.5 mg total) by mouth daily.  . Calcium Carbonate-Vit D-Min (CALCIUM 600+D PLUS MINERALS) 600-400 MG-UNIT CHEW Chew 2 tablets every morning by mouth.   . Cholecalciferol (VITAMIN D3) 10000 units TABS Take 10,000 Units 4 (four) times a week by mouth. Monday, Tuesday, Wednesday, & Thursday  . clopidogrel (PLAVIX) 75 MG tablet Take 1 tablet (75 mg total) by mouth daily.  Marland Kitchen dexlansoprazole (DEXILANT) 60 MG capsule TAKE  1 CAPSULE BY MOUTH EVERY MORNING BEFORE BREAKFAST.  Marland Kitchen dimenhyDRINATE (DRAMAMINE) 50 MG tablet Take 50 mg every 8 (eight) hours as needed by mouth (for dizziness/vertigo).   . feeding supplement (BOOST / RESOURCE BREEZE) LIQD Take 1 Container by mouth 2 (two) times daily.  . ferrous sulfate 325 (65 FE) MG tablet Take 325 mg by mouth every Monday, Wednesday, and Friday. In the morning with breakfast.  . fluticasone (FLONASE) 50 MCG/ACT nasal spray Place 1 spray 2 (two) times daily as needed into both nostrils (for allergies.).   Marland Kitchen fluticasone furoate-vilanterol (BREO ELLIPTA) 100-25 MCG/INH AEPB Inhale 1 puff into the lungs daily.  . furosemide (LASIX) 40 MG tablet Take 1 tablet (40 mg total) by mouth 2 (two) times daily. (Patient taking differently: Take 40 mg by mouth daily. )  . gabapentin (NEURONTIN) 100 MG capsule Take 1 capsule (100 mg total) by mouth daily.  . isosorbide mononitrate (ISMO,MONOKET) 10 MG tablet Take 10 mg by mouth 2 (two) times daily.  Marland Kitchen loratadine (CLARITIN) 10 MG tablet Take 10 mg by mouth daily.   Marland Kitchen omeprazole (PRILOSEC) 40 MG capsule Take 40 mg by mouth daily.  . ondansetron (ZOFRAN) 8 MG tablet Take 8 mg by mouth every 8 (eight) hours.  Vladimir Faster Glycol-Propyl Glycol (LUBRICANT EYE DROPS) 0.4-0.3 % SOLN Apply 1 drop to eye 4 (four) times daily.   . polyethylene glycol (MIRALAX / GLYCOLAX) packet Take 17 g by mouth daily.  . Saccharomyces boulardii (PROBIOTIC) 250 MG CAPS Take  1 capsule by mouth daily with lunch.  . sodium chloride (OCEAN) 0.65 % SOLN nasal spray Place 1 spray into both nostrils 4 (four) times daily as needed for congestion.  Marland Kitchen tiotropium (SPIRIVA) 18 MCG inhalation capsule Place 18 mcg into inhaler and inhale daily.     Allergies  Allergen Reactions  . Albuterol Anxiety    Tachycardia, Tachypnea  . Prochlorperazine Other (See Comments)    tremor  . Celecoxib Nausea And Vomiting and Other (See Comments)    Body aches  . Codeine Nausea And  Vomiting    Tolerates hydrocodone  . Fish Oil Nausea And Vomiting  . Fosamax [Alendronate Sodium] Nausea And Vomiting  . Morphine And Related Nausea And Vomiting  . Moxifloxacin Other (See Comments)    fatigue  . Oxycodone Nausea And Vomiting    Tolerates hydrocodone  . Pregabalin Swelling  . Propoxyphene Nausea And Vomiting  . Statins Other (See Comments)    Muscle pain  . Aspirin-Dipyridamole Er Other (See Comments)    Headcache  . Buprenorphine Hcl Nausea And Vomiting    Social History   Socioeconomic History  . Marital status: Widowed    Spouse name: Not on file  . Number of children: Not on file  . Years of education: Not on file  . Highest education level: Not on file  Occupational History  . Not on file  Social Needs  . Financial resource strain: Not on file  . Food insecurity:    Worry: Not on file    Inability: Not on file  . Transportation needs:    Medical: Not on file    Non-medical: Not on file  Tobacco Use  . Smoking status: Former Smoker    Types: Cigarettes  . Smokeless tobacco: Never Used  Substance and Sexual Activity  . Alcohol use: No  . Drug use: No  . Sexual activity: Not Currently  Lifestyle  . Physical activity:    Days per week: Not on file    Minutes per session: Not on file  . Stress: Not on file  Relationships  . Social connections:    Talks on phone: Not on file    Gets together: Not on file    Attends religious service: Not on file    Active member of club or organization: Not on file    Attends meetings of clubs or organizations: Not on file    Relationship status: Not on file  . Intimate partner violence:    Fear of current or ex partner: Not on file    Emotionally abused: Not on file    Physically abused: Not on file    Forced sexual activity: Not on file  Other Topics Concern  . Not on file  Social History Narrative   Patient lives with: alone   Support system: children and other family members   Type of residence:  apartment   Transportation means: family               Review of Systems: General: negative for chills, fever, night sweats or weight changes.  Cardiovascular: negative for chest pain, dyspnea on exertion, edema, orthopnea, palpitations, paroxysmal nocturnal dyspnea or shortness of breath Dermatological: negative for rash Respiratory: negative for cough or wheezing Urologic: negative for hematuria Abdominal: negative for nausea, vomiting, diarrhea, bright red blood per rectum, melena, or hematemesis Neurologic: negative for visual changes, syncope, or dizziness All other systems reviewed and are otherwise negative except as noted above.    Blood  pressure (!) 121/59, pulse 78, height 5\' 4"  (1.626 m), weight 127 lb (57.6 kg), SpO2 98 %.  General appearance: alert and no distress Neck: no adenopathy, no carotid bruit, no JVD, supple, symmetrical, trachea midline and thyroid not enlarged, symmetric, no tenderness/mass/nodules Lungs: clear to auscultation bilaterally Heart: soft apical systolic murmur with a click Extremities: extremities normal, atraumatic, no cyanosis or edema Pulses: 2+ and symmetric Skin: Skin color, texture, turgor normal. No rashes or lesions Neurologic: Alert and oriented X 3, normal strength and tone. Normal symmetric reflexes. Normal coordination and gait  EKG not performed today  ASSESSMENT AND PLAN:   Essential hypertension History of essential hypertension blood pressure measured at 121/59. She is on Zebeta. Continue current meds at current dosing.  Hyperlipidemia History of hyperlipidemia not on statin therapy followed by her PCP  Hyponatremia Recently recognized hyponatremia probably related to diuresis. If cut her Lasix back from twice a day to once a day.  Acute on chronic diastolic (congestive) heart failure (HCC) Small has heart failure probably related to severe MR and pulmonary hypertension. Her systolic function is well-preserved with  ejection fraction of 60-65%. She was recently admitted with heart failure and was diuresed. Her pulmonary hypertension is probably secondary. She most likely will need mitral valve repair and/or mitral clip procedure. I am referring her to Dr. Haroldine Laws and Dr. Burt Knack for further evaluation. She will need a TEE to further characterize the nature of her MR.      Lorretta Harp MD FACP,FACC,FAHA, [facility] 11/05/2017 11:14 AM

## 2017-11-05 NOTE — Telephone Encounter (Signed)
Patient was seen by Dr. Gwenlyn Found today and an "urgent" referral to HF clinic was placed. Appointment not yet scheduled. Message routed to primary [facility] and HF clinic triage pool for follow up on appointment scheduling/transportation noted below.

## 2017-11-05 NOTE — Assessment & Plan Note (Signed)
History of essential hypertension blood pressure measured at 121/59. She is on Zebeta. Continue current meds at current dosing.

## 2017-11-05 NOTE — Assessment & Plan Note (Signed)
Recently recognized hyponatremia probably related to diuresis. If cut her Lasix back from twice a day to once a day.

## 2017-11-05 NOTE — Telephone Encounter (Signed)
New Message  Pt's daughter was told that someone at Dr. Kennon Holter office will contact her when the appt with Dr. Missy Sabins is scheduled and states that [facility] will only provide transportation for her mother's appt, is if we contact the facility at [phone] when the appt is made for Dr. Missy Sabins

## 2017-11-06 NOTE — Telephone Encounter (Signed)
New message  Pt daughter verbalized that she is calling for RN  From appt on 11-05-2017 she said that she is waiting on pt lab results     And if it can be placed on MyChart and sent fax to [facility] [phone] Attention to the Director of Nursing  Dr.Alexander [phone] want to know if pt medication need to be adjusted?

## 2017-11-06 NOTE — Telephone Encounter (Signed)
Results released to MyChart: Dr. Gwenlyn Found has reviewed the lab results - the metabolic panel is OK and CBC is acceptable however the chloride level is low. He has recommended holding lasix for now. The BNP results are pending. We will notify you got those once they have come back.  Results faxed via Epic to # requested below

## 2017-11-07 ENCOUNTER — Telehealth: Payer: Self-pay | Admitting: Cardiovascular Disease

## 2017-11-07 ENCOUNTER — Telehealth (HOSPITAL_COMMUNITY): Payer: Self-pay | Admitting: Vascular Surgery

## 2017-11-07 LAB — BASIC METABOLIC PANEL
BUN/Creatinine Ratio: 15 (ref 12–28)
BUN: 10 mg/dL (ref 10–36)
CO2: 39 mmol/L — AB (ref 20–29)
CREATININE: 0.68 mg/dL (ref 0.57–1.00)
Calcium: 9.6 mg/dL (ref 8.7–10.3)
Chloride: 72 mmol/L — CL (ref 96–106)
GFR calc Af Amer: 87 mL/min/{1.73_m2} (ref >59)
GFR, EST NON AFRICAN AMERICAN: 75 mL/min/{1.73_m2} (ref >59)
Glucose: 135 mg/dL — ABNORMAL HIGH (ref 65–99)
Potassium: 4.5 mmol/L (ref 3.5–5.2)
SODIUM: 129 mmol/L — AB (ref 134–144)

## 2017-11-07 LAB — CBC
HEMATOCRIT: 32.5 % — AB (ref 34.0–46.6)
Hemoglobin: 10.9 g/dL — ABNORMAL LOW (ref 11.1–15.9)
MCH: 28.9 pg (ref 26.6–33.0)
MCHC: 33.5 g/dL (ref 31.5–35.7)
MCV: 86 fL (ref 79–97)
PLATELETS: 272 10*3/uL (ref 150–379)
RBC: 3.77 x10E6/uL (ref 3.77–5.28)
RDW: 14.8 % (ref 12.3–15.4)
WBC: 8.2 10*3/uL (ref 3.4–10.8)

## 2017-11-07 LAB — BRAIN NATRIURETIC PEPTIDE: BNP: 19.2 pg/mL (ref 0.0–100.0)

## 2017-11-07 NOTE — Telephone Encounter (Signed)
Why hasn't she received a call from the heart failure clinicyet

## 2017-11-07 NOTE — Telephone Encounter (Signed)
Returned call to Dr. [name] who states the family informed them that they should hold the lasix.    Dr. [name] needs clear instructions on what they need to.   She states she has decreased the lasix to once daily, needs to know if patient should stop completely until repeat labs are drawn or should continue with the 1x daily (instead of BID).     Advised I would route to MD and primary for clarification.

## 2017-11-07 NOTE — Telephone Encounter (Signed)
New message     Needs orders on medication - there is not direction for the patients Lasik - they reduced it to 1 a day,  But were told that Dr Gwenlyn Found wanted patient to stop the medication , but no DC were sent

## 2017-11-07 NOTE — Telephone Encounter (Signed)
Follow UP:    Dr Gwenlyn Found recommended that pt see Dr Missy Sabins asap.She is having a problem getting pt seen and Dr Gwenlyn Found wants pt seen right away.Daughter thought if you called Dr Bensihmon's office pt would get in sooner.

## 2017-11-07 NOTE — Telephone Encounter (Signed)
Advised daughter that message has been sent to the HF clinic requesting appointment. She also stated that MD at facility is wanting to speak with Dr Gwenlyn Found today to discuss patient. Looks as though phone message sent earlier. Will forward to Dr Gwenlyn Found so he will be aware patient still waiting on appointment

## 2017-11-07 NOTE — Telephone Encounter (Signed)
[place]ills made pt appt w/ scheduler/ called pt daughter gave pt daughter appt date and time also

## 2017-11-09 ENCOUNTER — Encounter: Payer: Self-pay | Admitting: Internal Medicine

## 2017-11-09 ENCOUNTER — Inpatient Hospital Stay (HOSPITAL_COMMUNITY): Payer: Medicare Other

## 2017-11-09 ENCOUNTER — Emergency Department (HOSPITAL_COMMUNITY): Payer: Medicare Other

## 2017-11-09 ENCOUNTER — Other Ambulatory Visit: Payer: Self-pay

## 2017-11-09 ENCOUNTER — Inpatient Hospital Stay (HOSPITAL_COMMUNITY)
Admission: EM | Admit: 2017-11-09 | Discharge: 2017-12-10 | DRG: 297 | Disposition: E | Payer: Medicare Other | Attending: Pulmonary Disease | Admitting: Pulmonary Disease

## 2017-11-09 DIAGNOSIS — Z86718 Personal history of other venous thrombosis and embolism: Secondary | ICD-10-CM

## 2017-11-09 DIAGNOSIS — I469 Cardiac arrest, cause unspecified: Principal | ICD-10-CM

## 2017-11-09 DIAGNOSIS — R570 Cardiogenic shock: Secondary | ICD-10-CM | POA: Diagnosis not present

## 2017-11-09 DIAGNOSIS — E876 Hypokalemia: Secondary | ICD-10-CM | POA: Diagnosis present

## 2017-11-09 DIAGNOSIS — K219 Gastro-esophageal reflux disease without esophagitis: Secondary | ICD-10-CM | POA: Diagnosis present

## 2017-11-09 DIAGNOSIS — Z881 Allergy status to other antibiotic agents status: Secondary | ICD-10-CM

## 2017-11-09 DIAGNOSIS — Z888 Allergy status to other drugs, medicaments and biological substances status: Secondary | ICD-10-CM

## 2017-11-09 DIAGNOSIS — J80 Acute respiratory distress syndrome: Secondary | ICD-10-CM | POA: Diagnosis not present

## 2017-11-09 DIAGNOSIS — Z7189 Other specified counseling: Secondary | ICD-10-CM

## 2017-11-09 DIAGNOSIS — I272 Pulmonary hypertension, unspecified: Secondary | ICD-10-CM | POA: Diagnosis present

## 2017-11-09 DIAGNOSIS — Z9049 Acquired absence of other specified parts of digestive tract: Secondary | ICD-10-CM | POA: Diagnosis not present

## 2017-11-09 DIAGNOSIS — Z7951 Long term (current) use of inhaled steroids: Secondary | ICD-10-CM

## 2017-11-09 DIAGNOSIS — Z87442 Personal history of urinary calculi: Secondary | ICD-10-CM | POA: Diagnosis not present

## 2017-11-09 DIAGNOSIS — I110 Hypertensive heart disease with heart failure: Secondary | ICD-10-CM | POA: Diagnosis present

## 2017-11-09 DIAGNOSIS — Z8042 Family history of malignant neoplasm of prostate: Secondary | ICD-10-CM

## 2017-11-09 DIAGNOSIS — J45909 Unspecified asthma, uncomplicated: Secondary | ICD-10-CM | POA: Diagnosis present

## 2017-11-09 DIAGNOSIS — Z66 Do not resuscitate: Secondary | ICD-10-CM | POA: Diagnosis not present

## 2017-11-09 DIAGNOSIS — E871 Hypo-osmolality and hyponatremia: Secondary | ICD-10-CM | POA: Diagnosis present

## 2017-11-09 DIAGNOSIS — Z886 Allergy status to analgesic agent status: Secondary | ICD-10-CM

## 2017-11-09 DIAGNOSIS — Z7902 Long term (current) use of antithrombotics/antiplatelets: Secondary | ICD-10-CM

## 2017-11-09 DIAGNOSIS — R402362 Coma scale, best motor response, obeys commands, at arrival to emergency department: Secondary | ICD-10-CM | POA: Diagnosis present

## 2017-11-09 DIAGNOSIS — I5031 Acute diastolic (congestive) heart failure: Secondary | ICD-10-CM | POA: Diagnosis not present

## 2017-11-09 DIAGNOSIS — J9690 Respiratory failure, unspecified, unspecified whether with hypoxia or hypercapnia: Secondary | ICD-10-CM

## 2017-11-09 DIAGNOSIS — Z515 Encounter for palliative care: Secondary | ICD-10-CM | POA: Diagnosis not present

## 2017-11-09 DIAGNOSIS — Z90710 Acquired absence of both cervix and uterus: Secondary | ICD-10-CM

## 2017-11-09 DIAGNOSIS — Z803 Family history of malignant neoplasm of breast: Secondary | ICD-10-CM

## 2017-11-09 DIAGNOSIS — K449 Diaphragmatic hernia without obstruction or gangrene: Secondary | ICD-10-CM | POA: Diagnosis present

## 2017-11-09 DIAGNOSIS — E1151 Type 2 diabetes mellitus with diabetic peripheral angiopathy without gangrene: Secondary | ICD-10-CM | POA: Diagnosis present

## 2017-11-09 DIAGNOSIS — R402142 Coma scale, eyes open, spontaneous, at arrival to emergency department: Secondary | ICD-10-CM | POA: Diagnosis present

## 2017-11-09 DIAGNOSIS — Z85038 Personal history of other malignant neoplasm of large intestine: Secondary | ICD-10-CM | POA: Diagnosis not present

## 2017-11-09 DIAGNOSIS — I340 Nonrheumatic mitral (valve) insufficiency: Secondary | ICD-10-CM | POA: Diagnosis present

## 2017-11-09 DIAGNOSIS — Z87891 Personal history of nicotine dependence: Secondary | ICD-10-CM

## 2017-11-09 DIAGNOSIS — M1990 Unspecified osteoarthritis, unspecified site: Secondary | ICD-10-CM | POA: Diagnosis present

## 2017-11-09 DIAGNOSIS — E1165 Type 2 diabetes mellitus with hyperglycemia: Secondary | ICD-10-CM | POA: Diagnosis present

## 2017-11-09 DIAGNOSIS — I5032 Chronic diastolic (congestive) heart failure: Secondary | ICD-10-CM | POA: Diagnosis present

## 2017-11-09 DIAGNOSIS — E1140 Type 2 diabetes mellitus with diabetic neuropathy, unspecified: Secondary | ICD-10-CM | POA: Diagnosis present

## 2017-11-09 DIAGNOSIS — J9611 Chronic respiratory failure with hypoxia: Secondary | ICD-10-CM | POA: Diagnosis present

## 2017-11-09 DIAGNOSIS — Z8249 Family history of ischemic heart disease and other diseases of the circulatory system: Secondary | ICD-10-CM

## 2017-11-09 DIAGNOSIS — Z7982 Long term (current) use of aspirin: Secondary | ICD-10-CM

## 2017-11-09 DIAGNOSIS — I4891 Unspecified atrial fibrillation: Secondary | ICD-10-CM | POA: Diagnosis not present

## 2017-11-09 DIAGNOSIS — Z825 Family history of asthma and other chronic lower respiratory diseases: Secondary | ICD-10-CM

## 2017-11-09 DIAGNOSIS — H9190 Unspecified hearing loss, unspecified ear: Secondary | ICD-10-CM | POA: Diagnosis present

## 2017-11-09 DIAGNOSIS — I509 Heart failure, unspecified: Secondary | ICD-10-CM

## 2017-11-09 DIAGNOSIS — E785 Hyperlipidemia, unspecified: Secondary | ICD-10-CM | POA: Diagnosis present

## 2017-11-09 DIAGNOSIS — Z96641 Presence of right artificial hip joint: Secondary | ICD-10-CM | POA: Diagnosis present

## 2017-11-09 DIAGNOSIS — R402252 Coma scale, best verbal response, oriented, at arrival to emergency department: Secondary | ICD-10-CM | POA: Diagnosis present

## 2017-11-09 DIAGNOSIS — Z8673 Personal history of transient ischemic attack (TIA), and cerebral infarction without residual deficits: Secondary | ICD-10-CM | POA: Diagnosis not present

## 2017-11-09 DIAGNOSIS — Z885 Allergy status to narcotic agent status: Secondary | ICD-10-CM

## 2017-11-09 LAB — I-STAT ARTERIAL BLOOD GAS, ED
ACID-BASE EXCESS: 12 mmol/L — AB (ref 0.0–2.0)
Bicarbonate: 37.2 mmol/L — ABNORMAL HIGH (ref 20.0–28.0)
O2 Saturation: 100 %
PCO2 ART: 48.4 mmHg — AB (ref 32.0–48.0)
PH ART: 7.491 — AB (ref 7.350–7.450)
PO2 ART: 213 mmHg — AB (ref 83.0–108.0)
Patient temperature: 36.3
TCO2: 39 mmol/L — AB (ref 22–32)

## 2017-11-09 LAB — CBC WITH DIFFERENTIAL/PLATELET
Basophils Absolute: 0 10*3/uL (ref 0.0–0.1)
Basophils Relative: 0 %
EOS PCT: 0 %
Eosinophils Absolute: 0 10*3/uL (ref 0.0–0.7)
HEMATOCRIT: 29.4 % — AB (ref 36.0–46.0)
Hemoglobin: 9.4 g/dL — ABNORMAL LOW (ref 12.0–15.0)
LYMPHS ABS: 1 10*3/uL (ref 0.7–4.0)
LYMPHS PCT: 6 %
MCH: 28.7 pg (ref 26.0–34.0)
MCHC: 32 g/dL (ref 30.0–36.0)
MCV: 89.9 fL (ref 78.0–100.0)
MONO ABS: 0.7 10*3/uL (ref 0.1–1.0)
MONOS PCT: 4 %
Neutro Abs: 15.1 10*3/uL — ABNORMAL HIGH (ref 1.7–7.7)
Neutrophils Relative %: 90 %
PLATELETS: 247 10*3/uL (ref 150–400)
RBC: 3.27 MIL/uL — ABNORMAL LOW (ref 3.87–5.11)
RDW: 14.4 % (ref 11.5–15.5)
WBC: 16.8 10*3/uL — ABNORMAL HIGH (ref 4.0–10.5)

## 2017-11-09 LAB — PROCALCITONIN: PROCALCITONIN: 0.61 ng/mL

## 2017-11-09 LAB — I-STAT CHEM 8, ED
BUN: 7 mg/dL (ref 6–20)
CALCIUM ION: 0.97 mmol/L — AB (ref 1.15–1.40)
CHLORIDE: 73 mmol/L — AB (ref 101–111)
CREATININE: 0.9 mg/dL (ref 0.44–1.00)
Glucose, Bld: 279 mg/dL — ABNORMAL HIGH (ref 65–99)
HCT: 30 % — ABNORMAL LOW (ref 36.0–46.0)
Hemoglobin: 10.2 g/dL — ABNORMAL LOW (ref 12.0–15.0)
Potassium: 3.4 mmol/L — ABNORMAL LOW (ref 3.5–5.1)
Sodium: 122 mmol/L — ABNORMAL LOW (ref 135–145)
TCO2: 34 mmol/L — AB (ref 22–32)

## 2017-11-09 LAB — GLUCOSE, CAPILLARY
GLUCOSE-CAPILLARY: 110 mg/dL — AB (ref 65–99)
GLUCOSE-CAPILLARY: 78 mg/dL (ref 65–99)
Glucose-Capillary: 197 mg/dL — ABNORMAL HIGH (ref 65–99)

## 2017-11-09 LAB — I-STAT TROPONIN, ED: Troponin i, poc: 0.05 ng/mL (ref 0.00–0.08)

## 2017-11-09 LAB — BASIC METABOLIC PANEL
Anion gap: 15 (ref 5–15)
BUN: 10 mg/dL (ref 6–20)
CALCIUM: 8 mg/dL — AB (ref 8.9–10.3)
CO2: 31 mmol/L (ref 22–32)
CREATININE: 0.88 mg/dL (ref 0.44–1.00)
Chloride: 76 mmol/L — ABNORMAL LOW (ref 101–111)
GFR calc non Af Amer: 55 mL/min — ABNORMAL LOW (ref >60)
GLUCOSE: 272 mg/dL — AB (ref 65–99)
Potassium: 3 mmol/L — ABNORMAL LOW (ref 3.5–5.1)
Sodium: 122 mmol/L — ABNORMAL LOW (ref 135–145)

## 2017-11-09 LAB — CBC
HCT: 29.2 % — ABNORMAL LOW (ref 36.0–46.0)
HEMOGLOBIN: 9.5 g/dL — AB (ref 12.0–15.0)
MCH: 28.6 pg (ref 26.0–34.0)
MCHC: 32.5 g/dL (ref 30.0–36.0)
MCV: 88 fL (ref 78.0–100.0)
Platelets: 246 10*3/uL (ref 150–400)
RBC: 3.32 MIL/uL — ABNORMAL LOW (ref 3.87–5.11)
RDW: 14.5 % (ref 11.5–15.5)
WBC: 28.2 10*3/uL — ABNORMAL HIGH (ref 4.0–10.5)

## 2017-11-09 LAB — PROTIME-INR
INR: 1.1
Prothrombin Time: 14.1 seconds (ref 11.4–15.2)

## 2017-11-09 LAB — TSH: TSH: 2.517 u[IU]/mL (ref 0.350–4.500)

## 2017-11-09 LAB — OSMOLALITY: Osmolality: 268 mOsm/kg — ABNORMAL LOW (ref 275–295)

## 2017-11-09 LAB — INFLUENZA PANEL BY PCR (TYPE A & B)
Influenza A By PCR: NEGATIVE
Influenza B By PCR: NEGATIVE

## 2017-11-09 LAB — LACTIC ACID, PLASMA: Lactic Acid, Venous: 5.4 mmol/L (ref 0.5–1.9)

## 2017-11-09 LAB — TROPONIN I
Troponin I: 0.38 ng/mL (ref <0.03)
Troponin I: 0.72 ng/mL (ref <0.03)

## 2017-11-09 LAB — APTT: aPTT: 27 seconds (ref 24–36)

## 2017-11-09 MED ORDER — SODIUM CHLORIDE 0.9 % IV SOLN: INTRAVENOUS | Status: DC | PRN

## 2017-11-09 MED ORDER — FENTANYL CITRATE (PF) 100 MCG/2ML IJ SOLN
50.0000 ug | INTRAMUSCULAR | Status: DC | PRN
  Administered 2017-11-09: 50 ug via INTRAVENOUS

## 2017-11-09 MED ORDER — LEVOFLOXACIN IN D5W 500 MG/100ML IV SOLN
500.0000 mg | INTRAVENOUS | Status: DC
  Administered 2017-11-09: 500 mg via INTRAVENOUS
  Filled 2017-11-09: qty 100

## 2017-11-09 MED ORDER — NOREPINEPHRINE BITARTRATE 1 MG/ML IV SOLN
0.0000 ug/min | INTRAVENOUS | Status: DC
  Administered 2017-11-09: 8 ug/min via INTRAVENOUS
  Filled 2017-11-09: qty 16

## 2017-11-09 MED ORDER — LEVOFLOXACIN IN D5W 500 MG/100ML IV SOLN: 500.0000 mg | INTRAVENOUS | Status: DC

## 2017-11-09 MED ORDER — ORAL CARE MOUTH RINSE
15.0000 mL | OROMUCOSAL | Status: DC
  Administered 2017-11-09 – 2017-11-10 (×8): 15 mL via OROMUCOSAL

## 2017-11-09 MED ORDER — PANTOPRAZOLE SODIUM 40 MG IV SOLR
40.0000 mg | Freq: Every day | INTRAVENOUS | Status: DC
  Administered 2017-11-09 – 2017-11-10 (×2): 40 mg via INTRAVENOUS
  Filled 2017-11-09 (×2): qty 40

## 2017-11-09 MED ORDER — SODIUM CHLORIDE 0.9% FLUSH: 10.0000 mL | INTRAVENOUS | Status: DC | PRN

## 2017-11-09 MED ORDER — POTASSIUM CHLORIDE 10 MEQ/50ML IV SOLN
10.0000 meq | INTRAVENOUS | Status: AC
  Administered 2017-11-09 (×4): 10 meq via INTRAVENOUS
  Filled 2017-11-09 (×4): qty 50

## 2017-11-09 MED ORDER — CHLORHEXIDINE GLUCONATE 0.12% ORAL RINSE (MEDLINE KIT)
15.0000 mL | Freq: Two times a day (BID) | OROMUCOSAL | Status: DC
  Administered 2017-11-09 – 2017-11-11 (×5): 15 mL via OROMUCOSAL

## 2017-11-09 MED ORDER — EPINEPHRINE PF 1 MG/ML IJ SOLN
0.5000 ug/min | INTRAMUSCULAR | Status: DC
  Administered 2017-11-09: 8 ug/min via INTRAVENOUS
  Filled 2017-11-09: qty 4

## 2017-11-09 MED ORDER — SODIUM CHLORIDE 0.9 % IV SOLN: Freq: Once | INTRAVENOUS | Status: DC

## 2017-11-09 MED ORDER — LORAZEPAM 2 MG/ML IJ SOLN
INTRAMUSCULAR | Status: AC
  Filled 2017-11-09: qty 1

## 2017-11-09 MED ORDER — SODIUM CHLORIDE 0.9 % IV SOLN: INTRAVENOUS | Status: DC

## 2017-11-09 MED ORDER — CHLORHEXIDINE GLUCONATE CLOTH 2 % EX PADS
6.0000 | MEDICATED_PAD | Freq: Every day | CUTANEOUS | Status: DC
  Administered 2017-11-09 – 2017-11-11 (×3): 6 via TOPICAL

## 2017-11-09 MED ORDER — POTASSIUM CHLORIDE 10 MEQ/100ML IV SOLN: 10.0000 meq | Freq: Once | INTRAVENOUS | Status: DC

## 2017-11-09 MED ORDER — SODIUM CHLORIDE 0.9 % IV BOLUS
250.0000 mL | Freq: Once | INTRAVENOUS | Status: AC
  Administered 2017-11-09: 250 mL via INTRAVENOUS

## 2017-11-09 MED ORDER — INSULIN ASPART 100 UNIT/ML ~~LOC~~ SOLN
0.0000 [IU] | SUBCUTANEOUS | Status: DC
  Administered 2017-11-09: 2 [IU] via SUBCUTANEOUS
  Administered 2017-11-10 – 2017-11-11 (×4): 1 [IU] via SUBCUTANEOUS
  Administered 2017-11-11: 2 [IU] via SUBCUTANEOUS

## 2017-11-09 MED ORDER — SODIUM CHLORIDE 0.9% FLUSH
10.0000 mL | Freq: Two times a day (BID) | INTRAVENOUS | Status: DC
  Administered 2017-11-09 – 2017-11-11 (×5): 10 mL

## 2017-11-09 MED ORDER — NOREPINEPHRINE BITARTRATE 1 MG/ML IV SOLN
0.0000 ug/min | INTRAVENOUS | Status: DC
  Administered 2017-11-09: 5 ug/min via INTRAVENOUS
  Filled 2017-11-09 (×2): qty 4

## 2017-11-09 MED ORDER — POTASSIUM CHLORIDE IN NACL 20-0.9 MEQ/L-% IV SOLN
INTRAVENOUS | Status: DC
  Administered 2017-11-09 – 2017-11-10 (×2): via INTRAVENOUS
  Administered 2017-11-11: 60 mL via INTRAVENOUS
  Filled 2017-11-09 (×4): qty 1000

## 2017-11-09 MED ORDER — HEPARIN SODIUM (PORCINE) 5000 UNIT/ML IJ SOLN
5000.0000 [IU] | Freq: Three times a day (TID) | INTRAMUSCULAR | Status: DC
  Administered 2017-11-09 – 2017-11-11 (×6): 5000 [IU] via SUBCUTANEOUS
  Filled 2017-11-09 (×6): qty 1

## 2017-11-09 MED ORDER — LORAZEPAM 2 MG/ML IJ SOLN
0.5000 mg | Freq: Once | INTRAMUSCULAR | Status: AC
  Administered 2017-11-09: 0.5 mg via INTRAVENOUS

## 2017-11-09 MED ORDER — FENTANYL CITRATE (PF) 100 MCG/2ML IJ SOLN
INTRAMUSCULAR | Status: AC
  Filled 2017-11-09: qty 2

## 2017-11-09 MED ORDER — FENTANYL 2500MCG IN NS 250ML (10MCG/ML) PREMIX INFUSION
0.0000 ug/h | INTRAVENOUS | Status: DC
  Administered 2017-11-09: 25 ug/h via INTRAVENOUS
  Filled 2017-11-09 (×2): qty 250

## 2017-11-09 MED ORDER — FENTANYL CITRATE (PF) 100 MCG/2ML IJ SOLN
INTRAMUSCULAR | Status: AC
Start: 2017-11-09 — End: 2017-11-09
  Filled 2017-11-09: qty 2

## 2017-11-09 NOTE — Progress Notes (Signed)
Chaplain responded to page for family who has arrived for Trauma B, post CPR.  Patient resting well on vent.  Son, daughter and their spouses present at bedside letting patient know they are present and how much they love her.  Chaplain will continue to support family and staff as needed.    11/03/2017 1022  Clinical Encounter Type  Visited With Patient;Family;Patient and family together;Health care provider  Visit Type Initial;Spiritual support;ED;Trauma  Spiritual Encounters  Spiritual Needs Prayer;Emotional;Grief support

## 2017-11-09 NOTE — Progress Notes (Addendum)
Lactic Acid: 5.4.& K: 3.0. Dr. Pearline Cables notified & new orders received.  [name], [name]

## 2017-11-09 NOTE — Progress Notes (Signed)
Followed up with this family who are in the waiting room an also in the room with the patient.  Several family members visiting.  Family appreciative of support and are sharing stories with one another regarding their loved one.    10/12/2017 1622  Clinical Encounter Type  Visited With Patient and family together  Visit Type Follow-up;Spiritual support

## 2017-11-09 NOTE — Progress Notes (Signed)
RT note: RT transported patient to CT than to 2H17. vItal Signs stable at this time.

## 2017-11-09 NOTE — ED Provider Notes (Signed)
[facility] EMERGENCY DEPARTMENT Provider Note   CSN: [id number] Arrival date & time: 10/14/2017  0920     History   Chief Complaint Chief Complaint  Patient presents with  . post arrest    HPI [name] is a 84 y.o. female.  HPI Level 5 caveat acuity of situation patient unresponsive.  Patient is post cardiac arrest.  History is obtained from EMS.  Patient was found unresponsive at skilled nursing facility 7:30 AM today.  EMS arrived at 8:03 AM to find patient unresponsive rhythm asystole.  CPR was started.  Airway initially established with [facility] airway and subsequently switched to endotracheal tube and orally intubated.  She received epinephrine 2 mg intravenously and was started on intravenous epinephrine drip.  She experienced return of spontaneous circulation after epinephrine administered. Past Medical History:  Diagnosis Date  . Anemia    iron deficiency  . Arthritis   . Asthma   . Cataract   . Chronic respiratory failure with hypoxia ([place])   . Colitis   . Colon cancer ([place])    basal cell nose  . Diabetes mellitus without complication ([place])    NO meds ever [place] related to steroid use. Has more problems eith hypoglycemia  . Diverticulitis   . DVT (deep venous thrombosis) ([place])   . Essential hypertension   . Family history of adverse reaction to anesthesia    both daughters have problems getting bp up after anesthesia  . GERD (gastroesophageal reflux disease)   . Hearing impaired   . Hiatal hernia    neuropathy  . History of kidney stones   . Hyperlipidemia   . Hypertension   . Pneumonia   . PONV (postoperative nausea and vomiting)    BP drops really low cant breathe well vomiting  . Seasonal allergies   . TIA (transient ischemic attack)    x2 2 years ago    Patient Active Problem List   Diagnosis Date Noted  . Acute respiratory failure with hypoxia and hypercapnia ([place]) 10/25/2017  . Acute on chronic diastolic (congestive) heart  failure ([place]) 10/25/2017  . Mitral regurgitation 10/25/2017  . Pulmonary hypertension, primary ([place]) 10/25/2017  . URI (upper respiratory infection) 10/25/2017  . Anemia, iron deficiency 10/25/2017  . Type 2 diabetes mellitus with diabetic neuropathy, unspecified ([place]) 10/25/2017  . Thrombocytopenia ([place]) 10/25/2017  . Acute respiratory failure ([place]) 10/17/2017  . Status post total replacement of right hip 07/11/2017  . Pain of right hip joint 05/05/2017  . DOE (dyspnea on exertion) 02/21/2017  . Asthma, chronic, moderate persistent, uncomplicated 29/51/8841  . Chronic respiratory failure with hypoxia and hypercapnia ([place]) 02/21/2017  . Hypertensive urgency 01/30/2017  . Dizziness 01/30/2017  . Hyponatremia with extracellular fluid depletion 01/30/2017  . Hyponatremia 01/30/2017  . Lumbar spondylosis 01/28/2017  . Unilateral primary osteoarthritis, right hip 01/28/2017  . Arthritis   . Asthma   . Cataract   . Colon cancer ([place])   . COPD (chronic obstructive pulmonary disease) ([place])   . Diabetes mellitus without complication ([place])   . Diverticulitis   . DVT (deep vein thrombosis) in pregnancy ([place])   . Essential hypertension   . GERD (gastroesophageal reflux disease)   . Hearing impaired   . Hyperlipidemia   . Hypertension   . TIA (transient ischemic attack)   . Seasonal allergies     Past Surgical History:  Procedure Laterality Date  . APPENDECTOMY    . CHOLECYSTECTOMY    . COLON RESECTION  1967  .  EYE SURGERY     cataract  . JOINT REPLACEMENT     Right hip Dr. Ninfa Linden 07/11/17  . KIDNEY SURGERY  1966   cyst  . TONSILLECTOMY    . TOTAL ABDOMINAL HYSTERECTOMY    . TOTAL HIP ARTHROPLASTY Right 07/11/2017   Procedure: RIGHT TOTAL HIP ARTHROPLASTY ANTERIOR APPROACH;  Surgeon: Mcarthur Rossetti, MD;  Location: [facility] ORS;  Service: Orthopedics;  Laterality: Right;     OB History   None      Home Medications    Prior to Admission medications   Medication Sig  Start Date End Date Taking? Authorizing Provider  acetaminophen (TYLENOL) 500 MG tablet Take 500 mg by mouth daily.    [provider]  aspirin EC 81 MG tablet Take 81 mg by mouth 2 (two) times a week. on Sunday and Thursday only. 09/01/12   [provider]  bisoprolol (ZEBETA) 5 MG tablet Take 0.5 tablets (2.5 mg total) by mouth daily. 10/23/17   Velvet Bathe, MD  Calcium Carbonate-Vit D-Min (CALCIUM 600+D PLUS MINERALS) 600-400 MG-UNIT CHEW Chew 2 tablets every morning by mouth.     [provider]  Cholecalciferol (VITAMIN D3) 10000 units TABS Take 10,000 Units 4 (four) times a week by mouth. Monday, Tuesday, Wednesday, & Thursday    [provider]  clopidogrel (PLAVIX) 75 MG tablet Take 1 tablet (75 mg total) by mouth daily. 01/28/17   Nafziger, Tommi Rumps, NP  dexlansoprazole ([place]) 60 MG capsule TAKE 1 CAPSULE BY MOUTH EVERY MORNING BEFORE BREAKFAST. 08/29/17   Nafziger, Tommi Rumps, NP  dimenhyDRINATE (DRAMAMINE) 50 MG tablet Take 50 mg every 8 (eight) hours as needed by mouth (for dizziness/vertigo).     [provider]  feeding supplement (BOOST / RESOURCE BREEZE) LIQD Take 1 Container by mouth 2 (two) times daily.    [provider]  ferrous sulfate 325 (65 FE) MG tablet Take 325 mg by mouth every Monday, Wednesday, and Friday. In the morning with breakfast. 10/12/14   [provider]  fluticasone (FLONASE) 50 MCG/ACT nasal spray Place 1 spray 2 (two) times daily as needed into both nostrils (for allergies.).  11/01/14   [provider]  fluticasone furoate-vilanterol (BREO ELLIPTA) 100-25 MCG/INH AEPB Inhale 1 puff into the lungs daily. 02/21/17   Tanda Rockers, MD  furosemide (LASIX) 40 MG tablet Take 1 tablet (40 mg total) by mouth 2 (two) times daily. Patient taking differently: Take 40 mg by mouth daily.  10/22/17   Velvet Bathe, MD  gabapentin (NEURONTIN) 100 MG capsule Take 1 capsule (100 mg total) by mouth daily. 09/11/17    Laurey Morale, MD  isosorbide mononitrate (ISMO,MONOKET) 10 MG tablet Take 10 mg by mouth 2 (two) times daily.    [provider]  loratadine (CLARITIN) 10 MG tablet Take 10 mg by mouth daily.  09/01/12   [provider]  omeprazole (PRILOSEC) 40 MG capsule Take 40 mg by mouth daily.    [provider]  ondansetron (ZOFRAN) 8 MG tablet Take 8 mg by mouth every 8 (eight) hours.    [provider]  Polyethyl Glycol-Propyl Glycol (LUBRICANT EYE DROPS) 0.4-0.3 % SOLN Apply 1 drop to eye 4 (four) times daily.     [provider]  polyethylene glycol (MIRALAX / GLYCOLAX) packet Take 17 g by mouth daily.    [provider]  Saccharomyces boulardii (PROBIOTIC) 250 MG CAPS Take 1 capsule by mouth daily with lunch.    [provider]  sodium chloride (OCEAN) 0.65 % SOLN nasal spray Place 1 spray into both nostrils 4 (four) times daily as needed for congestion.    [provider]  tiotropium (SPIRIVA) 18 MCG inhalation capsule Place 18 mcg into inhaler and inhale daily.    [provider]    Family History Family History  Problem Relation Age of Onset  . Breast cancer Mother   . Heart attack Mother   . Emphysema Sister        smoked  . Prostate cancer Brother   . Heart disease Brother   . Atrial fibrillation Brother     Social History Social History   Tobacco Use  . Smoking status: Former Smoker    Types: Cigarettes  . Smokeless tobacco: Never Used  Substance Use Topics  . Alcohol use: No  . Drug use: No     Allergies   Albuterol; Prochlorperazine; Celecoxib; Codeine; Fish oil; Fosamax [alendronate sodium]; Morphine and related; Moxifloxacin; Oxycodone; Pregabalin; Propoxyphene; Statins; Aspirin-dipyridamole er; and Buprenorphine hcl   Review of Systems Review of Systems  Unable to perform ROS: Patient unresponsive     Physical Exam Updated Vital Signs BP (!) 110/52   Pulse (!) 113   Temp (!) 97.5  F (36.4 C) (Temporal)   Resp 19   SpO2 97%   Physical Exam  Constitutional:  Frail appearing.  Unresponsive Glasgow Coma Score 3  HENT:  Head: Normocephalic and atraumatic.  Orally intubated  Eyes:  Diffuse pinpoint bilaterally  Cardiovascular: Normal rate and regular rhythm.  Pulmonary/Chest:  Some spontaneous agonal respirations and respirations assisted by bag valve mask  Abdominal: She exhibits no distension.  Neurological:  [place] coma score 3.  No spontaneous movement of extremities.  Skin: Skin is warm and dry.  Nursing note and vitals reviewed.    ED Treatments / Results  Labs (all labs ordered are listed, but only abnormal results are displayed) Labs Reviewed  I-STAT CHEM 8, ED - Abnormal; Notable for the following components:      Result Value   Sodium 122 (*)    Potassium 3.4 (*)    Chloride 73 (*)    Glucose, Bld 279 (*)    Calcium, Ion 0.97 (*)    TCO2 34 (*)    Hemoglobin 10.2 (*)    HCT 30.0 (*)    All other components within normal limits  CBC WITH DIFFERENTIAL/PLATELET  I-STAT TROPONIN, ED    EKG Interpretation  Date/Time:  Sunday November 09 2017 09:24:39 EDT Ventricular Rate:  110 PR Interval:    QRS Duration: 91 QT Interval:  342 QTC Calculation: 463 R Axis:   85 Text Interpretation:  Sinus tachycardia Paired ventricular premature complexes Borderline right axis deviation Borderline ST depression, diffuse leads Baseline wander in lead(s) V6 Premature ventricular complexes New since previous tracing Confirmed by Orlie Dakin [phone]) on 10/28/2017 10:05:43 AM      Results for orders placed or performed during the hospital encounter of 10/17/2017  CBC with Differential  Result Value Ref Range   WBC 16.8 (H) 4.0 - 10.5 K/uL   RBC 3.27 (L) 3.87 - 5.11 MIL/uL   Hemoglobin 9.4 (L) 12.0 - 15.0 g/dL   HCT 29.4 (L) 36.0 - 46.0 %   MCV 89.9 78.0 - 100.0 fL   MCH 28.7 26.0 - 34.0 pg   MCHC 32.0 30.0 - 36.0 g/dL   RDW 14.4 11.5 - 15.5 %   Platelets  247 150 - 400 K/uL   Neutrophils Relative %  90 %   Neutro Abs 15.1 (H) 1.7 - 7.7 K/uL   Lymphocytes Relative 6 %   Lymphs Abs 1.0 0.7 - 4.0 K/uL   Monocytes Relative 4 %   Monocytes Absolute 0.7 0.1 - 1.0 K/uL   Eosinophils Relative 0 %   Eosinophils Absolute 0.0 0.0 - 0.7 K/uL   Basophils Relative 0 %   Basophils Absolute 0.0 0.0 - 0.1 K/uL  I-Stat Troponin, ED (not at [facility])  Result Value Ref Range   Troponin i, poc 0.05 0.00 - 0.08 ng/mL   Comment 3          I-Stat Chem 8, ED  Result Value Ref Range   Sodium 122 (L) 135 - 145 mmol/L   Potassium 3.4 (L) 3.5 - 5.1 mmol/L   Chloride 73 (L) 101 - 111 mmol/L   BUN 7 6 - 20 mg/dL   Creatinine, Ser 0.90 0.44 - 1.00 mg/dL   Glucose, Bld 279 (H) 65 - 99 mg/dL   Calcium, Ion 0.97 (L) 1.15 - 1.40 mmol/L   TCO2 34 (H) 22 - 32 mmol/L   Hemoglobin 10.2 (L) 12.0 - 15.0 g/dL   HCT 30.0 (L) 36.0 - 46.0 %  I-Stat arterial blood gas, ED  Result Value Ref Range   pH, Arterial 7.491 (H) 7.350 - 7.450   pCO2 arterial 48.4 (H) 32.0 - 48.0 mmHg   pO2, Arterial 213.0 (H) 83.0 - 108.0 mmHg   Bicarbonate 37.2 (H) 20.0 - 28.0 mmol/L   TCO2 39 (H) 22 - 32 mmol/L   O2 Saturation 100.0 %   Acid-Base Excess 12.0 (H) 0.0 - 2.0 mmol/L   Patient temperature 36.3 C    Collection site RADIAL, ALLEN'S TEST ACCEPTABLE    Drawn by VP    Sample type ARTERIAL    Dg Chest Portable 1 View  Result Date: 11/02/2017 CLINICAL DATA:  Post CPR.  Intubation. EXAM: PORTABLE CHEST 1 VIEW COMPARISON:  10/19/2017 FINDINGS: Endotracheal tube terminates 3.3 cm above carina.External pacer/defibrillator. Hyperinflation. Midline trachea. Cardiomegaly accentuated by AP portable technique. Atherosclerosis in the transverse aorta. Probable layering small right pleural effusion. No pneumothorax. Interstitial edema is mild, improved since the prior. Worse on the right. Improved bibasilar airspace disease. IMPRESSION: Endotracheal tube 3.3 cm above carina. Improved interstitial edema  with probable small right pleural effusion and bibasilar airspace disease/atelectasis. Electronically Signed   By: Abigail Miyamoto M.D.   On: 11/06/2017 10:08   Dg Chest Port 1 View  Result Date: 10/19/2017 CLINICAL DATA:  CHF EXAM: PORTABLE CHEST 1 VIEW COMPARISON:  10/18/2017 FINDINGS: Cardiomegaly. Moderate bilateral pleural effusions. Diffuse bilateral airspace disease again noted, not significantly changed. IMPRESSION: Continued diffuse bilateral airspace disease which could reflect edema or infection. Stable moderate effusions. Electronically Signed   By: Rolm Baptise M.D.   On: 10/19/2017 07:16   Dg Chest Port 1 View  Result Date: 10/18/2017 CLINICAL DATA:  Respiratory failure. EXAM: PORTABLE CHEST 1 VIEW COMPARISON:  10/16/2017 FINDINGS: Endotracheal and enteric tubes have been removed. The cardiac silhouette remains mildly enlarged. Aortic atherosclerosis is noted. Veiling opacities in the right greater than left lung bases obscure the hemidiaphragms and have increased from prior. There is pulmonary vascular congestion with diffuse interstitial and patchy airspace opacities in both lungs, similar to the prior study. No pneumothorax is identified. IMPRESSION: 1. Interval extubation. 2. Increasing bilateral pleural effusions. 3. Similar diffuse interstitial and patchy airspace opacities in both lungs which may reflect edema or pneumonia. Electronically Signed   By: Zenia Resides  Jeralyn Ruths M.D.   On: 10/18/2017 07:48   Dg Chest Port 1 View  Result Date: 10/16/2017 CLINICAL DATA:  Initial evaluation for intubation. EXAM: PORTABLE CHEST 1 VIEW COMPARISON:  Prior radiograph from 01/31/2017. FINDINGS: Endotracheal tube in place with tip well positioned 2.7 cm above the carina. Tip of an apparent additional enteric tube positioned above the thoracic inlet, which may be in the esophagus or airway. Stable cardiomegaly. Mediastinal silhouette normal. Aortic atherosclerosis. Failing opacities overlying the bilateral  hemidiaphragms, consistent with effusions. Diffuse vascular congestion with interstitial prominence, consistent with moderate diffuse pulmonary edema. Superimposed bibasilar opacities favored to reflect atelectasis and/or edema. Superimposed infiltrates would be difficult to exclude. No pneumothorax. No acute osseous abnormality. IMPRESSION: 1. Endotracheal tube in place with tip well positioned 2.7 cm above the carina. 2. Tip of enteric tube positioned above the thoracic inlet. This may lie within the trachea or esophagus. Repositioning recommended. 3. Cardiomegaly with moderate diffuse pulmonary edema and bilateral pleural effusions, suggesting CHF. Superimposed bibasilar opacities favored to reflect atelectasis/edema. Infiltrates could be considered in the correct clinical setting. Electronically Signed   By: Jeannine Boga M.D.   On: 10/16/2017 22:47   Dg Abd Portable 1 View  Result Date: 10/17/2017 CLINICAL DATA:  OG tube placement. EXAM: PORTABLE ABDOMEN - 1 VIEW COMPARISON:  None. FINDINGS: Tip and side-port of the enteric tube below the diaphragm in the stomach. No bowel obstruction in the visualized upper abdomen. Bibasilar opacities and perihilar edema, as seen on recent chest radiograph. IMPRESSION: Tip and side port of the enteric tube in the stomach Electronically Signed   By: Jeb Levering M.D.   On: 10/17/2017 01:35   EKG None  Radiology No results found.  Procedures Procedures (including critical care time)  Medications Ordered in ED Medications  EPINEPHrine (ADRENALIN) 4 mg in dextrose 5 % 250 mL (0.016 mg/mL) infusion (has no administration in time range)  LORazepam (ATIVAN) injection 0.5 mg (has no administration in time range)  LORazepam (ATIVAN) 2 MG/ML injection (has no administration in time range)    Chest x-ray viewed by me.  ET tube with good placement. Initial Impression / Assessment and Plan / ED Course  I have reviewed the triage vital signs and the  nursing notes.  Pertinent labs & imaging results that were available during my care of the patient were reviewed by me and considered in my medical decision making (see chart for details).   Patient placed on ventilator.  Rate 20 PRBC FiO2 100% PEEP 5 tidal volume 500.  I consulted critical care who will come to evaluate patient  Normal saline intravenous drip started at 100 mL/h.  Also IV potassium supplementation ordered.  I had a lengthy conversation with multiple family members.  For now family states that patient would want resuscitation however she would not want to be on sustained life support.. Palliative care consult ordered Blood gas reviewed.  I suggest adjust oxygen to maintain pulse oximetry of 92%  9:45 AM patient was bucking the ventilator.  Ativan 0.5 mill grams IV ordered for sedation.  10 25 AM she appears comfortable on the ventilator.  [place] AM intensivist physician Dr. Pearline Cables arrived and took over care of patient.  He will arrange for admission to intensive care unit Since arrival she has required epinephrine intravenous drip to maintain mean arterial blood pressure of 65 lab work consistent with hyponatremia hypokalemia and hyperglycemia anemia Final Clinical Impressions(s) / ED Diagnoses  Diagnosis #1 cardiac arrest  #2 hyponatremia  #3  hypokalemia #4 anemia CRITICAL CARE Performed by: Orlie Dakin Total critical care time: 65 minutes Critical care time was exclusive of separately billable procedures and treating other patients. Critical care was necessary to treat or prevent imminent or life-threatening deterioration. Critical care was time spent personally by me on the following activities: development of treatment plan with patient and/or surrogate as well as nursing, discussions with consultants, evaluation of patient's response to treatment, examination of patient, obtaining history from patient or surrogate, ordering and performing treatments and  interventions, ordering and review of laboratory studies, ordering and review of radiographic studies, pulse oximetry and re-evaluation of patient's condition. Final diagnoses:  None    ED Discharge Orders    None       Orlie Dakin, MD 11/07/2017 1052

## 2017-11-09 NOTE — Procedures (Signed)
Arterial Catheter Insertion Procedure Note [name] [id number] 05-Dec-1920  Procedure: Insertion of Arterial Catheter  Indications: Blood pressure monitoring and Frequent blood sampling  Procedure Details Consent: Unable to obtain consent because of emergent medical necessity. Time Out: Verified patient identification, verified procedure, site/side was marked, verified correct patient position, special equipment/implants available, medications/allergies/relevent history reviewed, required imaging and test results available.  Performed  Maximum sterile technique was used including antiseptics, cap, gloves, gown, hand hygiene, mask and sheet. Skin prep: Chlorhexidine; local anesthetic administered 20 gauge catheter was inserted into right radial artery using the Seldinger technique.  Evaluation Blood flow good; BP tracing good. Complications: No apparent complications. RT placed arrow catheter on first attempt   [name] 11/06/2017

## 2017-11-09 NOTE — Procedures (Signed)
Central venous catheter placement  Location: Administration of pressors  After explaining the indications and potential risks to family central venous catheter was placed for the administration of pressors.  A timeout was performed in the area of the left subclavian prepped with chlorhexidine.  The area was widely draped in sterile garb was donned.  The vein was easily cannulated and a wire gently passed.  The tract was dilated and a 7 French triple-lumen catheter passed over the wire.  The catheter was sutured in place at 19 cm.  There was good flow from the distal ports the proximal port flushes easily but pulls back sluggishly.    Chest x-ray for placement is pending

## 2017-11-09 NOTE — ED Notes (Signed)
CCM at bedside- central line placed

## 2017-11-09 NOTE — H&P (Signed)
PULMONARY / CRITICAL CARE MEDICINE   Name: [name] MRN: [id number] DOB: [birth year]    ADMISSION DATE:  10/13/2017  CHIEF COMPLAINT:  Out of Hospital Cardiac Arrest  HISTORY OF PRESENT ILLNESS:        This is a 84 year old with congestive heart failure attributable to mitral valve regurgitation who is suffered from out of hospital cardiac arrest.  She was last seen normal last night and was unresponsive when morning pills were brought to her in a skilled nursing facility.  It is not clear when CPR was initiated and there was a 30-minute lag between the call and the arrival of EMS.  When EMS arrived she was asystolic.  She has received 2 doses of epinephrine and was placed on an epinephrine drip with return of spontaneous circulation.  She was last seen on this hospital earlier this month when she was admitted with dyspnea and an echocardiogram showed an ejection fraction of 60-65% with moderate to severe mitral regurgitation.  It appears she has been undergoing aggressive diuresis.  Subsequent to discharge she had some difficulties with cough and was placed on Levaquin and was placed in rehabilitation.  He has had some nausea this week no chest pain.  PAST MEDICAL HISTORY :  She  has a past medical history of Anemia, Arthritis, Asthma, Cataract, Chronic respiratory failure with hypoxia ([place]), Colitis, Colon cancer ([place]), Diabetes mellitus without complication ([place]), Diverticulitis, DVT (deep venous thrombosis) ([place]), Essential hypertension, Family history of adverse reaction to anesthesia, GERD (gastroesophageal reflux disease), Hearing impaired, Hiatal hernia, History of kidney stones, Hyperlipidemia, Hypertension, Pneumonia, PONV (postoperative nausea and vomiting), Seasonal allergies, and TIA (transient ischemic attack).  PAST SURGICAL HISTORY: She  has a past surgical history that includes Cholecystectomy; Appendectomy; Tonsillectomy; Total abdominal hysterectomy; Colon resection (9242);  Kidney surgery (1966); Eye surgery; Joint replacement; and Total hip arthroplasty (Right, 07/11/2017).  Allergies  Allergen Reactions  . Albuterol Anxiety    Tachycardia, Tachypnea  . Prochlorperazine Other (See Comments)    tremor  . Celecoxib Nausea And Vomiting and Other (See Comments)    Body aches  . Codeine Nausea And Vomiting    Tolerates hydrocodone  . Fish Oil Nausea And Vomiting  . Fosamax [Alendronate Sodium] Nausea And Vomiting  . Morphine And Related Nausea And Vomiting  . Moxifloxacin Other (See Comments)    fatigue  . Oxycodone Nausea And Vomiting    Tolerates hydrocodone  . Pregabalin Swelling  . Propoxyphene Nausea And Vomiting  . Statins Other (See Comments)    Muscle pain  . Aspirin-Dipyridamole Er Other (See Comments)    Headcache  . Buprenorphine Hcl Nausea And Vomiting    No current facility-administered medications on file prior to encounter.    Current Outpatient Medications on File Prior to Encounter  Medication Sig  . acetaminophen (TYLENOL) 500 MG tablet Take 500 mg by mouth daily.  Marland Kitchen aspirin EC 81 MG tablet Take 81 mg by mouth 2 (two) times a week. on Sunday and Thursday only.  . bisoprolol (ZEBETA) 5 MG tablet Take 0.5 tablets (2.5 mg total) by mouth daily.  . Calcium Carbonate-Vit D-Min (CALCIUM 600+D PLUS MINERALS) 600-400 MG-UNIT CHEW Chew 2 tablets every morning by mouth.   . Cholecalciferol (VITAMIN D3) 10000 units TABS Take 10,000 Units 4 (four) times a week by mouth. Monday, Tuesday, Wednesday, & Thursday  . clopidogrel (PLAVIX) 75 MG tablet Take 1 tablet (75 mg total) by mouth daily.  Marland Kitchen dexlansoprazole (DEXILANT) 60 MG capsule TAKE 1 CAPSULE  BY MOUTH EVERY MORNING BEFORE BREAKFAST.  Marland Kitchen dimenhyDRINATE (DRAMAMINE) 50 MG tablet Take 50 mg every 8 (eight) hours as needed by mouth (for dizziness/vertigo).   . feeding supplement (BOOST / RESOURCE BREEZE) LIQD Take 1 Container by mouth 2 (two) times daily.  . ferrous sulfate 325 (65 FE) MG tablet  Take 325 mg by mouth every Monday, Wednesday, and Friday. In the morning with breakfast.  . fluticasone (FLONASE) 50 MCG/ACT nasal spray Place 1 spray 2 (two) times daily as needed into both nostrils (for allergies.).   Marland Kitchen fluticasone furoate-vilanterol (BREO ELLIPTA) 100-25 MCG/INH AEPB Inhale 1 puff into the lungs daily.  . furosemide (LASIX) 40 MG tablet Take 1 tablet (40 mg total) by mouth 2 (two) times daily. (Patient taking differently: Take 40 mg by mouth daily. )  . gabapentin (NEURONTIN) 100 MG capsule Take 1 capsule (100 mg total) by mouth daily.  . isosorbide mononitrate (ISMO,MONOKET) 10 MG tablet Take 10 mg by mouth 2 (two) times daily.  Marland Kitchen loratadine (CLARITIN) 10 MG tablet Take 10 mg by mouth daily.   Marland Kitchen omeprazole (PRILOSEC) 40 MG capsule Take 40 mg by mouth daily.  . ondansetron (ZOFRAN) 8 MG tablet Take 8 mg by mouth every 8 (eight) hours.  Vladimir Faster Glycol-Propyl Glycol (LUBRICANT EYE DROPS) 0.4-0.3 % SOLN Apply 1 drop to eye 4 (four) times daily.   . polyethylene glycol (MIRALAX / GLYCOLAX) packet Take 17 g by mouth daily.  . Saccharomyces boulardii (PROBIOTIC) 250 MG CAPS Take 1 capsule by mouth daily with lunch.  . sodium chloride (OCEAN) 0.65 % SOLN nasal spray Place 1 spray into both nostrils 4 (four) times daily as needed for congestion.  Marland Kitchen tiotropium (SPIRIVA) 18 MCG inhalation capsule Place 18 mcg into inhaler and inhale daily.    FAMILY HISTORY:  Her indicated that her mother is deceased. She indicated that her father is deceased. She indicated that the status of her sister is unknown. She indicated that only one of her two brothers is alive.   SOCIAL HISTORY: She  reports that she has quit smoking. Her smoking use included cigarettes. She has never used smokeless tobacco. She reports that she does not drink alcohol or use drugs.  REVIEW OF SYSTEMS:   According to family at baseline she still drives, walks and participates in her ADLs.  She has had TIAs in the past  but there is no history of stroke or seizure.  She has recently had a cough which was treated with Levaquin as noted.  Pertinent to her CHF her Lasix was discontinued earlier this week due to a "low chloride."She has a history of asthma.  SUBJECTIVE:  Not obtainable  VITAL SIGNS: BP (!) 111/53   Pulse 92   Temp (!) 97.5 F (36.4 C)   Resp 15   Ht 5\' 7"  (1.702 m)   SpO2 97%   BMI 19.89 kg/m   HEMODYNAMICS:    VENTILATOR SETTINGS: Vent Mode: PRVC FiO2 (%):  [60 %] 60 % Set Rate:  [16 bmp-20 bmp] 16 bmp Vt Set:  [500 mL] 500 mL PEEP:  [5 cmH20] 5 cmH20 Plateau Pressure:  [18 cmH20] 18 cmH20  INTAKE / OUTPUT: No intake/output data recorded.  PHYSICAL EXAMINATION: General: This is an elderly female who is intubated and mechanically ventilated.  She is not in distress. Neuro: There is no response to voice.  In response to sternal rub she moves the left extremities much more aggressively than the right.  She is tremulous  on the left.  Pupils are equal.  EOMs appear to be intact. Cardiovascular: S1 and S2 are regular without murmur rub or gallop.  Currently has only trace edema. Lungs: Is orally intubated, there is symmetric air movement lots and lots of scattered rhonchi bilaterally and no wheezes. Abdomen: The abdomen is flat and soft without any organomegaly masses tenderness guarding or rebound.  She is anicteric.   LABS:  BMET Recent Labs  Lab 11/04/17 11/05/17 1129 10/18/2017 0934  NA 126* 129* 122*  K 3.7 4.5 3.4*  CL  --  72* 73*  CO2  --  39*  --   BUN 9 10 7   CREATININE 0.6 0.68 0.90  GLUCOSE  --  135* 279*    Electrolytes Recent Labs  Lab 11/05/17 1129  CALCIUM 9.6    CBC Recent Labs  Lab 11/05/17 1129 11/06/2017 0925 10/11/2017 0934  WBC 8.2 16.8*  --   HGB 10.9* 9.4* 10.2*  HCT 32.5* 29.4* 30.0*  PLT 272 247  --     Coag's No results for input(s): APTT, INR in the last 168 hours.  Sepsis Markers No results for input(s): LATICACIDVEN,  PROCALCITON, O2SATVEN in the last 168 hours.  ABG Recent Labs  Lab 10/26/2017 1021  PHART 7.491*  PCO2ART 48.4*  PO2ART 213.0*    Liver Enzymes No results for input(s): AST, ALT, ALKPHOS, BILITOT, ALBUMIN in the last 168 hours.  Cardiac Enzymes No results for input(s): TROPONINI, PROBNP in the last 168 hours.  Glucose No results for input(s): GLUCAP in the last 168 hours.  Imaging Dg Chest Portable 1 View  Result Date: 10/24/2017 CLINICAL DATA:  Post CPR.  Intubation. EXAM: PORTABLE CHEST 1 VIEW COMPARISON:  10/19/2017 FINDINGS: Endotracheal tube terminates 3.3 cm above carina.External pacer/defibrillator. Hyperinflation. Midline trachea. Cardiomegaly accentuated by AP portable technique. Atherosclerosis in the transverse aorta. Probable layering small right pleural effusion. No pneumothorax. Interstitial edema is mild, improved since the prior. Worse on the right. Improved bibasilar airspace disease. IMPRESSION: Endotracheal tube 3.3 cm above carina. Improved interstitial edema with probable small right pleural effusion and bibasilar airspace disease/atelectasis. Electronically Signed   By: Abigail Miyamoto M.D.   On: 10/18/2017 10:08      DISCUSSION:      This is a 84 year old who is suffered from an out of hospital cardiac arrest.  In light of the patient's good functional status family feels it is appropriate to support aggressively.  She has a history of CHF attributable to mitral regurgitation and is profoundly hyponatremic on arrival.  ASSESSMENT / PLAN:  PULMONARY A: He is intubated primarily for airway protection.  Gas exchange is actually quite good despite the scattered rhonchi.  I have ordered a sputum culture and empirically resume Levaquin for presumed out of hospital acquired process or aspiration.   CARDIOVASCULAR A: The provocation for her arrest is not overt.  She has a focal neurological examination and I have ordered a CT scan to rule out a CNS insult.  I am also  concerned that she may be post ictal with her sodium of 122 and of ordered an EEG I am could correcting her sodium.  PE is a distant consideration, have ordered Dopplers of lower extremities but will be holding off on a CTA for now.  I have explained to family the process of therapeutic hypothermia, they are aware that we will not have a good feel for what her ultimate neurologic outcome will be likely for another 48 hours.  GASSTROINTESTINAL A:  Reflexes will be with Protonix   HEMATOLOGIC A: She is on prophylactic dose heparin for now   INFECTIOUS A: Levaquin added for possible aspiration or immunity acquired pneumonia as noted   ENDOCRINE A: She is glucose intolerant possibly secondary to her epinephrine. sliding scale insulin has been ordered   NEUROLOGIC A: He is focal as noted with a differential diagnosis including a new focal lesion or postictal state both of which are being addressed  Lars Masson, MD Critical Care Medicine  Pager: [phone]  10/30/2017, 11:38 AM   Rater than 35 minutes was spent in the care of this patient today who is requiring life support measures.

## 2017-11-09 NOTE — Progress Notes (Signed)
Pharmacy Antibiotic Note  [name] is a 84 y.o. female admitted on 10/27/2017 after cardiac arrest. Now treating for possible CAP. Pharmacy has been consulted for Levaquin dosing.  Plan: Start Levaquin 500 mg IV Q48h Monitor clinical picture, renal function F/U C&S, abx deescalation / LOT  Height: 5\' 7"  (170.2 cm) IBW/kg (Calculated) : 61.6  Temp (24hrs), Avg:97.5 F (36.4 C), Min:97.5 F (36.4 C), Max:97.5 F (36.4 C)  Recent Labs  Lab 11/04/17 11/05/17 1129 11/01/2017 0925 11/01/2017 0934  WBC  --  8.2 16.8*  --   CREATININE 0.6 0.68  --  0.90    Estimated Creatinine Clearance: 34.8 mL/min (by C-G formula based on SCr of 0.9 mg/dL).    Allergies  Allergen Reactions  . Albuterol Anxiety    Tachycardia, Tachypnea  . Prochlorperazine Other (See Comments)    tremor  . Celecoxib Nausea And Vomiting and Other (See Comments)    Body aches  . Codeine Nausea And Vomiting    Tolerates hydrocodone  . Fish Oil Nausea And Vomiting  . Fosamax [Alendronate Sodium] Nausea And Vomiting  . Morphine And Related Nausea And Vomiting  . Moxifloxacin Other (See Comments)    fatigue  . Oxycodone Nausea And Vomiting    Tolerates hydrocodone  . Pregabalin Swelling  . Propoxyphene Nausea And Vomiting  . Statins Other (See Comments)    Muscle pain  . Aspirin-Dipyridamole Er Other (See Comments)    Headcache  . Buprenorphine Hcl Nausea And Vomiting    Thank you for allowing pharmacy to be a part of this patient's care.  Reginia Naas 10/31/2017 11:11 AM

## 2017-11-10 ENCOUNTER — Inpatient Hospital Stay (HOSPITAL_COMMUNITY): Payer: Medicare Other

## 2017-11-10 ENCOUNTER — Encounter (HOSPITAL_COMMUNITY): Payer: Medicare Other

## 2017-11-10 DIAGNOSIS — R570 Cardiogenic shock: Secondary | ICD-10-CM

## 2017-11-10 DIAGNOSIS — I5031 Acute diastolic (congestive) heart failure: Secondary | ICD-10-CM

## 2017-11-10 DIAGNOSIS — Z515 Encounter for palliative care: Secondary | ICD-10-CM

## 2017-11-10 DIAGNOSIS — Z7189 Other specified counseling: Secondary | ICD-10-CM

## 2017-11-10 LAB — BASIC METABOLIC PANEL
ANION GAP: 10 (ref 5–15)
ANION GAP: 11 (ref 5–15)
Anion gap: 12 (ref 5–15)
Anion gap: 13 (ref 5–15)
BUN: 8 mg/dL (ref 6–20)
BUN: 7 mg/dL (ref 6–20)
BUN: 8 mg/dL (ref 6–20)
BUN: 9 mg/dL (ref 6–20)
CALCIUM: 8.1 mg/dL — AB (ref 8.9–10.3)
CALCIUM: 8 mg/dL — AB (ref 8.9–10.3)
CALCIUM: 8.3 mg/dL — AB (ref 8.9–10.3)
CHLORIDE: 83 mmol/L — AB (ref 101–111)
CO2: 35 mmol/L — ABNORMAL HIGH (ref 22–32)
CO2: 32 mmol/L (ref 22–32)
CO2: 33 mmol/L — ABNORMAL HIGH (ref 22–32)
CO2: 33 mmol/L — ABNORMAL HIGH (ref 22–32)
CREATININE: 0.69 mg/dL (ref 0.44–1.00)
CREATININE: 0.74 mg/dL (ref 0.44–1.00)
Calcium: 7.9 mg/dL — ABNORMAL LOW (ref 8.9–10.3)
Chloride: 82 mmol/L — ABNORMAL LOW (ref 101–111)
Chloride: 86 mmol/L — ABNORMAL LOW (ref 101–111)
Chloride: 84 mmol/L — ABNORMAL LOW (ref 101–111)
Creatinine, Ser: 0.7 mg/dL (ref 0.44–1.00)
Creatinine, Ser: 0.68 mg/dL (ref 0.44–1.00)
GFR calc Af Amer: >60 mL/min (ref >60)
GFR calc Af Amer: >60 mL/min (ref >60)
GFR calc Af Amer: >60 mL/min (ref >60)
GFR calc non Af Amer: >60 mL/min (ref >60)
GLUCOSE: 130 mg/dL — AB (ref 65–99)
Glucose, Bld: 81 mg/dL (ref 65–99)
Glucose, Bld: 90 mg/dL (ref 65–99)
Glucose, Bld: 175 mg/dL — ABNORMAL HIGH (ref 65–99)
POTASSIUM: 4.4 mmol/L (ref 3.5–5.1)
POTASSIUM: 4 mmol/L (ref 3.5–5.1)
Potassium: 4.4 mmol/L (ref 3.5–5.1)
Potassium: 3.9 mmol/L (ref 3.5–5.1)
SODIUM: 127 mmol/L — AB (ref 135–145)
SODIUM: 130 mmol/L — AB (ref 135–145)
SODIUM: 130 mmol/L — AB (ref 135–145)
Sodium: 127 mmol/L — ABNORMAL LOW (ref 135–145)

## 2017-11-10 LAB — POCT I-STAT 3, ART BLOOD GAS (G3+)
ACID-BASE EXCESS: 11 mmol/L — AB (ref 0.0–2.0)
Bicarbonate: 36.9 mmol/L — ABNORMAL HIGH (ref 20.0–28.0)
O2 Saturation: 97 %
PH ART: 7.433 (ref 7.350–7.450)
PO2 ART: 91 mmHg (ref 83.0–108.0)
TCO2: 39 mmol/L — ABNORMAL HIGH (ref 22–32)
pCO2 arterial: 55.4 mmHg — ABNORMAL HIGH (ref 32.0–48.0)

## 2017-11-10 LAB — CBC
HEMATOCRIT: 26.1 % — AB (ref 36.0–46.0)
HEMOGLOBIN: 8.8 g/dL — AB (ref 12.0–15.0)
MCH: 29.6 pg (ref 26.0–34.0)
MCHC: 33.7 g/dL (ref 30.0–36.0)
MCV: 87.9 fL (ref 78.0–100.0)
Platelets: 192 10*3/uL (ref 150–400)
RBC: 2.97 MIL/uL — ABNORMAL LOW (ref 3.87–5.11)
RDW: 15.3 % (ref 11.5–15.5)
WBC: 12.7 10*3/uL — ABNORMAL HIGH (ref 4.0–10.5)

## 2017-11-10 LAB — GLUCOSE, CAPILLARY
GLUCOSE-CAPILLARY: 92 mg/dL (ref 65–99)
GLUCOSE-CAPILLARY: 114 mg/dL — AB (ref 65–99)
Glucose-Capillary: 92 mg/dL (ref 65–99)
Glucose-Capillary: 141 mg/dL — ABNORMAL HIGH (ref 65–99)
Glucose-Capillary: 127 mg/dL — ABNORMAL HIGH (ref 65–99)
Glucose-Capillary: 151 mg/dL — ABNORMAL HIGH (ref 65–99)

## 2017-11-10 LAB — LACTIC ACID, PLASMA: Lactic Acid, Venous: 0.8 mmol/L (ref 0.5–1.9)

## 2017-11-10 LAB — BLOOD GAS, ARTERIAL
Acid-Base Excess: 14.3 mmol/L — ABNORMAL HIGH (ref 0.0–2.0)
BICARBONATE: 38.5 mmol/L — AB (ref 20.0–28.0)
Drawn by: 347621
FIO2: 40
MECHVT: 500 mL
O2 Saturation: 98.7 %
PATIENT TEMPERATURE: 98.6
PEEP/CPAP: 5 cmH2O
PO2 ART: 104 mmHg (ref 83.0–108.0)
RATE: 16 resp/min
pCO2 arterial: 48.9 mmHg — ABNORMAL HIGH (ref 32.0–48.0)
pH, Arterial: 7.508 — ABNORMAL HIGH (ref 7.350–7.450)

## 2017-11-10 LAB — PHOSPHORUS: PHOSPHORUS: 1.5 mg/dL — AB (ref 2.5–4.6)

## 2017-11-10 LAB — TROPONIN I
TROPONIN I: 0.66 ng/mL — AB (ref <0.03)
TROPONIN I: 0.48 ng/mL — AB (ref <0.03)
TROPONIN I: 0.31 ng/mL — AB (ref <0.03)

## 2017-11-10 LAB — PROCALCITONIN: Procalcitonin: 1.48 ng/mL

## 2017-11-10 LAB — MAGNESIUM: MAGNESIUM: 1.4 mg/dL — AB (ref 1.7–2.4)

## 2017-11-10 MED ORDER — ORAL CARE MOUTH RINSE
15.0000 mL | Freq: Two times a day (BID) | OROMUCOSAL | Status: DC
  Administered 2017-11-11: 15 mL via OROMUCOSAL

## 2017-11-10 MED ORDER — ONDANSETRON HCL 4 MG/2ML IJ SOLN
INTRAMUSCULAR | Status: AC
  Administered 2017-11-10: 4 mg
  Filled 2017-11-10: qty 2

## 2017-11-10 MED ORDER — PIPERACILLIN-TAZOBACTAM 3.375 G IVPB
3.3750 g | Freq: Three times a day (TID) | INTRAVENOUS | Status: DC
  Administered 2017-11-10 – 2017-11-11 (×4): 3.375 g via INTRAVENOUS
  Filled 2017-11-10 (×5): qty 50

## 2017-11-10 MED ORDER — SODIUM GLYCEROPHOSPHATE 1 MMOLE/ML IV SOLN
20.0000 mmol | Freq: Once | INTRAVENOUS | Status: AC
  Administered 2017-11-10: 20 mmol via INTRAVENOUS
  Filled 2017-11-10: qty 20

## 2017-11-10 MED ORDER — NICARDIPINE HCL IN NACL 20-0.86 MG/200ML-% IV SOLN
3.0000 mg/h | INTRAVENOUS | Status: DC
  Administered 2017-11-10: 5 mg/h via INTRAVENOUS
  Filled 2017-11-10: qty 200

## 2017-11-10 MED ORDER — POTASSIUM CHLORIDE 10 MEQ/50ML IV SOLN
10.0000 meq | INTRAVENOUS | Status: AC
  Administered 2017-11-10 (×2): 10 meq via INTRAVENOUS
  Filled 2017-11-10: qty 50

## 2017-11-10 MED ORDER — AMIODARONE LOAD VIA INFUSION
150.0000 mg | Freq: Once | INTRAVENOUS | Status: AC
  Administered 2017-11-10: 150 mg via INTRAVENOUS
  Filled 2017-11-10: qty 83.34

## 2017-11-10 MED ORDER — AMIODARONE HCL IN DEXTROSE 360-4.14 MG/200ML-% IV SOLN
INTRAVENOUS | Status: AC
  Administered 2017-11-10: 18:00:00
  Filled 2017-11-10: qty 200

## 2017-11-10 MED ORDER — HYDRALAZINE HCL 20 MG/ML IJ SOLN
10.0000 mg | Freq: Four times a day (QID) | INTRAMUSCULAR | Status: DC | PRN
  Administered 2017-11-10: 10 mg via INTRAVENOUS
  Filled 2017-11-10 (×2): qty 1

## 2017-11-10 MED ORDER — AMIODARONE HCL IN DEXTROSE 360-4.14 MG/200ML-% IV SOLN
30.0000 mg/h | INTRAVENOUS | Status: DC
  Administered 2017-11-11: 30 mg/h via INTRAVENOUS
  Filled 2017-11-10 (×2): qty 200

## 2017-11-10 MED ORDER — FENTANYL CITRATE (PF) 100 MCG/2ML IJ SOLN
12.5000 ug | INTRAMUSCULAR | Status: DC | PRN
  Administered 2017-11-10: 12.5 ug via INTRAVENOUS
  Filled 2017-11-10: qty 2

## 2017-11-10 MED ORDER — FUROSEMIDE 10 MG/ML IJ SOLN
60.0000 mg | Freq: Once | INTRAMUSCULAR | Status: AC
  Administered 2017-11-10: 60 mg via INTRAVENOUS
  Filled 2017-11-10: qty 6

## 2017-11-10 MED ORDER — HYDRALAZINE HCL 20 MG/ML IJ SOLN
10.0000 mg | Freq: Once | INTRAMUSCULAR | Status: AC
  Administered 2017-11-10: 10 mg via INTRAVENOUS

## 2017-11-10 MED ORDER — METOPROLOL TARTRATE 5 MG/5ML IV SOLN
INTRAVENOUS | Status: AC
  Administered 2017-11-10: 5 mg
  Filled 2017-11-10: qty 5

## 2017-11-10 MED ORDER — ACETAMINOPHEN 160 MG/5ML PO SOLN: 650.0000 mg | Freq: Four times a day (QID) | ORAL | Status: DC | PRN

## 2017-11-10 MED ORDER — MAGNESIUM SULFATE 2 GM/50ML IV SOLN
2.0000 g | Freq: Once | INTRAVENOUS | Status: AC
  Administered 2017-11-10: 2 g via INTRAVENOUS
  Filled 2017-11-10: qty 50

## 2017-11-10 MED ORDER — METOPROLOL TARTRATE 5 MG/5ML IV SOLN: 5.0000 mg | INTRAVENOUS | Status: DC | PRN

## 2017-11-10 MED ORDER — AMIODARONE HCL IN DEXTROSE 360-4.14 MG/200ML-% IV SOLN
60.0000 mg/h | INTRAVENOUS | Status: AC
Start: 2017-11-10 — End: 2017-11-10
  Administered 2017-11-10 (×2): 60 mg/h via INTRAVENOUS

## 2017-11-10 MED ORDER — ONDANSETRON HCL 4 MG/2ML IJ SOLN
4.0000 mg | Freq: Four times a day (QID) | INTRAMUSCULAR | Status: DC | PRN
  Administered 2017-11-11: 4 mg via INTRAVENOUS
  Filled 2017-11-10 (×2): qty 2

## 2017-11-10 NOTE — Progress Notes (Signed)
Waste 150 of fentanyl with Gladys Damme RN at [phone].

## 2017-11-10 NOTE — Procedures (Signed)
Extubation Procedure Note  Patient Details:   Name: [name] DOB: [birth year] MRN: [id number]   Airway Documentation:   Patient extubated per orders. Patient had positive cuff leak prior to extubation. Pt placed on 4lpm humidified oxygen post extubation. Incentive spirometry instructed with teach back. Will continue to monitor. RN and family at bedside.  Evaluation  O2 sats: stable throughout Complications: No apparent complications Patient did tolerate procedure well. Bilateral Breath Sounds: Diminished, Rhonchi   Yes  Ander Purpura 11/10/2017, 11:27 AM

## 2017-11-10 NOTE — Progress Notes (Signed)
[facility] Progress Note Patient Name: TAM SAVOIA DOB: [birth year] MRN: [id number]   Date of Service  11/10/2017  HPI/Events of Note  K+ = 3.9, Mg++ = 1.4 and Creatinine = 0.74.  eICU Interventions  Will replace Mg++ and K+.      Intervention Category Major Interventions: Electrolyte abnormality - evaluation and management  Sommer,Steven Eugene 11/10/2017, 9:32 PM

## 2017-11-10 NOTE — Progress Notes (Signed)
eLink Physician-Brief Progress Note Patient Name: [name] DOB: 07/02/1921 MRN: [id number]   Date of Service  11/10/2017  HPI/Events of Note  Patient c/o chest pain related to CPR. Bedside nurse feels that she can't take PO d/t hoarseness and concerns for swallowing. Patient has multiple pain medication allergies reported.   eICU Interventions  Will order: 1. Fentanyl 12.5 mcg IV Q 4 hours PRN pain.      Intervention Category Intermediate Interventions: Pain - evaluation and management  [name] 11/10/2017, 11:42 PM

## 2017-11-10 NOTE — Progress Notes (Addendum)
eLink Physician-Brief Progress Note Patient Name: YARITSA SAVARINO DOB: 03/13/1921 MRN: [id number]   Date of Service  11/10/2017  HPI/Events of Note  Multiple issues: 1.Fever to 100.6 F - Request for Tylenol. Last AST and ALT normal. Presently on a course of Levaquin. WBC = 28.2. Tracheal aspirate culture pending and 2. Lactic Acid = 5.4. Hx of severe MR.   eICU Interventions  Will order: 1. Tylenol liquid 650 mg per tube Q 6 hours PRN Temp >  100.5 F. 2. Blood cultures X 2 now. 3. D/C Levaquin.  4. Zosyn per pharmacy consultation.  5. Given recent cardiac arrest and severe MR may be slow to clear lactic acid. Continue to trend lactic acid.      Intervention Category Major Interventions: Infection - evaluation and management  Sommer,Steven Eugene 11/10/2017, 1:27 AM

## 2017-11-10 NOTE — H&P (Signed)
PULMONARY / CRITICAL CARE MEDICINE   Name: [name] MRN: [id number] DOB: 03-20-1921    ADMISSION DATE:  10/18/2017  CHIEF COMPLAINT:  Out of Hospital Cardiac Arrest  HISTORY OF PRESENT ILLNESS:        This is a 84 year old with congestive heart failure attributable to mitral valve regurgitation who is suffered from out of hospital cardiac arrest.  She was last seen normal last night and was unresponsive when morning pills were brought to her in a skilled nursing facility.  It is not clear when CPR was initiated and there was a 30-minute lag between the call and the arrival of EMS.  When EMS arrived she was asystolic.  She has received 2 doses of epinephrine and was placed on an epinephrine drip with return of spontaneous circulation.  She was last seen on this hospital earlier this month when she was admitted with dyspnea and an echocardiogram showed an ejection fraction of 60-65% with moderate to severe mitral regurgitation.  It appears she has been undergoing aggressive diuresis.  Subsequent to discharge she had some difficulties with cough and was placed on Levaquin and was placed in rehabilitation.  He has had some nausea this week no chest pain.  Interval Hx: Patient is awake and following commands.  Remains on the vent and is on levophed 36mcg  PAST MEDICAL HISTORY :  She  has a past medical history of Anemia, Arthritis, Asthma, Cataract, Chronic respiratory failure with hypoxia ([place]), Colitis, Colon cancer ([place]), Diabetes mellitus without complication ([place]), Diverticulitis, DVT (deep venous thrombosis) ([place]), Essential hypertension, Family history of adverse reaction to anesthesia, GERD (gastroesophageal reflux disease), Hearing impaired, Hiatal hernia, History of kidney stones, Hyperlipidemia, Hypertension, Pneumonia, PONV (postoperative nausea and vomiting), Seasonal allergies, and TIA (transient ischemic attack).  PAST SURGICAL HISTORY: She  has a past surgical history that includes  Cholecystectomy; Appendectomy; Tonsillectomy; Total abdominal hysterectomy; Colon resection (0737); Kidney surgery (1966); Eye surgery; Joint replacement; and Total hip arthroplasty (Right, 07/11/2017).  Allergies  Allergen Reactions  . Albuterol Anxiety    Tachycardia, Tachypnea  . Prochlorperazine Other (See Comments)    tremor  . Celecoxib Nausea And Vomiting and Other (See Comments)    Body aches  . Codeine Nausea And Vomiting    Tolerates hydrocodone  . Fish Oil Nausea And Vomiting  . Fosamax [Alendronate Sodium] Nausea And Vomiting  . Morphine And Related Nausea And Vomiting  . Moxifloxacin Other (See Comments)    fatigue  . Oxycodone Nausea And Vomiting    Tolerates hydrocodone  . Pregabalin Swelling  . Propoxyphene Nausea And Vomiting  . Statins Other (See Comments)    Muscle pain  . Aspirin-Dipyridamole Er Other (See Comments)    Headcache  . Buprenorphine Hcl Nausea And Vomiting    No current facility-administered medications on file prior to encounter.    Current Outpatient Medications on File Prior to Encounter  Medication Sig  . acetaminophen (TYLENOL) 500 MG tablet Take 500 mg by mouth daily.  Marland Kitchen aspirin EC 81 MG tablet Take 81 mg by mouth 2 (two) times a week. on Sunday and Thursday only.  . bisoprolol (ZEBETA) 5 MG tablet Take 0.5 tablets (2.5 mg total) by mouth daily.  . Calcium Carbonate-Vit D-Min (CALCIUM 600+D PLUS MINERALS) 600-400 MG-UNIT CHEW Chew 2 tablets every morning by mouth.   . Cholecalciferol (VITAMIN D3) 10000 units TABS Take 10,000 Units 4 (four) times a week by mouth. Monday, Tuesday, Wednesday, & Thursday  . clopidogrel (PLAVIX) 75 MG tablet  Take 1 tablet (75 mg total) by mouth daily.  Marland Kitchen dexlansoprazole (DEXILANT) 60 MG capsule TAKE 1 CAPSULE BY MOUTH EVERY MORNING BEFORE BREAKFAST.  Marland Kitchen dimenhyDRINATE (DRAMAMINE) 50 MG tablet Take 50 mg every 8 (eight) hours as needed by mouth (for dizziness/vertigo).   . feeding supplement (BOOST / RESOURCE  BREEZE) LIQD Take 1 Container by mouth 2 (two) times daily.  . ferrous sulfate 325 (65 FE) MG tablet Take 325 mg by mouth every Monday, Wednesday, and Friday. In the morning with breakfast.  . fluticasone (FLONASE) 50 MCG/ACT nasal spray Place 1 spray 2 (two) times daily as needed into both nostrils (for allergies.).   Marland Kitchen fluticasone furoate-vilanterol (BREO ELLIPTA) 100-25 MCG/INH AEPB Inhale 1 puff into the lungs daily.  . furosemide (LASIX) 40 MG tablet Take 1 tablet (40 mg total) by mouth 2 (two) times daily. (Patient taking differently: Take 40 mg by mouth daily. )  . gabapentin (NEURONTIN) 100 MG capsule Take 1 capsule (100 mg total) by mouth daily.  . isosorbide mononitrate (ISMO,MONOKET) 10 MG tablet Take 10 mg by mouth 2 (two) times daily.  Marland Kitchen loratadine (CLARITIN) 10 MG tablet Take 10 mg by mouth daily.   Marland Kitchen omeprazole (PRILOSEC) 40 MG capsule Take 40 mg by mouth daily.  . ondansetron (ZOFRAN) 8 MG tablet Take 8 mg by mouth every 8 (eight) hours.  Vladimir Faster Glycol-Propyl Glycol (LUBRICANT EYE DROPS) 0.4-0.3 % SOLN Apply 1 drop to eye 4 (four) times daily.   . polyethylene glycol (MIRALAX / GLYCOLAX) packet Take 17 g by mouth daily.  . Saccharomyces boulardii (PROBIOTIC) 250 MG CAPS Take 1 capsule by mouth daily with lunch.  . sodium chloride (OCEAN) 0.65 % SOLN nasal spray Place 1 spray into both nostrils 4 (four) times daily as needed for congestion.    FAMILY HISTORY:  Her indicated that her mother is deceased. She indicated that her father is deceased. She indicated that the status of her sister is unknown. She indicated that only one of her two brothers is alive.   SOCIAL HISTORY: She  reports that she has quit smoking. Her smoking use included cigarettes. She has never used smokeless tobacco. She reports that she does not drink alcohol or use drugs.  REVIEW OF SYSTEMS:   According to family at baseline she still drives, walks and participates in her ADLs.  She has had TIAs in  the past but there is no history of stroke or seizure.  She has recently had a cough which was treated with Levaquin as noted.  Pertinent to her CHF her Lasix was discontinued earlier this week due to a "low chloride."She has a history of asthma.  SUBJECTIVE:  Not obtainable  VITAL SIGNS: BP (!) 130/50   Pulse 94   Temp 99.1 F (37.3 C)   Resp 13   Ht 5\' 7"  (1.702 m)   Wt 59.9 kg (132 lb 0.9 oz)   SpO2 100%   BMI 20.68 kg/m   HEMODYNAMICS:    VENTILATOR SETTINGS: Vent Mode: PSV;CPAP FiO2 (%):  [40 %-60 %] 40 % Set Rate:  [16 bmp] 16 bmp Vt Set:  [450 mL-500 mL] 450 mL PEEP:  [5 cmH20] 5 cmH20 Pressure Support:  [12 cmH20] 12 cmH20 Plateau Pressure:  [16 cmH20-17 cmH20] 17 cmH20  INTAKE / OUTPUT: I/O last 3 completed shifts: In: 1427.6 [I.V.:1227.6; IV Piggyback:200] Out: 51 [Urine:880; Emesis/NG output:250]  PHYSICAL EXAMINATION: General: This is an elderly female who is intubated and mechanically ventilated.  She is  not in distress. Neuro: following commands and alert. Moving all limbs  Cardiovascular: S1 and S2 are regular without murmur rub or gallop.  Currently has only trace edema. Lungs: Is orally intubated, there is symmetric air movement lots and lots of scattered rhonchi bilaterally and no wheezes. Abdomen: The abdomen is flat and soft without any organomegaly masses tenderness guarding or rebound.  She is anicteric.   LABS:  BMET Recent Labs  Lab 10/11/2017 1353 10/21/2017 2328 11/10/17 0358  NA 122* 127* 127*  K 3.0* 4.4 4.4  CL 76* 82* 83*  CO2 31 35* 32  BUN 10 8 7   CREATININE 0.88 0.70 0.69  GLUCOSE 272* 81 90    Electrolytes Recent Labs  Lab 11/02/2017 1353 10/31/2017 2328 11/10/17 0358  CALCIUM 8.0* 8.1* 8.0*  PHOS  --   --  1.5*    CBC Recent Labs  Lab 11/01/2017 0925 10/14/2017 0934 10/11/2017 1353 11/10/17 0358  WBC 16.8*  --  28.2* 12.7*  HGB 9.4* 10.2* 9.5* 8.8*  HCT 29.4* 30.0* 29.2* 26.1*  PLT 247  --  246 192     Coag's Recent Labs  Lab 10/26/2017 1353  APTT 27  INR 1.10    Sepsis Markers Recent Labs  Lab 10/22/2017 1353 10/31/2017 1354 11/10/17 0153 11/10/17 0358  LATICACIDVEN  --  5.4* 0.8  --   PROCALCITON 0.61  --   --  1.48    ABG Recent Labs  Lab 11/03/2017 1021 11/10/17 0355 11/10/17 1011  PHART 7.491* 7.508* 7.433  PCO2ART 48.4* 48.9* 55.4*  PO2ART 213.0* 104 91.0    Liver Enzymes No results for input(s): AST, ALT, ALKPHOS, BILITOT, ALBUMIN in the last 168 hours.  Cardiac Enzymes Recent Labs  Lab 10/23/2017 1353 11/08/2017 2211 11/10/17 0358  TROPONINI 0.38* 0.72* 0.66*    Glucose Recent Labs  Lab 10/26/2017 1625 10/20/2017 2010 11/04/2017 2319 11/10/17 0357 11/10/17 0810  GLUCAP 197* 110* 78 92 92    Imaging Ct Head Wo Contrast  Result Date: 10/11/2017 CLINICAL DATA:  Status post cardiac arrest. EXAM: CT HEAD WITHOUT CONTRAST TECHNIQUE: Contiguous axial images were obtained from the base of the skull through the vertex without intravenous contrast. COMPARISON:  Head CT and MRI 01/30/2017 FINDINGS: Brain: There is no evidence of acute infarct, intracranial hemorrhage, mass, midline shift, or extra-axial fluid collection. Mild cerebral atrophy is unchanged and within normal limits for age. Patchy subcortical and periventricular white matter hypodensities are unchanged and nonspecific but compatible with moderate chronic small vessel ischemic disease. Heterogeneous hypoattenuation in the basal ganglia and thalami is again noted in likely also reflects chronic small vessel ischemia. Vascular: Calcified atherosclerosis at the skull base. No hyperdense vessel. Skull: No fracture or focal osseous lesion. Sinuses/Orbits: Minimal bilateral ethmoid air cell mucosal thickening. Clear mastoid air cells. Bilateral cataract extraction. Other: Partially visualized endotracheal and enteric tubes. IMPRESSION: 1. No evidence of acute intracranial abnormality. 2. Moderate chronic small  vessel ischemic disease. Electronically Signed   By: Logan Bores M.D.   On: 10/22/2017 13:16   Dg Chest Port 1 View  Result Date: 10/29/2017 CLINICAL DATA:  Post CPR.  Central line and NG tube placement. EXAM: PORTABLE CHEST 1 VIEW COMPARISON:  10/10/2017 and prior radiographs FINDINGS: Cardiomegaly again noted. An endotracheal tube with tip 4 cm above the carina and defibrillator pads overlying the LEFT chest again noted. A LEFT subclavian central venous catheter with tip overlying the LOWER SVC and NG tube with tip overlying the mid stomach now noted.  Diffuse interstitial and mild airspace opacities bilaterally likely represent edema. Small pleural effusions are likely. There is no evidence of pneumothorax. IMPRESSION: LEFT subclavian central venous catheter placement with tip overlying the LOWER SVC and NG tube placement with tip overlying the mid stomach. No evidence of pneumothorax. Diffuse interstitial and mild airspace opacities likely representing pulmonary edema. Probable small effusions. Electronically Signed   By: Margarette Canada M.D.   On: 10/19/2017 12:37      DISCUSSION:      This is a 84 year old who is suffered from an out of hospital cardiac arrest.  In light of the patient's good functional status family feels it is appropriate to support aggressively.  She has a history of CHF attributable to mitral regurgitation and is profoundly hyponatremic on arrival.  ASSESSMENT / PLAN:  PULMONARY A: He is intubated primarily for airway protection.  Gas exchange is actually quite good despite the scattered rhonchi.   Plan: On empiric Abx for  presumed out of hospital acquired process or aspiration.  CPAP trial    CARDIOVASCULAR A: most likely distributive shock from sedation can not rule out cardiogenic shock  Plan: - wean down levophed  GASSTROINTESTINAL A: GI prophylaxis Protonix   HEMATOLOGIC A: She is on prophylactic dose heparin for now   Renal : Hyponatremia On  NS  INFECTIOUS A:zosyn for possible aspiration or immunity acquired pneumonia as noted   ENDOCRINE A: She is glucose intolerant possibly secondary to her epinephrine. sliding scale insulin has been ordered   NEUROLOGIC A: He is focal as noted with a differential diagnosis including a new focal lesion or postictal state both of which are being addressed   I have spent  35 minutes was spent in the care time bediasde or in the unit

## 2017-11-10 NOTE — Progress Notes (Signed)
Bilateral lower extremity venous duplex completed. There is no obvious evidence of DVT,superficial thrombosis, or Baker's cyst.  Toma Copier, RVS 11/10/2017, 12:24 PM

## 2017-11-10 NOTE — Progress Notes (Signed)
eLink Physician-Brief Progress Note Patient Name: [name] DOB: 1921/05/12 MRN: [id number]   Date of Service  11/10/2017  HPI/Events of Note  PO4--- = 1.5 and Creatinine = 0.69.  eICU Interventions  Will replace PO4---.     Intervention Category Major Interventions: Electrolyte abnormality - evaluation and management  Otha Rickles [place] 11/10/2017, 6:20 AM

## 2017-11-10 NOTE — Progress Notes (Signed)
Pharmacy Antibiotic Note  [name] is a 84 y.o. female admitted on 10/24/2017 with cardiac arrest.  Pharmacy has been consulted for Zosyn dosing. WBC is increasing, pt is febrile, changing from Levaquin to Zosyn.   Plan: Zosyn 3.375G IV q8h to be infused over 4 hours Trend WBC, temp, renal function  F/U infectious work-up  Height: 5\' 7"  (170.2 cm) Weight: 132 lb 0.9 oz (59.9 kg) IBW/kg (Calculated) : 61.6  Temp (24hrs), Avg:99.3 F (37.4 C), Min:96.6 F (35.9 C), Max:100.6 F (38.1 C)  Recent Labs  Lab 11/04/17 11/05/17 1129 11/03/2017 0925 10/11/2017 0934 10/15/2017 1353 11/04/2017 1354 10/25/2017 2328  WBC  --  8.2 16.8*  --  28.2*  --   --   CREATININE 0.6 0.68  --  0.90 0.88  --  0.70  LATICACIDVEN  --   --   --   --   --  5.4*  --     Estimated Creatinine Clearance: 40.7 mL/min (by C-G formula based on SCr of 0.7 mg/dL).    Allergies  Allergen Reactions  . Albuterol Anxiety    Tachycardia, Tachypnea  . Prochlorperazine Other (See Comments)    tremor  . Celecoxib Nausea And Vomiting and Other (See Comments)    Body aches  . Codeine Nausea And Vomiting    Tolerates hydrocodone  . Fish Oil Nausea And Vomiting  . Fosamax [Alendronate Sodium] Nausea And Vomiting  . Morphine And Related Nausea And Vomiting  . Moxifloxacin Other (See Comments)    fatigue  . Oxycodone Nausea And Vomiting    Tolerates hydrocodone  . Pregabalin Swelling  . Propoxyphene Nausea And Vomiting  . Statins Other (See Comments)    Muscle pain  . Aspirin-Dipyridamole Er Other (See Comments)    Headcache  . Buprenorphine Hcl Nausea And Vomiting     [name] 11/10/2017 1:36 AM

## 2017-11-10 NOTE — Consult Note (Addendum)
Consultation Note Date: 11/10/2017   Patient Name: [name]  DOB: [birth year]  MRN: [id number]  Age / Sex: 84 y.o., female  PCP: [name], NP Referring Physician: Sampson Goon, MD  Reason for Consultation: Establishing goals of care  HPI/Patient Profile: 84 y.o. female  with past medical history of asthma, colon cancer, diabetes mellitus, diverticulosis, s/p DVT, GERD, kidney stones, HTN, PONV admitted on 10/24/2017 with out of hospital cardiac arrest found asystole by EMS at [facility]. Had been having issues with diuresis and hyponatremia noted at SNF prior to arrest. Was recently admitted 3/8-3/13 when she was diagnosed with diastolic CHF with severe mitral regurgitation and pulmonary hypertension and was intubated briefly during this admission. Acute arrest and decline appear to be have cardiac etiology.   Clinical Assessment and Goals of Care: I met today at Ms. Choctaw bedside with her daughter Gwenith Spitz husband, and son Shanon Brow. Ms. Depaula was recently extubated and doing well but voice very weak. 28 of my conversation was with her children.   We discussed life review with being so independent and driving and active and even her wonderful recovery from her right hip replacement the end of last year. She was dancing and doing the twist at her Independent Living just on Valentine's Day this year. However she has had some challenges over the past month and required intubation very briefly just a few weeks ago with pulmonary edema and this is when they first learned of her cardiac issues. She struggled with pneumonia (wonder if this was more fluid than infection?), nausea, and hyponatremia since d/c from the hospital. Admitted with cardiac arrest this admission and her children are surprised that she is still alive and were fully prepared to expect the worst. They do understand that she continues to have many  barriers and the question to be answered is if there are any options on the table at this point that are expected to improve quality and quantity of life.   Juliann Pulse did share that her mother has been speaking with her about declining quality and that she cannot continue to live that way. She has referenced missing her deceased brother and told Juliann Pulse that she was NOT afraid to die. She was interested in following up with heart failure team and pursuing possible mitral clip prior to this admission with hopes of improved QOL. They wonder if these would still be potential options for her, if she is still interested, and what her desires are at this point after all she has been through the past month. We are hoping that some of these questions will be answered and I am hopeful that I can engage Ms. Nebel in some of these discussions tomorrow. All questions/concerns addressed. Emotional support provided.   Primary Decision Maker PATIENT HCPOA Lilyan Gilford (followed by other children Arbie Cookey and Shanon Brow)    [place] to discuss [place] more with patient tomorrow along with family - Consider consult to heart failure for input - Would she be  potential candidate for mitral clip?? Likely dependent on continued improvement and her wishes  Code Status/Advance Care Planning:  Full code - to discuss further with patient tomorrow   Symptom Management:   Per PCCM  Palliative Prophylaxis:   Aspiration, Bowel Regimen, Delirium Protocol, Oral Care and Turn Reposition  Additional Recommendations (Limitations, Scope, Preferences):  Full Scope Treatment  Psycho-social/Spiritual:   Desire for further Chaplaincy support:yes  Additional Recommendations: Caregiving  Support/Resources  Prognosis:   Overall prognosis is very poor. Family interested in options to improve QOL as well as quantity. The likelihood of achieving these options will weigh heavily on their decisions.   Discharge  Planning: To Be Determined      Primary Diagnoses: Present on Admission: . Cardiac arrest ([place])   I have reviewed the medical record, interviewed the patient and family, and examined the patient. The following aspects are pertinent.  Past Medical History:  Diagnosis Date  . Anemia    iron deficiency  . Arthritis   . Asthma   . Cataract   . Chronic respiratory failure with hypoxia ([place])   . Colitis   . Colon cancer ([place])    basal cell nose  . Diabetes mellitus without complication ([place])    NO meds ever [place] related to steroid use. Has more problems eith hypoglycemia  . Diverticulitis   . DVT (deep venous thrombosis) ([place])   . Essential hypertension   . Family history of adverse reaction to anesthesia    both daughters have problems getting bp up after anesthesia  . GERD (gastroesophageal reflux disease)   . Hearing impaired   . Hiatal hernia    neuropathy  . History of kidney stones   . Hyperlipidemia   . Hypertension   . Pneumonia   . PONV (postoperative nausea and vomiting)    BP drops really low cant breathe well vomiting  . Seasonal allergies   . TIA (transient ischemic attack)    x2 2 years ago   Social History   Socioeconomic History  . Marital status: Widowed    Spouse name: Not on file  . Number of children: Not on file  . Years of education: Not on file  . Highest education level: Not on file  Occupational History  . Not on file  Social Needs  . Financial resource strain: Not on file  . Food insecurity:    Worry: Not on file    Inability: Not on file  . Transportation needs:    Medical: Not on file    Non-medical: Not on file  Tobacco Use  . Smoking status: Former Smoker    Types: Cigarettes  . Smokeless tobacco: Never Used  Substance and Sexual Activity  . Alcohol use: No  . Drug use: No  . Sexual activity: Not Currently  Lifestyle  . Physical activity:    Days per week: Not on file    Minutes per session: Not on file  . Stress: Not  on file  Relationships  . Social connections:    Talks on phone: Not on file    Gets together: Not on file    Attends religious service: Not on file    Active member of club or organization: Not on file    Attends meetings of clubs or organizations: Not on file    Relationship status: Not on file  Other Topics Concern  . Not on file  Social History Narrative   Patient lives with: alone   Support system: children and  other family members   Type of residence: apartment   Transportation means: family             Family History  Problem Relation Age of Onset  . Breast cancer Mother   . Heart attack Mother   . Emphysema Sister        smoked  . Prostate cancer Brother   . Heart disease Brother   . Atrial fibrillation Brother    Scheduled Meds: . chlorhexidine gluconate (MEDLINE KIT)  15 mL Mouth Rinse BID  . Chlorhexidine Gluconate Cloth  6 each Topical Daily  . heparin  5,000 Units Subcutaneous Q8H  . insulin aspart  0-9 Units Subcutaneous Q4H  . mouth rinse  15 mL Mouth Rinse Q12H  . pantoprazole (PROTONIX) IV  40 mg Intravenous QHS  . sodium chloride flush  10-40 mL Intracatheter Q12H   Continuous Infusions: . sodium chloride    . sodium chloride    . sodium chloride    . 0.9 % NaCl with KCl 20 mEq / L 60 mL/hr at 11/10/17 1200  . epinephrine Stopped (10/14/2017 1451)  . fentaNYL infusion INTRAVENOUS 15 mcg/hr (11/10/17 1301)  . niCARDipine 5 mg/hr (11/10/17 1252)  . norepinephrine (LEVOPHED) Adult infusion Stopped (11/10/17 0910)  . piperacillin-tazobactam (ZOSYN)  IV Stopped (11/10/17 7867)  . potassium chloride    . sodium glycerophosphate 0.9% NaCl IVPB 20 mmol (11/10/17 0652)   PRN Meds:.Place/Maintain arterial line **AND** sodium chloride, acetaminophen (TYLENOL) oral liquid 160 mg/5 mL, fentaNYL (SUBLIMAZE) injection, fentaNYL (SUBLIMAZE) injection, hydrALAZINE, metoprolol tartrate, ondansetron (ZOFRAN) IV, sodium chloride flush Allergies  Allergen Reactions    . Albuterol Anxiety    Tachycardia, Tachypnea  . Prochlorperazine Other (See Comments)    tremor  . Celecoxib Nausea And Vomiting and Other (See Comments)    Body aches  . Codeine Nausea And Vomiting    Tolerates hydrocodone  . Fish Oil Nausea And Vomiting  . Fosamax [Alendronate Sodium] Nausea And Vomiting  . Morphine And Related Nausea And Vomiting  . Moxifloxacin Other (See Comments)    fatigue  . Oxycodone Nausea And Vomiting    Tolerates hydrocodone  . Pregabalin Swelling  . Propoxyphene Nausea And Vomiting  . Statins Other (See Comments)    Muscle pain  . Aspirin-Dipyridamole Er Other (See Comments)    Headcache  . Buprenorphine Hcl Nausea And Vomiting   Review of Systems  Unable to perform ROS: Acuity of condition    Physical Exam  Constitutional: She is oriented to person, place, and time. She appears well-developed.  HENT:  Head: Normocephalic and atraumatic.  Cardiovascular: Tachycardia present.  Pulmonary/Chest: Effort normal. No accessory muscle usage. No tachypnea. No respiratory distress.  Abdominal: Normal appearance.  Neurological: She is alert and oriented to person, place, and time.  Oriented by voice weak from intubation  Nursing note and vitals reviewed.   Vital Signs: BP 132/64   Pulse (!) 126   Temp 99 F (37.2 C)   Resp (!) 22   Ht 5' 7" (1.702 m)   Wt 59.9 kg (132 lb 0.9 oz)   SpO2 99%   BMI 20.68 kg/m  Pain Scale: CPOT   Pain Score: 0-No pain   SpO2: SpO2: 99 % O2 Device:SpO2: 99 % O2 Flow Rate: .O2 Flow Rate (L/min): 4 L/min  IO: Intake/output summary:   Intake/Output Summary (Last 24 hours) at 11/10/2017 1321 Last data filed at 11/10/2017 1200 Gross per 24 hour  Intake 1743.92 ml  Output 1505 ml  Net 238.92 ml    LBM: Last BM Date: (PTA) Baseline Weight: Weight: 59.9 kg (132 lb 0.9 oz) Most recent weight: Weight: 59.9 kg (132 lb 0.9 oz)     Palliative Assessment/Data:     Time Total: 70 min  Greater than 50%  of  this time was spent counseling and coordinating care related to the above assessment and plan.  Signed by: Vinie Sill, NP Palliative Medicine Team Pager # [phone] (M-F 8a-5p) Team Phone # [phone] (Nights/Weekends)

## 2017-11-10 DEATH — deceased

## 2017-11-11 ENCOUNTER — Inpatient Hospital Stay (HOSPITAL_COMMUNITY): Payer: Medicare Other

## 2017-11-11 ENCOUNTER — Other Ambulatory Visit: Payer: Self-pay

## 2017-11-11 ENCOUNTER — Encounter (HOSPITAL_COMMUNITY): Payer: Self-pay

## 2017-11-11 DIAGNOSIS — J80 Acute respiratory distress syndrome: Secondary | ICD-10-CM

## 2017-11-11 LAB — CBC WITH DIFFERENTIAL/PLATELET
BAND NEUTROPHILS: 1 %
BASOS ABS: 0 10*3/uL (ref 0.0–0.1)
BASOS PCT: 0 %
Blasts: 0 %
EOS ABS: 0 10*3/uL (ref 0.0–0.7)
EOS PCT: 0 %
HCT: 30.5 % — ABNORMAL LOW (ref 36.0–46.0)
Hemoglobin: 9.8 g/dL — ABNORMAL LOW (ref 12.0–15.0)
LYMPHS ABS: 0.6 10*3/uL — AB (ref 0.7–4.0)
Lymphocytes Relative: 3 %
MCH: 29.6 pg (ref 26.0–34.0)
MCHC: 32.1 g/dL (ref 30.0–36.0)
MCV: 92.1 fL (ref 78.0–100.0)
METAMYELOCYTES PCT: 0 %
MONO ABS: 1.5 10*3/uL — AB (ref 0.1–1.0)
MYELOCYTES: 0 %
Monocytes Relative: 8 %
Neutro Abs: 16.5 10*3/uL — ABNORMAL HIGH (ref 1.7–7.7)
Neutrophils Relative %: 88 %
Other: 0 %
PLATELETS: 248 10*3/uL (ref 150–400)
Promyelocytes Absolute: 0 %
RBC: 3.31 MIL/uL — ABNORMAL LOW (ref 3.87–5.11)
RDW: 16.5 % — AB (ref 11.5–15.5)
WBC: 18.6 10*3/uL — ABNORMAL HIGH (ref 4.0–10.5)
nRBC: 0 /100 WBC

## 2017-11-11 LAB — POCT I-STAT 3, ART BLOOD GAS (G3+)
ACID-BASE EXCESS: 8 mmol/L — AB (ref 0.0–2.0)
ACID-BASE EXCESS: 10 mmol/L — AB (ref 0.0–2.0)
Bicarbonate: 36.6 mmol/L — ABNORMAL HIGH (ref 20.0–28.0)
Bicarbonate: 33.7 mmol/L — ABNORMAL HIGH (ref 20.0–28.0)
O2 SAT: 93 %
O2 SAT: 97 %
PH ART: 7.521 — AB (ref 7.350–7.450)
PO2 ART: 77 mmHg — AB (ref 83.0–108.0)
Patient temperature: 36
TCO2: 39 mmol/L — ABNORMAL HIGH (ref 22–32)
TCO2: 35 mmol/L — ABNORMAL HIGH (ref 22–32)
pCO2 arterial: 78.4 mmHg (ref 32.0–48.0)
pCO2 arterial: 40.8 mmHg (ref 32.0–48.0)
pH, Arterial: 7.276 — ABNORMAL LOW (ref 7.350–7.450)
pO2, Arterial: 80 mmHg — ABNORMAL LOW (ref 83.0–108.0)

## 2017-11-11 LAB — GLUCOSE, CAPILLARY
Glucose-Capillary: 138 mg/dL — ABNORMAL HIGH (ref 65–99)
Glucose-Capillary: 130 mg/dL — ABNORMAL HIGH (ref 65–99)

## 2017-11-11 LAB — BASIC METABOLIC PANEL
ANION GAP: 11 (ref 5–15)
BUN: 9 mg/dL (ref 6–20)
CALCIUM: 8 mg/dL — AB (ref 8.9–10.3)
CO2: 34 mmol/L — ABNORMAL HIGH (ref 22–32)
Chloride: 86 mmol/L — ABNORMAL LOW (ref 101–111)
Creatinine, Ser: 0.78 mg/dL (ref 0.44–1.00)
GFR calc Af Amer: >60 mL/min (ref >60)
GLUCOSE: 137 mg/dL — AB (ref 65–99)
Potassium: 4.1 mmol/L (ref 3.5–5.1)
SODIUM: 131 mmol/L — AB (ref 135–145)

## 2017-11-11 LAB — PROCALCITONIN: Procalcitonin: 1.01 ng/mL

## 2017-11-11 LAB — PHOSPHORUS: Phosphorus: 3.9 mg/dL (ref 2.5–4.6)

## 2017-11-11 LAB — MAGNESIUM: Magnesium: 1.9 mg/dL (ref 1.7–2.4)

## 2017-11-11 MED ORDER — FENTANYL CITRATE (PF) 100 MCG/2ML IJ SOLN
50.0000 ug | INTRAMUSCULAR | Status: DC | PRN
  Administered 2017-11-11: 50 ug via INTRAVENOUS
  Filled 2017-11-11: qty 2

## 2017-11-11 MED ORDER — LORAZEPAM 2 MG/ML IJ SOLN
1.0000 mg | INTRAMUSCULAR | Status: DC | PRN
  Administered 2017-11-11: 1 mg via INTRAVENOUS
  Filled 2017-11-11: qty 1

## 2017-11-11 MED ORDER — FENTANYL CITRATE (PF) 100 MCG/2ML IJ SOLN
INTRAMUSCULAR | Status: AC
  Administered 2017-11-11: 100 ug via INTRAVENOUS
  Filled 2017-11-11: qty 2

## 2017-11-11 MED ORDER — MIDAZOLAM HCL 2 MG/2ML IJ SOLN
INTRAMUSCULAR | Status: AC
  Administered 2017-11-11: 2 mg via INTRAVENOUS
  Filled 2017-11-11: qty 2

## 2017-11-11 MED ORDER — FENTANYL CITRATE (PF) 100 MCG/2ML IJ SOLN
50.0000 ug | INTRAMUSCULAR | Status: DC | PRN
  Filled 2017-11-11: qty 2

## 2017-11-11 MED ORDER — ORAL CARE MOUTH RINSE: 15.0000 mL | OROMUCOSAL | Status: DC

## 2017-11-11 MED ORDER — ONDANSETRON HCL 4 MG/2ML IJ SOLN
4.0000 mg | Freq: Once | INTRAMUSCULAR | Status: AC
  Administered 2017-11-11: 4 mg via INTRAVENOUS

## 2017-11-11 MED ORDER — ETOMIDATE 2 MG/ML IV SOLN
20.0000 mg | Freq: Once | INTRAVENOUS | Status: AC
  Administered 2017-11-11: 20 mg via INTRAVENOUS

## 2017-11-11 MED ORDER — MIDAZOLAM HCL 2 MG/2ML IJ SOLN
2.0000 mg | Freq: Once | INTRAMUSCULAR | Status: AC
  Administered 2017-11-11: 2 mg via INTRAVENOUS

## 2017-11-11 MED ORDER — FENTANYL CITRATE (PF) 100 MCG/2ML IJ SOLN
50.0000 ug | INTRAMUSCULAR | Status: DC | PRN
  Administered 2017-11-11: 100 ug via INTRAVENOUS

## 2017-11-11 MED ORDER — MIDAZOLAM HCL 2 MG/2ML IJ SOLN: 1.0000 mg | INTRAMUSCULAR | Status: DC | PRN

## 2017-11-11 MED ORDER — GLYCOPYRROLATE 0.2 MG/ML IJ SOLN: 0.2000 mg | INTRAMUSCULAR | Status: DC | PRN

## 2017-11-11 MED ORDER — FENTANYL CITRATE (PF) 100 MCG/2ML IJ SOLN
100.0000 ug | Freq: Once | INTRAMUSCULAR | Status: AC
  Administered 2017-11-11: 100 ug via INTRAVENOUS

## 2017-11-12 ENCOUNTER — Telehealth (INDEPENDENT_AMBULATORY_CARE_PROVIDER_SITE_OTHER): Payer: Self-pay | Admitting: Orthopaedic Surgery

## 2017-11-12 NOTE — Telephone Encounter (Signed)
Patients daughter called to cancel her last appointment with Dr. Ninfa Linden because patient has passed. But she just wanted to let Dr. Ninfa Linden know how happy her mother was with the job he did on her hip and that she was able to live pain free and use it and dance like she used to. So she wanted to say thank you.

## 2017-11-13 ENCOUNTER — Telehealth: Payer: Self-pay

## 2017-11-13 ENCOUNTER — Encounter (HOSPITAL_COMMUNITY): Payer: Medicare Other | Admitting: Internal Medicine

## 2017-11-13 NOTE — Telephone Encounter (Signed)
On 11/13/17 I received a d/c from [employer] (original). The d/c is for burial. The patient is a patient of Doctor Aljishi. The d/c will be taken to [facility] ICU for signature.  On 11/13/17 I received the d/c back from Doctor Halford Chessman who signed the d/c for Doctor Aljishi. I got the d/c ready and called the funeral home to let them know the d/c is ready for pickup.

## 2017-11-14 ENCOUNTER — Institutional Professional Consult (permissible substitution): Payer: Medicare Other | Admitting: Cardiovascular Disease

## 2017-11-15 LAB — CULTURE, BLOOD (ROUTINE X 2)
Culture: NO GROWTH
Culture: NO GROWTH
SPECIAL REQUESTS: ADEQUATE
SPECIAL REQUESTS: ADEQUATE

## 2017-11-19 ENCOUNTER — Ambulatory Visit (INDEPENDENT_AMBULATORY_CARE_PROVIDER_SITE_OTHER): Payer: Medicare Other | Admitting: Orthopaedic Surgery

## 2017-12-10 NOTE — Discharge Summary (Signed)
Patient was admitted to the hospital on 11/05/2017 This is a 84 year old with congestive heart failure attributable to mitral valve regurgitation who is suffered from out of hospital cardiac arrest.  She was last seen normal last night and was unresponsive when morning pills were brought to her in a skilled nursing facility.  It is not clear when CPR was initiated and there was a 30-minute lag between the call and the arrival of EMS.  When EMS arrived she was asystolic.  She has received 2 doses of epinephrine and was placed on an epinephrine drip with return of spontaneous circulation.  She was last seen on this hospital earlier this month when she was admitted with dyspnea and an echocardiogram showed an ejection fraction of 60-65% with moderate to severe mitral regurgitation.  It appears she has been undergoing aggressive diuresis.  Subsequent to discharge she had some difficulties with cough and was placed on Levaquin and was placed in rehabilitation.  He has had some nausea this week no chest pain. Patient remained on the ventilator but was fully recovered neurologically. She was extubated on 11/10/2017 with very labile blood pressure requiring levophed to Cardene drips. She also went into another flash pulmonary edema after extubation twice with worsening in her neurological status. daughter was at bedside and she decided that this is not what the patient would want and she just wanted to keep her alive until all family arrive. Patient was reintubated on Nov 20, 2017 and palliative care consulted and a decision was made to withdraw care. Patient dies on 2017-11-21   Please refer to notes for further details

## 2017-12-10 NOTE — Progress Notes (Addendum)
eLink Physician-Brief Progress Note Patient Name: [name] DOB: 20-Nov-1920 MRN: [id number]   Date of Service  11/18/17  HPI/Events of Note  Nausea - Not relieved by Zofran. Allergies to Compazine and Phenergan.   eICU Interventions  Will order Zofran 4 mg IV X 1 now (extra dose).      Intervention Category Major Interventions: Other:  [name] 11-18-2017, 5:06 AM

## 2017-12-10 NOTE — Procedures (Signed)
Extubation Procedure Note  Patient Details:   Name: GEARLDEAN LOMANTO DOB: 1921/02/17 MRN: [id number]   Airway Documentation:     Evaluation  O2 sats: stable throughout Complications: No apparent complications Patient did tolerate procedure well. Bilateral Breath Sounds: Rhonchi   Yes  Pt extubated per MD terminally with family at bedside. NARD VSS. 2L [place] placed.   Lottie Rater 11/14/2017, 2:35 PM

## 2017-12-10 NOTE — Procedures (Signed)
Intubation Procedure Note [name] [id number] 07/28/1921  Procedure: Intubation Indications: Respiratory insufficiency  Procedure Details Consent: Risks of procedure as well as the alternatives and risks of each were explained to the (patient/caregiver).  Consent for procedure obtained. Time Out: Verified patient identification, verified procedure, site/side was marked, verified correct patient position, special equipment/implants available, medications/allergies/relevent history reviewed, required imaging and test results available.  Performed  MAC and 2 Medications:  Fentanyl 100 mcg Etomidate 20 mg Versed 2 mg NMB not needed   Evaluation Hemodynamic Status: BP stable throughout; O2 sats: stable throughout Patient's Current Condition: stable Complications: No apparent complications Patient did tolerate procedure well. Chest X-ray ordered to verify placement.  CXR: pending.   [name] ACNP [name] PCCM Pager [phone] till 3 pm If no answer page [phone] November 15, 2017, 10:03 AM

## 2017-12-10 NOTE — Progress Notes (Signed)
Daily Progress Note   Patient Name: [name]       Date: November 14, 2017 DOB: 17-Jun-1921  Age: 84 y.o. MRN#: [id number] Attending Physician: Sampson Goon, MD Primary Care Physician: [name], NP Admit Date: 10/30/2017  Reason for Consultation/Follow-up: Establishing goals of care  Subjective: [name] has declined this morning and was reintubated. Family at bedside.   Length of Stay: 2  Current Medications: Scheduled Meds:  . chlorhexidine gluconate (MEDLINE KIT)  15 mL Mouth Rinse BID  . Chlorhexidine Gluconate Cloth  6 each Topical Daily  . mouth rinse  15 mL Mouth Rinse Q4H  . pantoprazole (PROTONIX) IV  40 mg Intravenous QHS  . sodium chloride flush  10-40 mL Intracatheter Q12H    Continuous Infusions: . sodium chloride    . potassium chloride      PRN Meds: acetaminophen (TYLENOL) oral liquid 160 mg/5 mL, fentaNYL (SUBLIMAZE) injection, glycopyrrolate, LORazepam, metoprolol tartrate, ondansetron (ZOFRAN) IV, sodium chloride flush  Physical Exam  Constitutional: She appears well-developed. She appears lethargic. She is intubated.  HENT:  Head: Normocephalic and atraumatic.  Cardiovascular: Tachycardia present.  Pulmonary/Chest: She is intubated.  Abdominal: Normal appearance.  Neurological: She appears lethargic.  Nursing note and vitals reviewed.           Vital Signs: BP (!) 130/59   Pulse 63   Temp 97.7 F (36.5 C) (Core)   Resp (!) 24   Ht '5\' 7"'  (1.702 m)   Wt 59.9 kg (132 lb 0.9 oz)   SpO2 100%   BMI 20.68 kg/m  SpO2: SpO2: 100 % O2 Device: O2 Device: Ventilator O2 Flow Rate: O2 Flow Rate (L/min): 11 L/min  Intake/output summary:   Intake/Output Summary (Last 24 hours) at 2017-11-14 1351 Last data filed at 11/14/2017 1200 Gross per 24 hour    Intake 2359.2 ml  Output 1170 ml  Net 1189.2 ml   LBM: Last BM Date: (PTA) Baseline Weight: Weight: 59.9 kg (132 lb 0.9 oz) Most recent weight: Weight: 59.9 kg (132 lb 0.9 oz)       Palliative Assessment/Data: 20%      Patient Active Problem List   Diagnosis Date Noted  . Cardiogenic shock ([place])   . Goals of care, counseling/discussion   . Palliative care encounter   . Cardiac arrest ([place])  10/30/2017  . Acute respiratory failure with hypoxia and hypercapnia ([place]) 10/25/2017  . Acute diastolic congestive heart failure ([place]) 10/25/2017  . Mitral regurgitation 10/25/2017  . Pulmonary hypertension, primary ([place]) 10/25/2017  . URI (upper respiratory infection) 10/25/2017  . Anemia, iron deficiency 10/25/2017  . Type 2 diabetes mellitus with diabetic neuropathy, unspecified ([place]) 10/25/2017  . Thrombocytopenia ([place]) 10/25/2017  . Acute respiratory failure ([place]) 10/17/2017  . Status post total replacement of right hip 07/11/2017  . Pain of right hip joint 05/05/2017  . DOE (dyspnea on exertion) 02/21/2017  . Asthma, chronic, moderate persistent, uncomplicated 82/88/3374  . Chronic respiratory failure with hypoxia and hypercapnia ([place]) 02/21/2017  . Hypertensive urgency 01/30/2017  . Dizziness 01/30/2017  . Hyponatremia with extracellular fluid depletion 01/30/2017  . Hyponatremia 01/30/2017  . Lumbar spondylosis 01/28/2017  . Unilateral primary osteoarthritis, right hip 01/28/2017  . Arthritis   . Asthma   . Cataract   . Colon cancer ([place])   . COPD (chronic obstructive pulmonary disease) ([place])   . Diabetes mellitus without complication ([place])   . Diverticulitis   . DVT (deep vein thrombosis) in pregnancy ([place])   . Essential hypertension   . GERD (gastroesophageal reflux disease)   . Hearing impaired   . Hyperlipidemia   . Hypertension   . TIA (transient ischemic attack)   . Seasonal allergies     Palliative Care Assessment & Plan   HPI: 84 y.o. female  with  past medical history of asthma, colon cancer, diabetes mellitus, diverticulosis, s/p DVT, GERD, kidney stones, HTN, PONV admitted on 10/20/2017 with out of hospital cardiac arrest found asystole by EMS at [facility]. Had been having issues with diuresis and hyponatremia noted at SNF prior to arrest. Was recently admitted 3/8-3/13 when she was diagnosed with diastolic CHF with severe mitral regurgitation and pulmonary hypertension and was intubated briefly during this admission. Acute arrest and decline appear to be have cardiac etiology.  Assessment: All [name]'s children and their spouses as well as her pastor are at bedside. They have all had a chance to say their goodbyes and they all believe that [name] is ready to pass. [name] was able to nod head yes/no to family prior to extubation. Discussed plan with Dr. Elwyn Reach. Extubated to comfort with help of RN and RT. Stayed with family and explained transition at EOL and signs of discomfort. Explained that I did not believe that she will survive long. Offered support as they shared fond memories of their mother and the amazing women she has always been. Support provided as she passed away peacefully with family at bedside. Condolences given.   Recommendations/Plan:   RN to give fentanyl 100 mcg and ativan 1 mg IV and will extubate to comfort.   PRN medications available to ensure comfort.   Goals of Care and Additional Recommendations:  Limitations on Scope of Treatment: Full Comfort Care  Code Status:  DNR  Prognosis:   Hours - Days  Discharge Planning:  Anticipated Hospital Death  Care plan was discussed with Dr. Elwyn Reach, RN, RT.   Thank you for allowing the Palliative Medicine Team to assist in the care of this patient.   Total Time 60 min Prolonged Time Billed  no       Greater than 50%  of this time was spent counseling and coordinating care related to the above assessment and plan.  Vinie Sill, NP Palliative Medicine  Team Pager # [phone] (M-F 8a-5p) Team Phone # [phone] (Nights/Weekends)

## 2017-12-10 NOTE — Progress Notes (Signed)
terminally extubated per MD, withdrawal of care

## 2017-12-10 NOTE — Progress Notes (Signed)
Continuing to support this family that I met over the weekend.  Family rallying around their mother and loved one as they prepare to terminally extubate sometime this afternoon.  Ministry of presence and active supportive listening for family.  Families minister present.    November 21, 2017 1202  Clinical Encounter Type  Visited With Patient and family together;Health care provider  Visit Type Initial;Follow-up;Spiritual support;Critical Care  Spiritual Encounters  Spiritual Needs Prayer;Emotional

## 2017-12-10 NOTE — Progress Notes (Signed)
   We received a request for cardiology consult. When I arrived to the room the Palliative Care NP tells me the family is withdrawing care. IV lines are being removed and the patient will be extubated. The NP says that cardiology consult is no longer needed.  Please call us if the situation changes and cardiology consult is needed.   Daune Perch, AGNP-C [facility] 11-29-17  1:09 PM Pager: [phone]

## 2017-12-10 NOTE — Progress Notes (Addendum)
PULMONARY / CRITICAL CARE MEDICINE   Name: [name] MRN: [id number] DOB: [birth year]    ADMISSION DATE:  11/06/2017  CHIEF COMPLAINT:  Out of Hospital Cardiac Arrest  HISTORY OF PRESENT ILLNESS:        This is a 84 year old with congestive heart failure attributable to mitral valve regurgitation who is suffered from out of hospital cardiac arrest.  She was last seen normal last night and was unresponsive when morning pills were brought to her in a skilled nursing facility.  It is not clear when CPR was initiated and there was a 30-minute lag between the call and the arrival of EMS.  When EMS arrived she was asystolic.  She has received 2 doses of epinephrine and was placed on an epinephrine drip with return of spontaneous circulation.  She was last seen on this hospital earlier this month when she was admitted with dyspnea and an echocardiogram showed an ejection fraction of 60-65% with moderate to severe mitral regurgitation.  It appears she has been undergoing aggressive diuresis.  Subsequent to discharge she had some difficulties with cough and was placed on Levaquin and was placed in rehabilitation.  He has had some nausea this week no chest pain.      SUBJECTIVE:  Awake alert remains on high flow oxygen.  Note at 0 957 she felt respiratory distress and required reintubation.  VITAL SIGNS: BP (!) 126/44   Pulse 88   Temp 98.1 F (36.7 C)   Resp (!) 24   Ht 5\' 7"  (1.702 m)   Wt 59.9 kg (132 lb 0.9 oz)   SpO2 90%   BMI 20.68 kg/m   HEMODYNAMICS:    VENTILATOR SETTINGS: Vent Mode: PSV;CPAP FiO2 (%):  [36 %-40 %] 36 % PEEP:  [5 cmH20] 5 cmH20 Pressure Support:  [10 cmH20-12 cmH20] 10 cmH20  INTAKE / OUTPUT: I/O last 3 completed shifts: In: 3242.9 [I.V.:2892.9; IV Piggyback:350] Out: 2320 [Urine:2120; Emesis/NG output:200]  PHYSICAL EXAMINATION: General: Elderly female no acute distress HEENT: JVD is appreciated PSY: Dull effect Neuro: Intact CV: Heart sounds  regular sinus rhythm being monitored PULM: Decreased air movement GI soft positive bowel sounds Extremities: warm/dry, 1+ edema  Skin: no rashes or lesions    LABS:  BMET Recent Labs  Lab 11/10/17 1158 11/10/17 1805 11-27-2017 0422  NA 130* 130* 131*  K 4.0 3.9 4.1  CL 86* 84* 86*  CO2 33* 33* 34*  BUN 8 9 9   CREATININE 0.68 0.74 0.78  GLUCOSE 130* 175* 137*    Electrolytes Recent Labs  Lab 11/10/17 0358 11/10/17 1158 11/10/17 1805 27-Nov-2017 0422  CALCIUM 8.0* 8.3* 7.9* 8.0*  MG  --   --  1.4* 1.9  PHOS 1.5*  --   --  3.9    CBC Recent Labs  Lab 11/02/2017 1353 11/10/17 0358 11/27/17 0422  WBC 28.2* 12.7* 18.6*  HGB 9.5* 8.8* 9.8*  HCT 29.2* 26.1* 30.5*  PLT 246 192 248    Coag's Recent Labs  Lab 11/06/2017 1353  APTT 27  INR 1.10    Sepsis Markers Recent Labs  Lab 11/07/2017 1353 10/22/2017 1354 11/10/17 0153 11/10/17 0358 11-27-2017 0422  LATICACIDVEN  --  5.4* 0.8  --   --   PROCALCITON 0.61  --   --  1.48 1.01    ABG Recent Labs  Lab 10/23/2017 1021 11/10/17 0355 11/10/17 1011  PHART 7.491* 7.508* 7.433  PCO2ART 48.4* 48.9* 55.4*  PO2ART 213.0* 104 91.0    Liver  Enzymes No results for input(s): AST, ALT, ALKPHOS, BILITOT, ALBUMIN in the last 168 hours.  Cardiac Enzymes Recent Labs  Lab 11/10/17 0358 11/10/17 1004 11/10/17 1610  TROPONINI 0.66* 0.48* 0.31*    Glucose Recent Labs  Lab 11/10/17 1155 11/10/17 1556 11/10/17 2002 11/10/17 2325 November 22, 2017 0337 11/22/17 0753  GLUCAP 114* 141* 127* 151* 138* 130*    Imaging Dg Chest Port 1 View  Result Date: 11-22-17 CLINICAL DATA:  Respiratory failure Hx of asthma, DM, HTN, TIA EXAM: PORTABLE CHEST 1 VIEW COMPARISON:  10/12/2017. FINDINGS: Cardiomegaly. Diffuse BILATERAL pulmonary opacities consistent with pulmonary edema appear increased. BILATERAL effusions are increased. Unchanged LEFT subclavian central venous line. No pneumothorax. IMPRESSION: Worsening aeration.  Worsening  edema.  Cardiomegaly. Electronically Signed   By: Staci Righter M.D.   On: 11-22-2017 07:54      DISCUSSION:      This is a 84 year old who is suffered from an out of hospital cardiac arrest.  In light of the patient's good functional status family feels it is appropriate to support aggressively.  She has a history of CHF attributable to mitral regurgitation and is profoundly hyponatremic on arrival.  Extubated and requiring high flow oxygen but otherwise stable.  Pressors are weaning.  Ongoing discussion with family as to [place].  She required reintubation on November 22, 2017 at 0 957     ASSESSMENT / PLAN:  PULMONARY A: Status post intubation and extubation Hypoxia P:   Wean oxygen as tolerated She is a full code requires intubation if needed She required reintubation at 0957 on 2017-11-22  CARDIOVASCULAR A:  Congestive heart failure Status post cardiac arrest Cardiogenic shock Hypertension Atrial fibrillation with ventricular response P:  Amiodarone Pressors as needed Diuresis as tolerated Cardiac pain drip if needed  RENAL Lab Results  Component Value Date   CREATININE 0.78 11-22-17   CREATININE 0.74 11/10/2017   CREATININE 0.68 11/10/2017   CREATININE 0.86 10/27/2017   Recent Labs  Lab 11/10/17 1158 11/10/17 1805 11-22-2017 0422  K 4.0 3.9 4.1   Recent Labs  Lab 11/10/17 1158 11/10/17 1805 November 22, 2017 0422  NA 130* 130* 131*    A:   Hyponatremia P:   Follow electrolytes  GASTROINTESTINAL A:   GI protection P:   Swallow evaluation prior to take  HEMATOLOGIC A:   DVT prophylaxis P:  Heparin  INFECTIOUS A:   Empirical antibiotic P:   Continue antibiotic  ENDOCRINE A:   Hyperglycemia P:   Scale insulin as needed  NEUROLOGIC A:   Extremely hard of hearing but neurologically intact P:   RASS goal: 0 Sedation for tube tolerance   FAMILY  - Updates: Patient updated at bedside.  -Ongoing discussion with family concerning withdrawal  of care    [facility] Oluwatobi Ruppe ACNP Maryanna Shape PCCM Pager [phone] till 1 pm If no answer page [phone] 2017/11/22, 10:02 AM

## 2017-12-10 DEATH — deceased

## 2017-12-23 ENCOUNTER — Ambulatory Visit: Payer: Medicare Other | Admitting: Cardiovascular Disease

## 2018-10-15 IMAGING — DX DG PORTABLE PELVIS
1 series · 1 of 1 positions shown · non-contrast
Comparison: 01/30/2017

CLINICAL DATA: Total hip replacement

EXAM:
PORTABLE PELVIS 1-2 VIEWS

[pelvis ap]
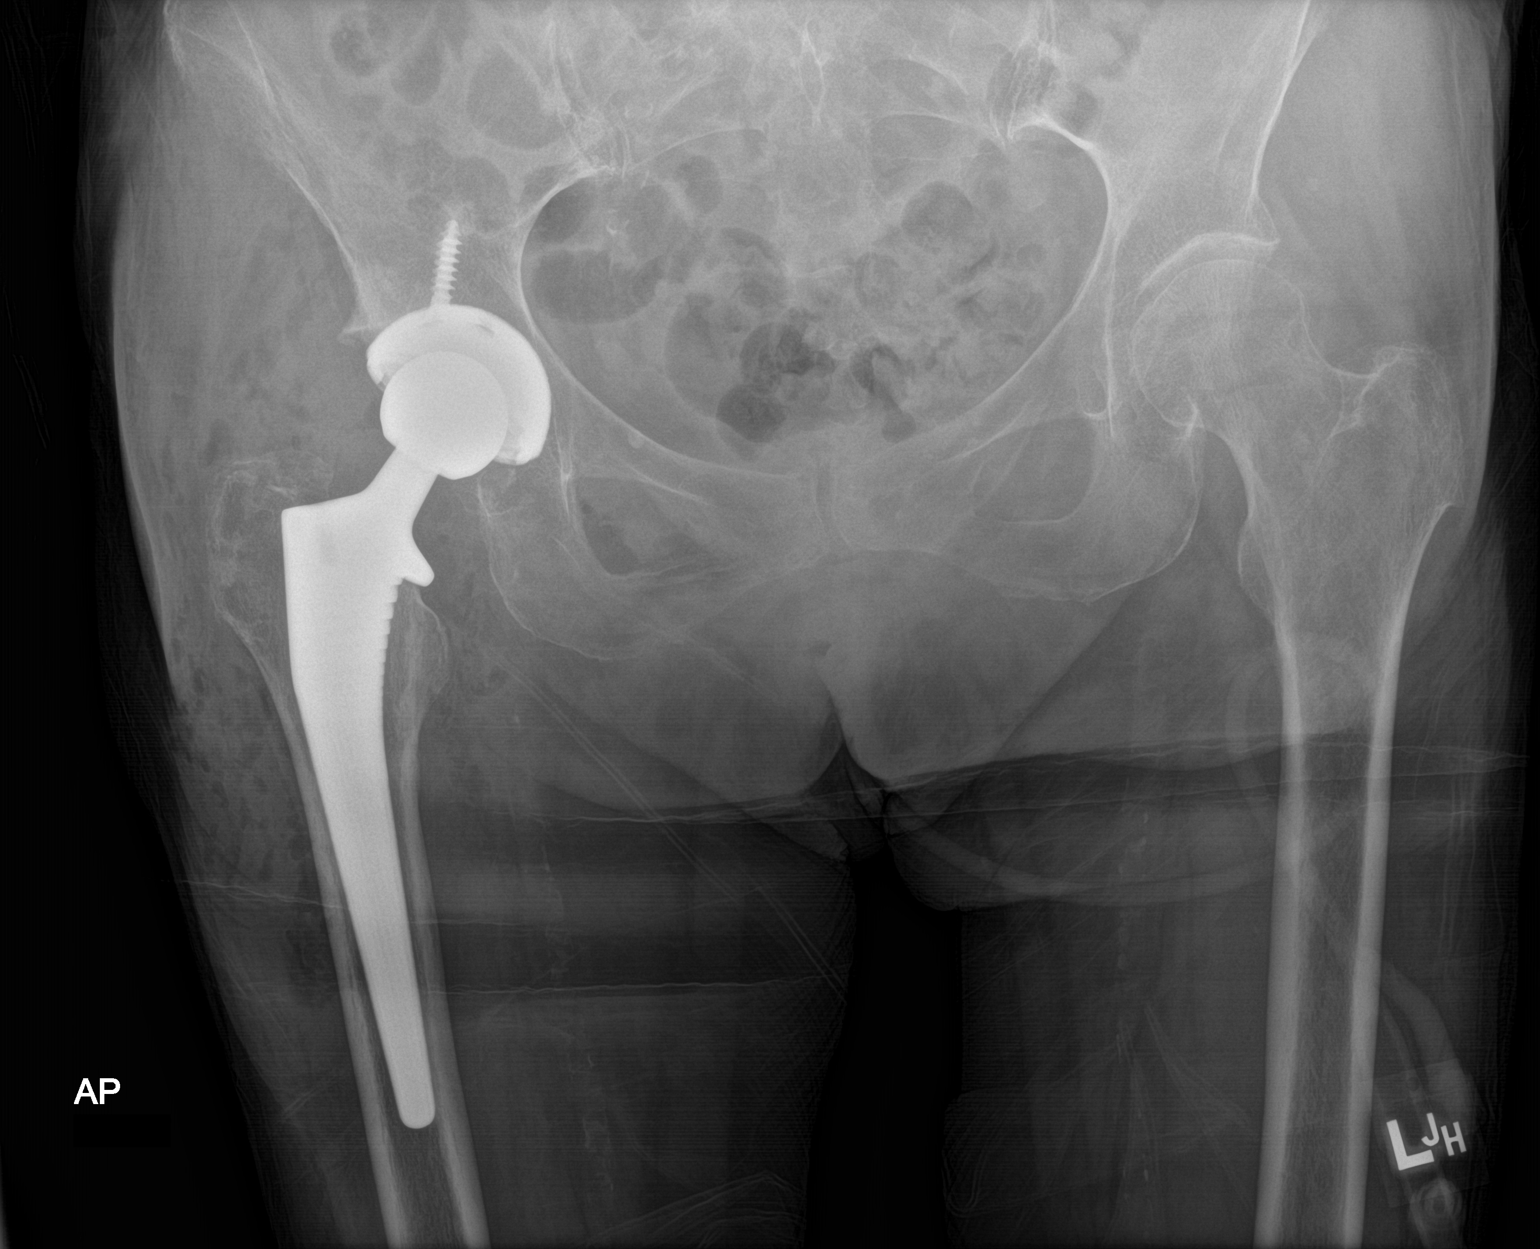

[1 of 1 positions shown; findings below may reference images not displayed]

FINDINGS: Changes of right hip replacement. Normal AP alignment. No hardware
or bony complicating feature noted.
IMPRESSION: Right hip replacement.  No visible complicating feature.

## 2019-01-22 IMAGING — DX DG CHEST 1V PORT
1 series · 1 of 1 positions shown · non-contrast
Comparison: 10/16/2017

CLINICAL DATA: Respiratory failure.

EXAM:
PORTABLE CHEST 1 VIEW

[chest ap]
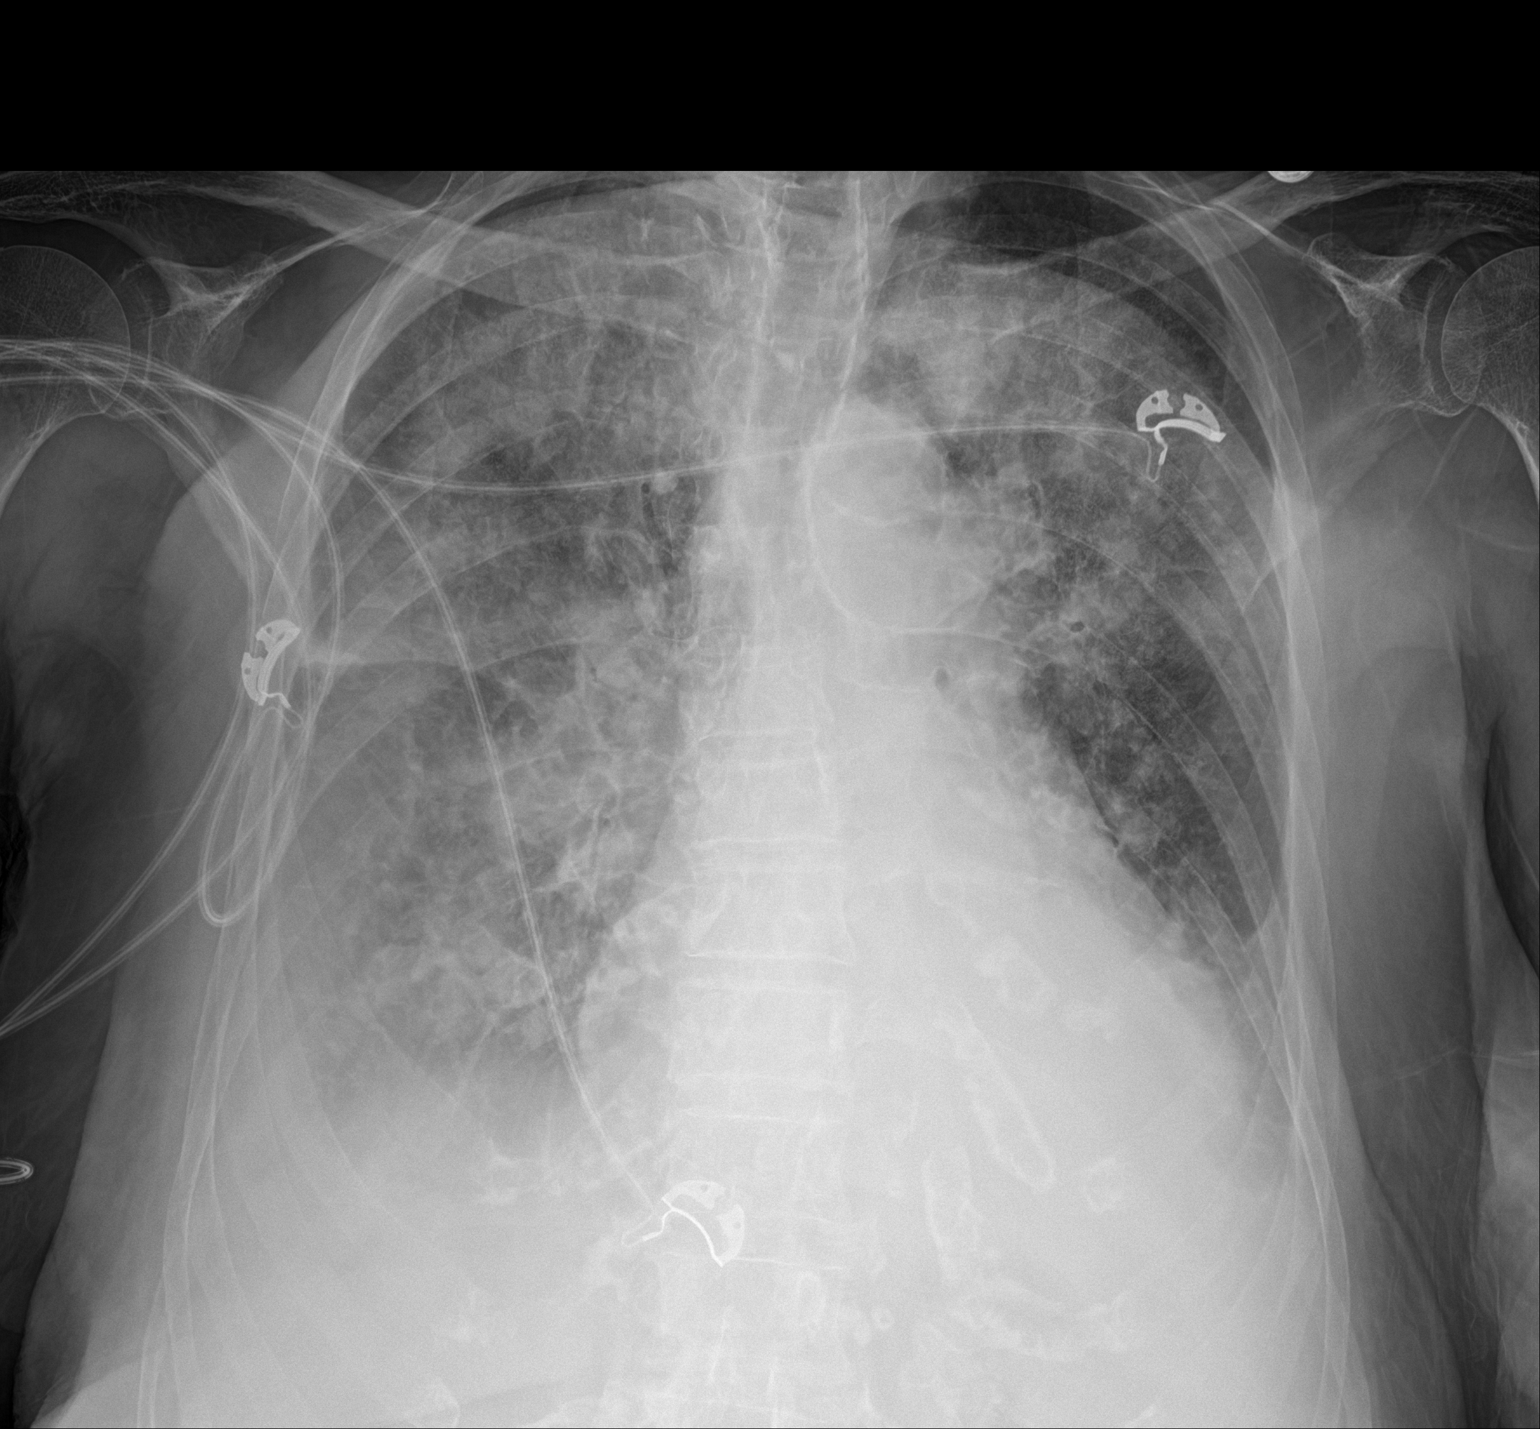

[1 of 1 positions shown; findings below may reference images not displayed]

FINDINGS: Endotracheal and enteric tubes have been removed. The cardiac
silhouette remains mildly enlarged. Aortic atherosclerosis is noted.
Veiling opacities in the right greater than left lung bases obscure
the hemidiaphragms and have increased from prior. There is pulmonary
vascular congestion with diffuse interstitial and patchy airspace
opacities in both lungs, similar to the prior study. No pneumothorax
is identified.
IMPRESSION: 1. Interval extubation.
2. Increasing bilateral pleural effusions.
3. Similar diffuse interstitial and patchy airspace opacities in
both lungs which may reflect edema or pneumonia.

## 2019-02-15 IMAGING — DX DG CHEST 1V PORT
1 series · 1 of 1 positions shown · non-contrast
Comparison: 11/09/2017.

CLINICAL DATA: Respiratory failure Hx of asthma, DM, HTN, TIA

EXAM:
PORTABLE CHEST 1 VIEW

[chest]
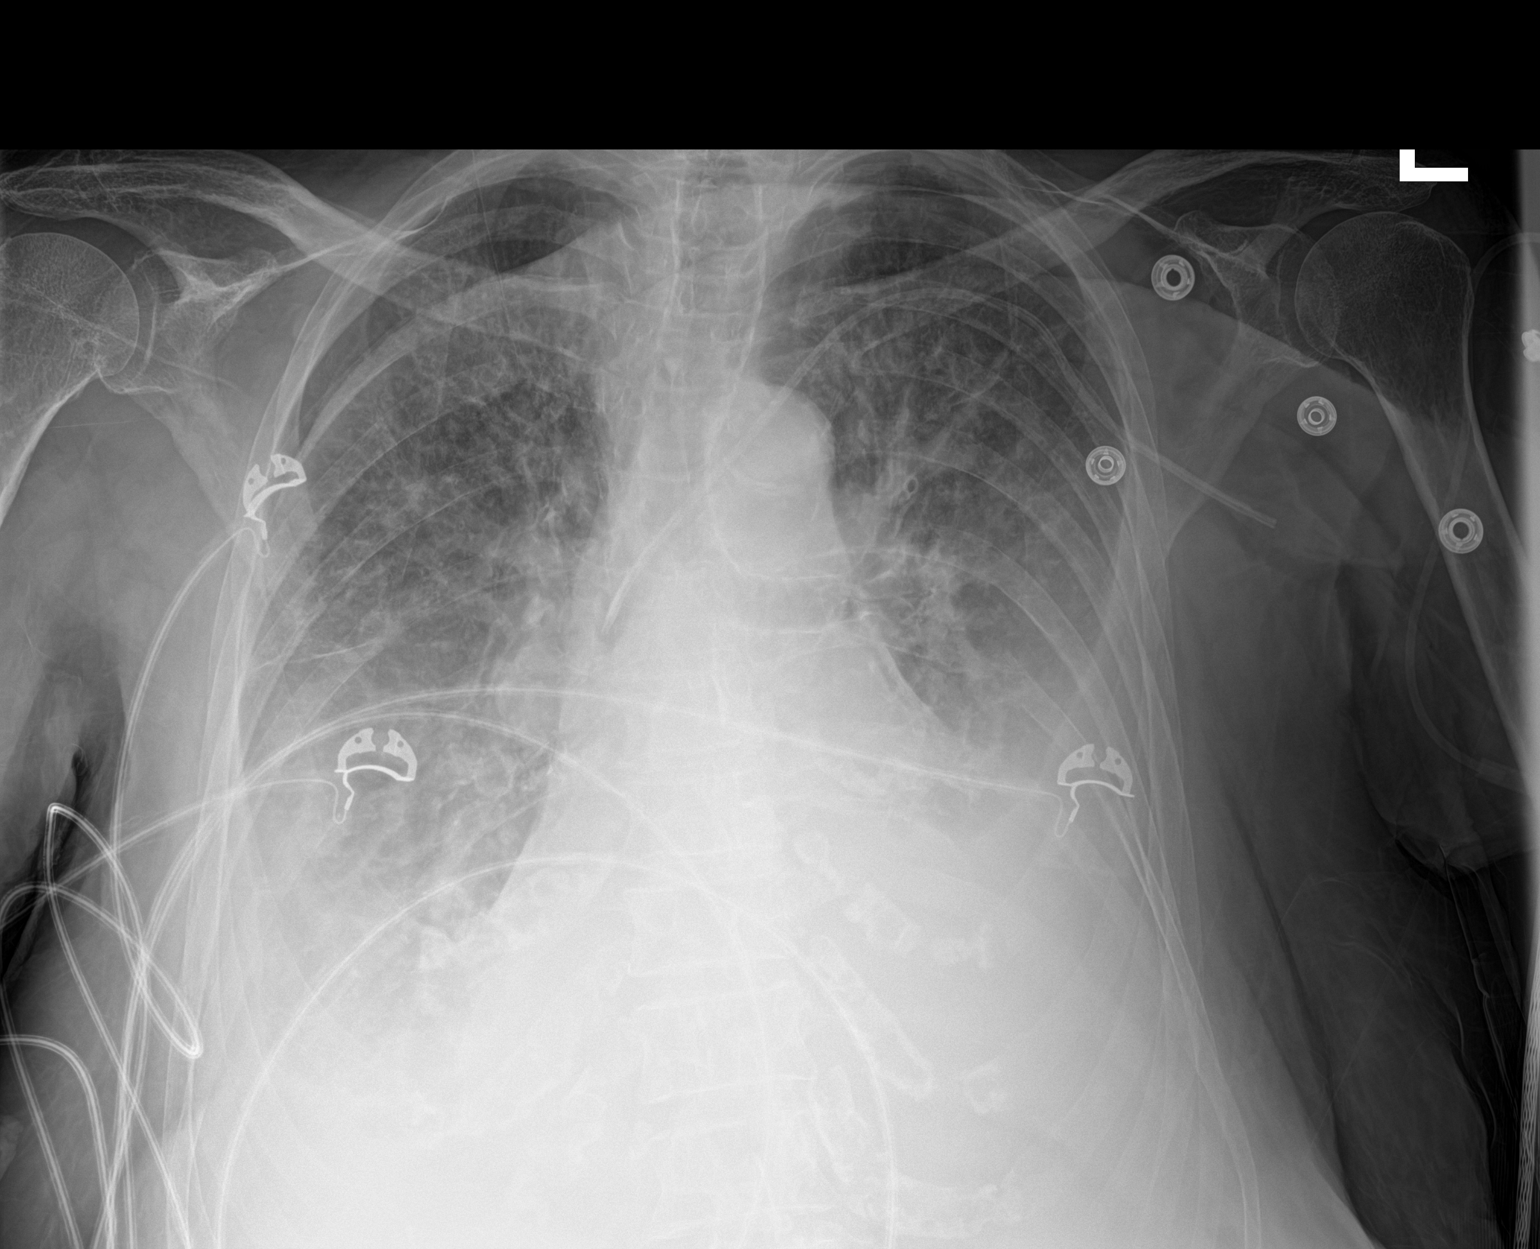

[1 of 1 positions shown; findings below may reference images not displayed]

FINDINGS: Cardiomegaly. Diffuse BILATERAL pulmonary opacities consistent with
pulmonary edema appear increased. BILATERAL effusions are increased.
Unchanged LEFT subclavian central venous line. No pneumothorax.
IMPRESSION: Worsening aeration.  Worsening edema.  Cardiomegaly.

## 2019-02-15 IMAGING — DX DG CHEST 1V PORT
1 series · 1 of 1 positions shown · non-contrast
Comparison: Portable exam 1996 hours compared to 5421 hours

CLINICAL DATA: Respiratory failure, re-intubation

EXAM:
PORTABLE CHEST 1 VIEW

[chest ap]
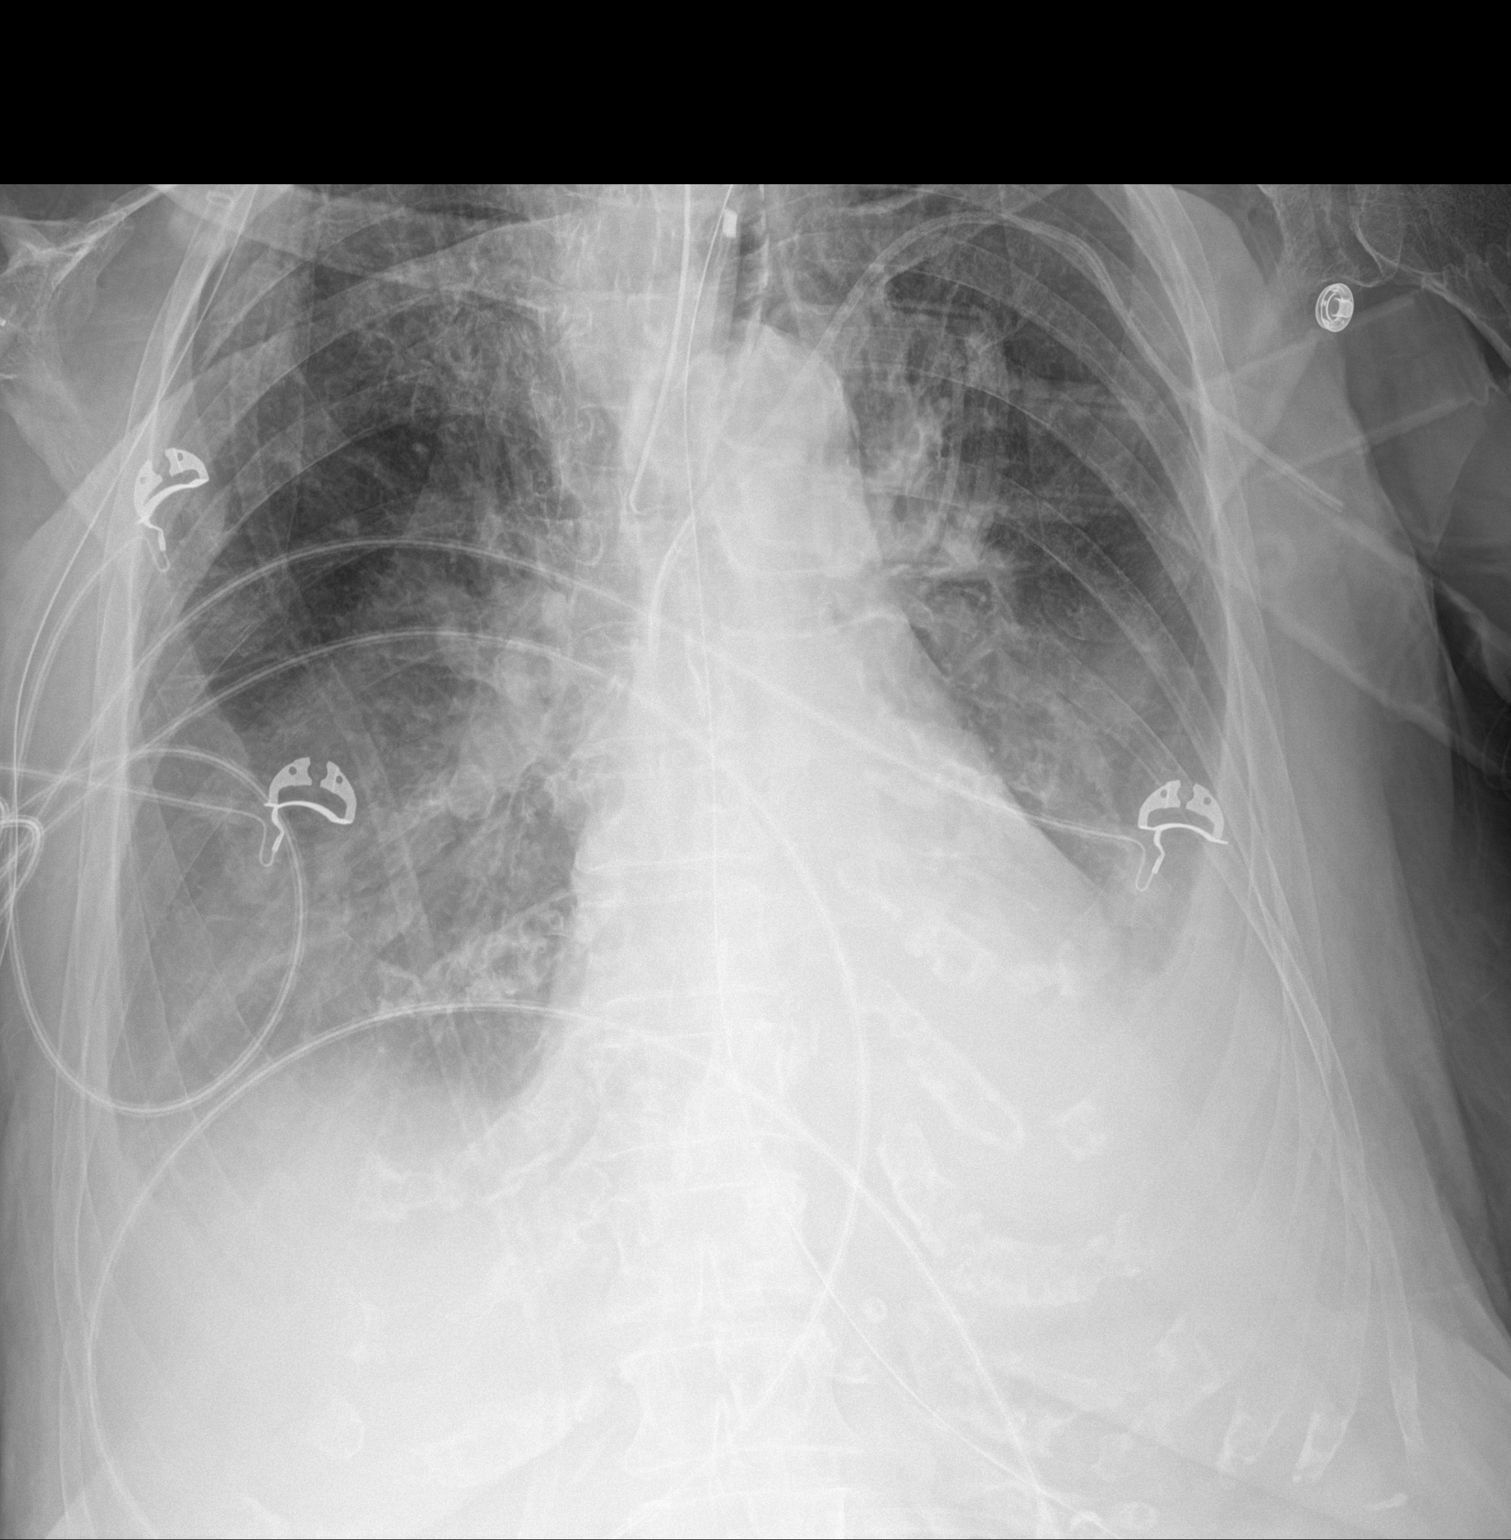

[1 of 1 positions shown; findings below may reference images not displayed]

FINDINGS: Tip of endotracheal tube projects 13 mm above carina.

Tip of LEFT subclavian central venous catheter projects over SVC.

Nasogastric tube extends into stomach.

Stable heart size mediastinal contours.

Bibasilar effusions and atelectasis.

BILATERAL pulmonary infiltrates likely pulmonary edema, probably
little changed when accounting for differences in technique.

No pneumothorax.

Bones demineralized.
IMPRESSION: Persistent pulmonary edema with bibasilar effusions and atelectasis.

Tip of endotracheal tube projects 13 mm above carina.
